# Patient Record
Sex: Male | Born: 1965
Health system: Southern US, Community
[De-identification: ages and names within clinical notes are randomized; demographics above are authoritative.]

## PROBLEM LIST (undated history)

## (undated) DIAGNOSIS — R51 Headache: Secondary | ICD-10-CM

## (undated) DIAGNOSIS — I428 Other cardiomyopathies: Secondary | ICD-10-CM

## (undated) DIAGNOSIS — I10 Essential (primary) hypertension: Secondary | ICD-10-CM

## (undated) DIAGNOSIS — K649 Unspecified hemorrhoids: Secondary | ICD-10-CM

## (undated) DIAGNOSIS — I509 Heart failure, unspecified: Secondary | ICD-10-CM

## (undated) DIAGNOSIS — E785 Hyperlipidemia, unspecified: Secondary | ICD-10-CM

## (undated) DIAGNOSIS — D649 Anemia, unspecified: Secondary | ICD-10-CM

## (undated) HISTORY — PX: HEMORRHOID SURGERY: SHX153

## (undated) HISTORY — DX: Headache: R51

## (undated) HISTORY — DX: Essential (primary) hypertension: I10

## (undated) HISTORY — DX: Hyperlipidemia, unspecified: E78.5

## (undated) HISTORY — DX: Anemia, unspecified: D64.9

---

## 1998-07-28 ENCOUNTER — Encounter: Admission: RE | Admit: 1998-07-28 | Discharge: 1998-07-28 | Payer: Self-pay | Admitting: *Deleted

## 2003-12-27 ENCOUNTER — Ambulatory Visit (HOSPITAL_BASED_OUTPATIENT_CLINIC_OR_DEPARTMENT_OTHER): Admission: RE | Admit: 2003-12-27 | Discharge: 2003-12-27 | Payer: Self-pay | Admitting: Surgery

## 2007-08-13 ENCOUNTER — Emergency Department (HOSPITAL_COMMUNITY): Admission: EM | Admit: 2007-08-13 | Discharge: 2007-08-13 | Payer: Self-pay | Admitting: Emergency Medicine

## 2009-12-05 ENCOUNTER — Ambulatory Visit: Payer: Self-pay | Admitting: Internal Medicine

## 2009-12-05 DIAGNOSIS — D649 Anemia, unspecified: Secondary | ICD-10-CM

## 2009-12-05 DIAGNOSIS — R519 Headache, unspecified: Secondary | ICD-10-CM | POA: Insufficient documentation

## 2009-12-05 DIAGNOSIS — R51 Headache: Secondary | ICD-10-CM

## 2009-12-05 DIAGNOSIS — I10 Essential (primary) hypertension: Secondary | ICD-10-CM | POA: Insufficient documentation

## 2009-12-05 HISTORY — DX: Headache: R51

## 2009-12-05 HISTORY — DX: Anemia, unspecified: D64.9

## 2009-12-05 HISTORY — DX: Essential (primary) hypertension: I10

## 2009-12-19 ENCOUNTER — Ambulatory Visit: Payer: Self-pay | Admitting: Internal Medicine

## 2010-01-09 ENCOUNTER — Ambulatory Visit: Payer: Self-pay | Admitting: Internal Medicine

## 2010-01-09 DIAGNOSIS — E785 Hyperlipidemia, unspecified: Secondary | ICD-10-CM

## 2010-01-09 HISTORY — DX: Hyperlipidemia, unspecified: E78.5

## 2010-01-09 LAB — CONVERTED CEMR LAB
BUN: 16 mg/dL (ref 6–23)
CO2: 28 meq/L (ref 19–32)
Chloride: 108 meq/L (ref 96–112)
Glucose, Bld: 102 mg/dL — ABNORMAL HIGH (ref 70–99)
Potassium: 4.4 meq/L (ref 3.5–5.1)
Sodium: 142 meq/L (ref 135–145)

## 2010-02-28 ENCOUNTER — Telehealth: Payer: Self-pay | Admitting: Internal Medicine

## 2010-04-17 ENCOUNTER — Ambulatory Visit: Payer: Self-pay | Admitting: Internal Medicine

## 2010-04-17 LAB — CONVERTED CEMR LAB
ALT: 28 units/L (ref 0–53)
Bilirubin, Direct: 0.1 mg/dL (ref 0.0–0.3)
Direct LDL: 151.6 mg/dL
HDL: 51.2 mg/dL (ref >39.00)
Total Bilirubin: 0.7 mg/dL (ref 0.3–1.2)
Triglycerides: 61 mg/dL (ref 0.0–149.0)
VLDL: 12.2 mg/dL (ref 0.0–40.0)

## 2010-04-21 ENCOUNTER — Encounter (INDEPENDENT_AMBULATORY_CARE_PROVIDER_SITE_OTHER): Payer: Self-pay | Admitting: *Deleted

## 2010-10-16 ENCOUNTER — Ambulatory Visit: Admit: 2010-10-16 | Payer: Self-pay | Admitting: Internal Medicine

## 2010-11-07 NOTE — Assessment & Plan Note (Signed)
Summary: 3 MTH FU  STC   Vital Signs:  Patient profile:   45 year old male Height:      67 inches (170.18 cm) Weight:      193.12 pounds (87.78 kg) O2 Sat:      95 % on Room air Temp:     97.8 degrees F (36.56 degrees C) oral Pulse rate:   84 / minute BP sitting:   136 / 102  (left arm) Cuff size:   large  Vitals Entered By: Orlan Leavens (April 17, 2010 8:57 AM)  O2 Flow:  Room air CC: 3 month follow-up Is Patient Diabetic? No Pain Assessment Patient in pain? no      Comments Pt states he is out of BP meds   Primary Care Provider:  Newt Lukes MD  CC:  3 month follow-up.  History of Present Illness:   Hypertension Follow-Up      This is a 45 year old man who presents for Hypertension follow-up.  last seen here 01/2010 for same - added lisinopril to amlodipine at that time.  The patient denies lightheadedness, headaches, edema, impotence, rash, and fatigue.  The patient denies the following associated symptoms: chest pain, chest pressure, exercise intolerance, dyspnea, and palpitations.  Compliance with medications (by patient report) has been near 100%.  The patient reports that dietary compliance has been fair.  The patient reports exercising occasionally.  Adjunctive measures currently used by the patient include salt restriction.    Dyslipidemia -  reports compliance with ongoing medical treatment and no changes in medication dose or frequency. denies adverse side effects related to current therapy. no muscle pain or GI upset - just begun on statin tx 01/09/2010  Clinical Review Panels:  Lipid Management   Cholesterol:  221 (01/09/2010)   HDL (good cholesterol):  61.30 (01/09/2010)  Complete Metabolic Panel   Glucose:  102 (01/09/2010)   Sodium:  142 (01/09/2010)   Potassium:  4.4 (01/09/2010)   Chloride:  108 (01/09/2010)   CO2:  28 (01/09/2010)   BUN:  16 (01/09/2010)   Creatinine:  1.1 (01/09/2010)   Calcium:  9.4 (01/09/2010)   Current Medications  (verified): 1)  Excedrin Migraine 250-250-65 Mg Tabs (Aspirin-Acetaminophen-Caffeine) .... As Needed 2)  Simvastatin 20 Mg Tabs (Simvastatin) .Marland Kitchen.. 1 By Mouth At Bedtime 3)  Amlodipine Besylate 5 Mg Tabs (Amlodipine Besylate) .Marland Kitchen.. 1 By Mouth Once Daily 4)  Lisinopril 20 Mg Tabs (Lisinopril) .Marland Kitchen.. 1 By Mouth Once Daily  Allergies (verified): 1)  ! Penicillin 2)  ! Codeine  Past History:  Past Medical History: Anemia-NOS Hypertension Dyslipidemia  Review of Systems  The patient denies chest pain, syncope, dyspnea on exertion, peripheral edema, headaches, and muscle weakness.    Physical Exam  General:  alert, well-developed, well-nourished, and cooperative to examination.   Lungs:  normal respiratory effort, no intercostal retractions or use of accessory muscles; normal breath sounds bilaterally - no crackles and no wheezes.    Heart:  normal rate, regular rhythm, no murmur, and no rub. BLE without edema. Psych:  Oriented X3, memory intact for recent and remote, normally interactive, good eye contact, not anxious appearing, not depressed appearing, and not agitated.      Impression & Recommendations:  Problem # 1:  DYSLIPIDEMIA (ICD-272.4)  started on med for same 01/2010 - reck FLP an dLFTs now - clinically, no adv SE His updated medication list for this problem includes:    Simvastatin 20 Mg Tabs (Simvastatin) .Marland Kitchen... 1 by mouth  at bedtime  Orders: TLB-Lipid Panel (80061-LIPID)    HDL:61.30 (01/09/2010)  Chol:221 (01/09/2010)  Trig:49.0 (01/09/2010)  Problem # 2:  HYPERTENSION (ICD-401.9) higher diastolic today but pt reports this not usually the case - defer med changes at now - recheck next OV His updated medication list for this problem includes:    Amlodipine Besylate 5 Mg Tabs (Amlodipine besylate) .Marland Kitchen... 1 by mouth once daily    Lisinopril 20 Mg Tabs (Lisinopril) .Marland Kitchen... 1 by mouth once daily  BP today: 136/102 Prior BP: 110/72 (01/09/2010)  Labs Reviewed: K+: 4.4  (01/09/2010) Creat: : 1.1 (01/09/2010)   Chol: 221 (01/09/2010)   HDL: 61.30 (01/09/2010)   TG: 49.0 (01/09/2010)  Complete Medication List: 1)  Excedrin Migraine 250-250-65 Mg Tabs (Aspirin-acetaminophen-caffeine) .... As needed 2)  Simvastatin 20 Mg Tabs (Simvastatin) .Marland Kitchen.. 1 by mouth at bedtime 3)  Amlodipine Besylate 5 Mg Tabs (Amlodipine besylate) .Marland Kitchen.. 1 by mouth once daily 4)  Lisinopril 20 Mg Tabs (Lisinopril) .Marland Kitchen.. 1 by mouth once daily  Other Orders: TLB-Hepatic/Liver Function Pnl (80076-HEPATIC)  Patient Instructions: 1)  it was good to see you today. 2)  test(s) ordered today - your results will be posted on the phone tree for review in 48-72 hours from the time of test completion; call [phone] and enter your 9 digit MRN (listed above on this page, just below your name); if any changes need to be made or there are abnormal results, you will be contacted directly. 3)  no medicine changes recommended today - refills done as requested - 4)  Please schedule a follow-up appointment in 6 months, sooner if problems.  will check cholesterol, liver and other labs at that time so come in fasting Prescriptions: LISINOPRIL 20 MG TABS (LISINOPRIL) 1 by mouth once daily  #30 x 5   Entered by:   Orlan Leavens   Authorized by:   Newt Lukes MD   Signed by:   Orlan Leavens on 04/17/2010   Method used:   Electronically to        [facility]  [phone]* (retail)       [address]       [place], [place]  [address]       Ph: [phone] or [phone]       Fax: [phone]   RxID:   [id number] AMLODIPINE BESYLATE 5 MG TABS (AMLODIPINE BESYLATE) 1 by mouth once daily  #30 x 5   Entered by:   Orlan Leavens   Authorized by:   Newt Lukes MD   Signed by:   Orlan Leavens on 04/17/2010   Method used:   Electronically to        [facility]  [phone]* (retail)       [address]       [place], [place]   [address]       Ph: [phone] or [phone]       Fax: [phone]   RxID:   [id number]

## 2010-11-07 NOTE — Assessment & Plan Note (Signed)
Summary: PER PT 3 WK FU  STC   Vital Signs:  Patient profile:   45 year old male Height:      67 inches (170.18 cm) Weight:      196.8 pounds (89.45 kg) O2 Sat:      96 % on Room air Temp:     97.9 degrees F (36.61 degrees C) oral Pulse rate:   91 / minute BP sitting:   110 / 72  (left arm) Cuff size:   large  Vitals Entered By: Orlan Leavens (January 09, 2010 9:10 AM)  O2 Flow:  Room air CC: 3 week follow-up Is Patient Diabetic? No Pain Assessment Patient in pain? no        Primary Care Provider:  Newt Lukes MD  CC:  3 week follow-up.  History of Present Illness:   Hypertension Follow-Up      This is a 45 year old man who presents for Hypertension follow-up.  last seen here 3 weeks ago for same - added lisinopril to amlodipine at that time.  The patient denies lightheadedness, headaches, edema, impotence, rash, and fatigue.  The patient denies the following associated symptoms: chest pain, chest pressure, exercise intolerance, dyspnea, and palpitations.  Compliance with medications (by patient report) has been near 100%.  The patient reports that dietary compliance has been good.  The patient reports exercising occasionally.  Adjunctive measures currently used by the patient include salt restriction.    Clinical Review Panels:  Immunizations   Last Tetanus Booster:  Tdap (12/05/2009)   Current Medications (verified): 1)  Amlodipine Besylate 5 Mg Tabs (Amlodipine Besylate) .Marland Kitchen.. 1 By Mouth Once Daily 2)  Excedrin Migraine 250-250-65 Mg Tabs (Aspirin-Acetaminophen-Caffeine) .... As Needed 3)  Lisinopril 20 Mg Tabs (Lisinopril) .Marland Kitchen.. 1 By Mouth Once Daily  Allergies (verified): 1)  ! Penicillin 2)  ! Codeine  Past History:  Past Medical History: Anemia-NOS Hypertension  Review of Systems  The patient denies fever, chest pain, and headaches.    Physical Exam  General:  alert, well-developed, well-nourished, and cooperative to examination.   Lungs:  normal  respiratory effort, no intercostal retractions or use of accessory muscles; normal breath sounds bilaterally - no crackles and no wheezes.    Heart:  normal rate, regular rhythm, no murmur, and no rub. BLE without edema. Neurologic:  alert & oriented X3 and cranial nerves II-XII symetrically intact.  strength normal in all extremities, sensation intact to light touch, and gait normal. speech fluent without dysarthria or aphasia; follows commands with good comprehension.    Impression & Recommendations:  Problem # 1:  HYPERTENSION (ICD-401.9) Assessment Improved  The following medications were removed from the medication list:    Lisinopril 20 Mg Tabs (Lisinopril) .Marland Kitchen... 1 by mouth once daily His updated medication list for this problem includes:    Amlodipine Besy-benazepril Hcl 5-20 Mg Caps (Amlodipine besy-benazepril hcl) .Marland Kitchen... 1 by mouth once daily  BP today: 110/72 Prior BP: 140/100 (12/19/2009)  Orders: TLB-Lipid Panel (80061-LIPID) TLB-BMP (Basic Metabolic Panel-BMET) (80048-METABOL)  Complete Medication List: 1)  Amlodipine Besy-benazepril Hcl 5-20 Mg Caps (Amlodipine besy-benazepril hcl) .Marland Kitchen.. 1 by mouth once daily 2)  Excedrin Migraine 250-250-65 Mg Tabs (Aspirin-acetaminophen-caffeine) .... As needed  Patient Instructions: 1)  it was good to see you today. 2)  will change your blood pressure pills to one combo pill as discussed - your prescription has been electronically submitted to your pharmacy. Please take as directed. Contact our office if you believe you're having  problems with the medication(s).  3)  test(s) ordered today - your results will be posted on the phone tree for review in 48-72 hours from the time of test completion; call [phone] and enter your 9 digit MRN (listed above on this page, just below your name); if any changes need to be made or there are abnormal results, you will be contacted directly. 4)  Please schedule a follow-up appointment in 3 months to  monitor blood pressure, sooner if problems.  Prescriptions: AMLODIPINE BESY-BENAZEPRIL HCL 5-20 MG CAPS (AMLODIPINE BESY-BENAZEPRIL HCL) 1 by mouth once daily  #30 x 5   Entered and Authorized by:   Newt Lukes MD   Signed by:   Newt Lukes MD on 01/09/2010   Method used:   Electronically to        [facility]  [phone]* (retail)       [address]       [place], [place]  [address]       Ph: [phone] or [phone]       Fax: [phone]   RxID:   [id number]

## 2010-11-07 NOTE — Letter (Signed)
Summary: Lipid Letter  [facility]  [address] [place], [place] [address]   Phone: [phone]  Fax: [phone]    04/21/2010  Nathaniel Carr [address] [place], [place]  [address]  Dear [name]: Jamal Maes to contact you several times concerning lab test that was done on 04/17/10. We have carefully reviewed your last lipid  and the results are noted below with a summary of recommendations for lipid management. Cholestrol not really any different than 3 month s ago when we previoulsy check. Need to make sure if you have been taking cholestrol med (simvastatin) consistantly, if so will need to increase dosage to 40mg  nightly. Please contact us at your earliest convience to let us know if you was taking med that way we can send new rx for the 40mg  to your pharmacy. Also lab show that the liver functions is fine.     Cholesterol:       217       HDL "good" Cholesterol:   96.29       LDL "bad" Cholesterol:   12.2       Triglycerides:       61.0      Attached a copy of lab report for your records!!      Current Medications: 1)    Excedrin Migraine 250-250-65 Mg Tabs (Aspirin-acetaminophen-caffeine) .... As needed 2)    Simvastatin 20 Mg Tabs (Simvastatin) .Marland Kitchen.. 1 by mouth at bedtime 3)    Amlodipine Besylate 5 Mg Tabs (Amlodipine besylate) .Marland Kitchen.. 1 by mouth once daily 4)    Lisinopril 20 Mg Tabs (Lisinopril) .Marland Kitchen.. 1 by mouth once daily  If you have any questions, please call. We appreciate being able to work with you.   Sincerely,    [facility] Samara Snide for Newt Lukes MD

## 2010-11-07 NOTE — Assessment & Plan Note (Signed)
Summary: NEW [employer] PT--PKG--#--STC   Vital Signs:  Patient profile:   45 year old male Height:      67 inches (170.18 cm) Weight:      194.12 pounds (88.24 kg) BMI:     30.51 O2 Sat:      97 % on Room air Temp:     98.2 degrees F (36.78 degrees C) oral Pulse rate:   90 / minute BP sitting:   148 / 112  (left arm) Cuff size:   large  Vitals Entered By: Orlan Leavens (December 05, 2009 8:37 AM)  O2 Flow:  Room air CC: New patient Is Patient Diabetic? No Pain Assessment Patient in pain? no        Primary Care Provider:  Newt Lukes MD  CC:  New patient.  History of Present Illness: new pt to me and our practice 0- here to est care -  newly discovered high blood pressure - told of same at physical screening 2 weeks ago- denies prior hx of HTN - no symptoms - specifically no change in HA, no CP, SOB, vision changes or edema denies weight changes (up or down) no daily NSAID use - has not yet tried DASH diet or Na restriction does not check BP at home  hx migraines - occurs 2-3x/mo - symptoms controlled with excerdrin migraine use   Preventive Screening-Counseling & Management  Alcohol-Tobacco     Alcohol drinks/day: 0     Alcohol Counseling: not indicated; patient does not drink     Smoking Status: never     Tobacco Counseling: not indicated; no tobacco use  Caffeine-Diet-Exercise     Does Patient Exercise: yes     Depression Counseling: not indicated; screening negative for depression  Safety-Violence-Falls     Seat Belt Use: yes     Helmet Use: yes     Firearms in the Home: no firearms in the home     Smoke Detectors: yes     Violence in the Home: no risk noted  Clinical Review Panels:  Immunizations   Last Tetanus Booster:  Tdap (12/05/2009)   Current Medications (verified): 1)  None  Allergies (verified): 1)  ! Penicillin 2)  ! Codeine  Past History:  Past Medical History: Anemia-NOS Hypertension  Past Surgical History: Denies  surgical history  Family History: mom - A&W dad - A&W, migraine hx pGF - HTN  no CVA or CAD or CKD  Social History: Never Smoked no alcohol married, lives with spouse works as Production designer, theatre/television/film at [employer] Smoking Status:  never Does Patient Exercise:  yes Risk analyst Use:  yes  Review of Systems       see HPI above. I have reviewed all other systems and they were negative.   Physical Exam  General:  alert, well-developed, well-nourished, and cooperative to examination.   mom at side Eyes:  vision grossly intact; pupils equal, round and reactive to light.  conjunctiva and lids normal.    Neck:  supple, full ROM, no masses, no thyromegaly; no thyroid nodules or tenderness. no JVD or carotid bruits.   Chest Wall:  No deformities, masses, tenderness or gynecomastia noted. Lungs:  normal respiratory effort, no intercostal retractions or use of accessory muscles; normal breath sounds bilaterally - no crackles and no wheezes.    Heart:  normal rate, regular rhythm, no murmur, and no rub. BLE without edema. normal DP pulses and normal cap refill in all 4 extremities    Abdomen:  soft, non-tender,  normal bowel sounds, no distention; no masses and no appreciable hepatomegaly or splenomegaly.   Msk:  No deformity or scoliosis noted of thoracic or lumbar spine.   Neurologic:  alert & oriented X3 and cranial nerves II-XII symetrically intact.  strength normal in all extremities, sensation intact to light touch, and gait normal. speech fluent without dysarthria or aphasia; follows commands with good comprehension.  Skin:  no rashes, vesicles, ulcers, or erythema. No nodules or irregularity to palpation.  Psych:  Oriented X3, memory intact for recent and remote, normally interactive, good eye contact, not anxious appearing, not depressed appearing, and not agitated.      Impression & Recommendations:  Problem # 1:  HYPERTENSION (ICD-401.9)  will start med at this time (verified high BP opn manual  recehck and hx high BP 2 weeks ago prompting this visit) advised on poss SE and risk v. benefit no symptoms evidenent at thsi time labs recently done per pt including chem and lipids - will send for copies - repeat next OV if unable to obatin records EKG today as not done at prior visit-- early LVH changes - no ischemic change noted - also advised weight control and DASH diet reck 2-4 weeks for med titration and review as needed   His updated medication list for this problem includes:    Amlodipine Besylate 5 Mg Tabs (Amlodipine besylate) .Marland Kitchen... 1 by mouth once daily  Orders: EKG w/ Interpretation (93000) Prescription Created Electronically [phone])  BP today: 148/112  Problem # 2:  HEADACHE (ICD-784.0)  to monitor NSAID use (reports <1/wk) no bneuro deficits -  feels satisifed with current symptoms control so no proplactic meds needed at this time  His updated medication list for this problem includes:    Excedrin Migraine 250-250-65 Mg Tabs (Aspirin-acetaminophen-caffeine) .Marland Kitchen... As needed  Complete Medication List: 1)  Amlodipine Besylate 5 Mg Tabs (Amlodipine besylate) .Marland Kitchen.. 1 by mouth once daily 2)  Excedrin Migraine 250-250-65 Mg Tabs (Aspirin-acetaminophen-caffeine) .... As needed  Other Orders: Tdap => 45yrs IM (32951) Admin 1st Vaccine (88416)  Patient Instructions: 1)  it was good to see you today. 2)  exam and EKG look good today -  3)  will start new medication for high blood pressure as discussed - amlodipine. your prescription has been electronically submitted to your pharmacy. Please take as directed. Contact our office if you believe you're having problems with the medication(s).  4)  will send for copies of labs recently done to review and incorporate into our records here - 5)  Please schedule a follow-up appointment in 2-4 weeks to recheck blood pressure and adjust medications as needed, sooner if problems.  Prescriptions: AMLODIPINE BESYLATE 5 MG TABS  (AMLODIPINE BESYLATE) 1 by mouth once daily  #30 x 1   Entered and Authorized by:   Newt Lukes MD   Signed by:   Newt Lukes MD on 12/05/2009   Method used:   Electronically to        [facility]  [phone]* (retail)       [address]       [place], [place]  [address]       Ph: [phone] or [phone]       Fax: [phone]   RxID:   [id number]    Immunizations Administered:  Tetanus Vaccine:    Vaccine Type: Tdap    Site: left deltoid    Mfr: GlaxoSmithKline    Dose:  0.5 ml    Route: IM    Given by: Orlan Leavens    Exp. Date: 12/03/2011    Lot #: [id number]    VIS given: 12/05/09

## 2010-11-07 NOTE — Assessment & Plan Note (Signed)
Summary: 2-4 WK FU---STC   Vital Signs:  Patient profile:   45 year old male Height:      67 inches (170.18 cm) Weight:      191.8 pounds (87.18 kg) O2 Sat:      97 % on Room air Temp:     97.3 degrees F (36.28 degrees C) oral Pulse rate:   85 / minute BP sitting:   140 / 100  (left arm) Cuff size:   large  Vitals Entered By: Orlan Leavens (December 19, 2009 10:49 AM)  O2 Flow:  Room air CC: 2 week follow-up Is Patient Diabetic? No Pain Assessment Patient in pain? no       8  Primary Care Provider:  Newt Lukes MD  CC:  2 week follow-up.  History of Present Illness:   Hypertension Follow-Up      This is a 45 year old man who presents for Hypertension follow-up.  last seen here 2 weeks ago for same - begun on amlodipine at that time.  The patient denies lightheadedness, headaches, edema, impotence, rash, and fatigue.  The patient denies the following associated symptoms: chest pain, chest pressure, exercise intolerance, dyspnea, and palpitations.  Compliance with medications (by patient report) has been near 100%.  The patient reports that dietary compliance has been good.  The patient reports exercising occasionally.  Adjunctive measures currently used by the patient include salt restriction.    Current Medications (verified): 1)  Amlodipine Besylate 5 Mg Tabs (Amlodipine Besylate) .Marland Kitchen.. 1 By Mouth Once Daily 2)  Excedrin Migraine 250-250-65 Mg Tabs (Aspirin-Acetaminophen-Caffeine) .... As Needed  Allergies (verified): 1)  ! Penicillin 2)  ! Codeine  Past History:  Past Medical History: Reviewed history from 12/05/2009 and no changes required. Anemia-NOS Hypertension  Review of Systems  The patient denies vision loss, chest pain, syncope, peripheral edema, and abdominal pain.    Physical Exam  General:  alert, well-developed, well-nourished, and cooperative to examination.   mom at side Lungs:  normal respiratory effort, no intercostal retractions or use of  accessory muscles; normal breath sounds bilaterally - no crackles and no wheezes.    Heart:  normal rate, regular rhythm, no murmur, and no rub. BLE without edema. Neurologic:  alert & oriented X3 and cranial nerves II-XII symetrically intact.  strength normal in all extremities, sensation intact to light touch, and gait normal. speech fluent without dysarthria or aphasia; follows commands with good comprehension.    Impression & Recommendations:  Problem # 1:  HYPERTENSION (ICD-401.9)  still sub op cntrol - add second agent -  close f/u - reiterated DSAH diet and need for labs if no info here by time of next OV His updated medication list for this problem includes:    Amlodipine Besylate 5 Mg Tabs (Amlodipine besylate) .Marland Kitchen... 1 by mouth once daily    Lisinopril 20 Mg Tabs (Lisinopril) .Marland Kitchen... 1 by mouth once daily  BP today: 140/100 Prior BP: 148/112 (12/05/2009)  Orders: Prescription Created Electronically [phone])  Complete Medication List: 1)  Amlodipine Besylate 5 Mg Tabs (Amlodipine besylate) .Marland Kitchen.. 1 by mouth once daily 2)  Excedrin Migraine 250-250-65 Mg Tabs (Aspirin-acetaminophen-caffeine) .... As needed 3)  Lisinopril 20 Mg Tabs (Lisinopril) .Marland Kitchen.. 1 by mouth once daily  Patient Instructions: 1)  it was good to see you today. 2)  since blood pressure still high, will add another medication - lisinopril - take this in addition to amlodipine as before - 3)  your prescriptions have been electronically  submitted to your pharmacy. Please take as directed. Contact our office if you believe you're having problems with the medication(s). 4)  Please schedule a follow-up appointment in 2-4 weeks, sooner if problems. We may do labs at next visit depending on if we have recieved information from your last labwork or not - Prescriptions: LISINOPRIL 20 MG TABS (LISINOPRIL) 1 by mouth once daily  #30 x 3   Entered and Authorized by:   Newt Lukes MD   Signed by:   Newt Lukes MD  on 12/19/2009   Method used:   Electronically to        [facility]  [phone]* (retail)       [address]       [place], [place]  [address]       Ph: [phone] or [phone]       Fax: [phone]   RxID:   [id number]

## 2010-11-07 NOTE — Progress Notes (Signed)
Summary: med change  Phone Note From Pharmacy   Caller: [facility]  [address]  [phone]* Call For: Dr. Felicity Coyer  Reason for Call: Patient requests substitution Summary of Call: Recieved fax from pharmacy stating have script for Amlodipine/Benazepril 5/20mg . Pt requesting to go back on plain amlodipine. He take it along with his Lisinopril. He states med is too expensive. Pls advise Initial call taken by: Orlan Leavens,  Feb 28, 2010 11:06 AM  Follow-up for Phone Call        will change to separate meds - see med list - may erx as needed or call in - thanks Follow-up by: Newt Lukes MD,  Feb 28, 2010 11:25 AM    New/Updated Medications: AMLODIPINE BESYLATE 5 MG TABS (AMLODIPINE BESYLATE) 1 by mouth once daily LISINOPRIL 20 MG TABS (LISINOPRIL) 1 by mouth once daily Prescriptions: LISINOPRIL 20 MG TABS (LISINOPRIL) 1 by mouth once daily  #30 x 2   Entered by:   Orlan Leavens   Authorized by:   Newt Lukes MD   Signed by:   Orlan Leavens on 02/28/2010   Method used:   Electronically to        [facility]  [phone]* (retail)       [address]       [place], [place]  [address]       Ph: [phone] or [phone]       Fax: [phone]   RxID:   [id number] AMLODIPINE BESYLATE 5 MG TABS (AMLODIPINE BESYLATE) 1 by mouth once daily  #30 x 2   Entered by:   Orlan Leavens   Authorized by:   Newt Lukes MD   Signed by:   Orlan Leavens on 02/28/2010   Method used:   Electronically to        [facility]  [phone]* (retail)       [address]       [place], [place]  [address]       Ph: [phone] or [phone]       Fax: [phone]   RxID:   [id number]

## 2011-02-23 NOTE — Op Note (Signed)
NAME:  Nathaniel Carr, Nathaniel Carr NO.:  [id number]   MEDICAL RECORD NO.:  [id number]                   PATIENT TYPE:  AMB   LOCATION:  DSC                                  FACILITY:  [facility]   PHYSICIAN:  Abigail Miyamoto, M.D.              DATE OF BIRTH:  [birth year]   DATE OF PROCEDURE:  12/27/2003  DATE OF DISCHARGE:                                 OPERATIVE REPORT   PREOPERATIVE DIAGNOSIS:  Left face mass.   POSTOPERATIVE DIAGNOSIS:  Left face sebaceous cyst.   OPERATION PERFORMED:  Excision of left face mass.   SURGEON:  Douglas A. Magnus Ivan, M.D.   ANESTHESIA:  1% lidocaine with epinephrine and monitored anesthesia care.   ESTIMATED BLOOD LOSS:  Minimal.   DESCRIPTION OF PROCEDURE:  The patient was brought to the operating room and  identified as Nathaniel Carr.  He was placed supine on the operating  table and anesthesia was induced.  His left face was then prepped and draped  in the usual sterile fashion.  The skin overlying the large palpable mass on  the left side of the face was then anesthetized with 1% lidocaine with  epinephrine.  The mass was found to be approximately 5 cm in size.  A  transverse incision was made across the mass and then was taken down to the  mass with the electrocautery.  The mass appeared to be consistent with a  sebaceous cyst and a large amount of sebaceous debris was excised out of the  cyst.  The cyst capsule was removed in its entirety. The wound was then  irrigated with saline.  The skin was then closed with interrupted 3-0 Vicryl  sutures and Steri-Strips.  Gauze was then applied.  The patient tolerated  the procedure well.  All sponge, needle and instrument counts were correct  at the end of the procedure.  The patient was then taken in stable condition  from the operating room to the recovery room.                                               Abigail Miyamoto, M.D.    DB/MEDQ  D:  12/27/2003  T:   12/28/2003  Job:  [id number]

## 2011-04-09 ENCOUNTER — Encounter: Payer: Self-pay | Admitting: Internal Medicine

## 2012-07-07 ENCOUNTER — Ambulatory Visit (INDEPENDENT_AMBULATORY_CARE_PROVIDER_SITE_OTHER): Payer: Self-pay | Admitting: General Surgery

## 2012-07-21 ENCOUNTER — Ambulatory Visit (INDEPENDENT_AMBULATORY_CARE_PROVIDER_SITE_OTHER): Payer: Self-pay | Admitting: General Surgery

## 2012-08-05 ENCOUNTER — Encounter (INDEPENDENT_AMBULATORY_CARE_PROVIDER_SITE_OTHER): Payer: Self-pay | Admitting: General Surgery

## 2015-05-14 ENCOUNTER — Encounter (HOSPITAL_COMMUNITY): Payer: Self-pay | Admitting: Emergency Medicine

## 2015-05-14 ENCOUNTER — Emergency Department (HOSPITAL_COMMUNITY)
Admission: EM | Admit: 2015-05-14 | Discharge: 2015-05-14 | Disposition: A | Discharge status: Home / Self Care | Payer: BLUE CROSS/BLUE SHIELD | Attending: Emergency Medicine | Admitting: Emergency Medicine

## 2015-05-14 DIAGNOSIS — Z880 Allergy status to penicillin: Secondary | ICD-10-CM | POA: Diagnosis not present

## 2015-05-14 DIAGNOSIS — J069 Acute upper respiratory infection, unspecified: Secondary | ICD-10-CM | POA: Diagnosis not present

## 2015-05-14 DIAGNOSIS — Z79899 Other long term (current) drug therapy: Secondary | ICD-10-CM | POA: Insufficient documentation

## 2015-05-14 DIAGNOSIS — Z862 Personal history of diseases of the blood and blood-forming organs and certain disorders involving the immune mechanism: Secondary | ICD-10-CM | POA: Insufficient documentation

## 2015-05-14 DIAGNOSIS — B9789 Other viral agents as the cause of diseases classified elsewhere: Secondary | ICD-10-CM

## 2015-05-14 DIAGNOSIS — J029 Acute pharyngitis, unspecified: Secondary | ICD-10-CM | POA: Diagnosis present

## 2015-05-14 DIAGNOSIS — I10 Essential (primary) hypertension: Secondary | ICD-10-CM | POA: Insufficient documentation

## 2015-05-14 DIAGNOSIS — E785 Hyperlipidemia, unspecified: Secondary | ICD-10-CM | POA: Insufficient documentation

## 2015-05-14 LAB — RAPID STREP SCREEN (MED CTR MEBANE ONLY): Streptococcus, Group A Screen (Direct): NEGATIVE

## 2015-05-14 MED ORDER — LIDOCAINE VISCOUS 2 % MT SOLN: 20.0000 mL | OROMUCOSAL | Status: DC | PRN

## 2015-05-14 NOTE — ED Notes (Signed)
Pt states he has had headache, cough, sore throat since Monday. Pt states now it is pain to swallow. Pt also has non-productive cough. Pt has been taking Dayquil and Motrin for symptoms, but it is not helping much. States symptoms worse at night. Pt has no acute distress. Skin warm, dry.

## 2015-05-14 NOTE — ED Provider Notes (Signed)
CSN: [id number]     Arrival date & time 05/14/15  1547 History   First MD Initiated Contact with Patient 05/14/15 1551     Chief Complaint  Patient presents with  . URI  . Sore Throat     (Consider location/radiation/quality/duration/timing/severity/associated sxs/prior Treatment) Patient is a 49 y.o. male presenting with URI and pharyngitis. The history is provided by the patient and medical records.  URI Presenting symptoms: congestion, cough and sore throat   Associated symptoms: headaches   Sore Throat Associated symptoms include congestion, coughing, headaches and a sore throat.    This is a 49 year old male with history of anemia, dyslipidemia, hypertension, presenting to the ED for URI type symptoms for the past 6 days.  Specifically patient has had dry cough, nasal congestion, sore throat, and generalized headache. He denies any fever, sweats, or chills. No chest pain or shortness of breath. No dizziness, numbness, or weakness.no abdominal pain, nausea, or vomiting.No known sick contacts. Patient states symptoms do seem worse at night. He has been taking DayQuil and over-the-counter Motrin without relief.  VSS.  Past Medical History  Diagnosis Date  . ANEMIA-NOS 12/05/2009  . DYSLIPIDEMIA 01/09/2010  . Headache(784.0) 12/05/2009  . HYPERTENSION 12/05/2009   History reviewed. No pertinent past surgical history. Family History  Problem Relation Age of Onset  . Hypertension Paternal Grandfather    History  Substance Use Topics  . Smoking status: Never Smoker   . Smokeless tobacco: Not on file     Comment: Married, lives with spouse. Works as Pension scheme manager  . Alcohol Use: No    Review of Systems  HENT: Positive for congestion and sore throat.   Respiratory: Positive for cough.   Neurological: Positive for headaches.  All other systems reviewed and are negative.     Allergies  Codeine and Penicillins  Home Medications   Prior to Admission medications    Medication Sig Start Date End Date Taking? Authorizing Provider  amLODipine (NORVASC) 5 MG tablet Take 5 mg by mouth daily.      Historical Provider, MD  aspirin-acetaminophen-caffeine (EXCEDRIN MIGRAINE) [phone] MG per tablet Take 1 tablet by mouth as needed.      Historical Provider, MD  lisinopril (PRINIVIL,ZESTRIL) 20 MG tablet Take 20 mg by mouth daily.      Historical Provider, MD  simvastatin (ZOCOR) 20 MG tablet Take 20 mg by mouth at bedtime.      Historical Provider, MD   BP 132/77 mmHg  Pulse 110  Temp(Src) 98.7 F (37.1 C) (Oral)  Resp 17  SpO2 98%   Physical Exam  Constitutional: He is oriented to person, place, and time. He appears well-developed and well-nourished. No distress.  HENT:  Head: Normocephalic and atraumatic.  Right Ear: Tympanic membrane and ear canal normal.  Left Ear: Tympanic membrane and ear canal normal.  Nose: Nose normal.  Mouth/Throat: Uvula is midline and mucous membranes are normal. Posterior oropharyngeal erythema (mild) present.  Eyes: Conjunctivae and EOM are normal. Pupils are equal, round, and reactive to light.  Neck: Normal range of motion. Neck supple.  Cardiovascular: Normal rate, regular rhythm and normal heart sounds.   Pulmonary/Chest: Effort normal and breath sounds normal. No respiratory distress. He has no wheezes.  Musculoskeletal: Normal range of motion.  Neurological: He is alert and oriented to person, place, and time.  Skin: Skin is warm and dry. He is not diaphoretic.  Psychiatric: He has a normal mood and affect.  Nursing note and vitals reviewed.  ED Course  Procedures (including critical care time) Labs Review Labs Reviewed  RAPID STREP SCREEN (NOT AT [facility])  CULTURE, GROUP A STREP    Imaging Review No results found.   EKG Interpretation None      MDM   Final diagnoses:  Viral URI with cough   49 y.o. M here with 6 days of URI symptoms.  No chest pain or SOB.  Patient afebrile, non-toxic in  appearance.  Exam is overall non-infectious, no cough noted.  Lungs are clear bilaterally.  Slight oropharyngeal edema without noted exudates.  Handling secretions well, airway clear.  Rapid strep sent, negative for acute findings.  Culture pending.  Suspect constellation of symptoms viral in nature.  Will d/c home with supportive care.  Follow-up with PCP.  Discussed plan with patient, he/she acknowledged understanding and agreed with plan of care.  Return precautions given for new or worsening symptoms.  Garlon Hatchet, PA-C 05/14/15 1703  Richardean Canal, MD 05/14/15 [phone]

## 2015-05-14 NOTE — Discharge Instructions (Signed)
Take the prescribed medication as directed. Follow-up with your primary care physician. Return to the ED for new or worsening symptoms.   Emergency Department Resource Guide 1) Find a Doctor and Pay Out of Pocket Although you won't have to find out who is covered by your insurance plan, it is a good idea to ask around and get recommendations. You will then need to call the office and see if the doctor you have chosen will accept you as a new patient and what types of options they offer for patients who are self-pay. Some doctors offer discounts or will set up payment plans for their patients who do not have insurance, but you will need to ask so you aren't surprised when you get to your appointment.  2) Contact Your Local Health Department Not all health departments have doctors that can see patients for sick visits, but many do, so it is worth a call to see if yours does. If you don't know where your local health department is, you can check in your phone book. The CDC also has a tool to help you locate your state's health department, and many state websites also have listings of all of their local health departments.  3) Find a Walk-in Clinic If your illness is not likely to be very severe or complicated, you may want to try a walk in clinic. These are popping up all over the country in pharmacies, drugstores, and shopping centers. They're usually staffed by nurse practitioners or physician assistants that have been trained to treat common illnesses and complaints. They're usually fairly quick and inexpensive. However, if you have serious medical issues or chronic medical problems, these are probably not your best option.  No Primary Care Doctor: - Call Health Connect at  [phone] - they can help you locate a primary care doctor that  accepts your insurance, provides certain services, etc. - Physician Referral Service- [phone]  Chronic Pain Problems: Organization         Address  Phone    Notes  [facility]  [phone] Patients need to be referred by their primary care doctor.   Medication Assistance: Organization         Address  Phone   Notes  [facility] Medication [facility] [address] [place] [place], [place] [address] [phone] --Must be a resident of [facility] -- Must have NO insurance coverage whatsoever (no Medicaid/ Medicare, etc.) -- The pt. MUST have a primary care doctor that directs their care regularly and follows them in the community   MedAssist  [phone]   [employer]  [phone]    Agencies that provide inexpensive medical care: Organization         Address  Phone   Notes  [facility]  [phone]   [facility]    [phone]   [facility] [address] [place], [place] [address] [phone]   [facility] [address] [place] [place] [phone]   Planned Parenthood    [phone]   [facility]    [phone]   [facility]  [address], [place] Phone:  [phone], Fax:  [phone] Hours of Operation:  9 am - 6 pm, M-F.  Also accepts Medicaid/Medicare and self-pay.  [facility]  [address], [place] Phone: [phone], Fax: [phone]. Hours of Operation:  8:30 am - 5:30 pm, M-F.  Also accepts Medicaid and self-pay.  [facility] [place] [address] [place] Phone: [phone]   [place], [place], [place] [phone], Ext. [phone] Mondays & Thursdays: 7-9 AM.  First 15 patients are seen on a first come, first serve basis.    [place] Providers:  Organization         Address  Phone   Notes  [facility] [address], [place] [phone] Also accepts self-pay patients.  [facility]  [address], [place]  [phone]   [place], [address], [place] [phone]   [facility] [address] [place] [phone]   Lucianne Lei [address], [place]   [phone] Only accepts [place] Access [place] patients after they have their name applied to their card.   Self-Pay (no insurance) in [facility]:  Organization         Address  Phone   Notes  Sickle Cell Patients, [facility] [place] [phone]   [facility]gent Care [place] [phone]   [facility] [place]  [place], [place], [place] [phone]   [facility]/Dr. Osei-Bonsu  [address], [place] or [address], [place] [phone] Phone number for both [place] and [place] locations is the same.  Urgent Medical and [facility] [address] [place] [phone]   [facility] [address] [place] or [address] [phone]   [facility] [address] [place] [phone], phone; [phone], fax Sees patients 1st and 3rd Saturday of every month.  Must not qualify for public or private insurance (i.e. Medicaid, Medicare, [place] Health Choice, Veterans' Benefits)  Household income should be no more than 200% of the poverty level The clinic cannot treat you if you are pregnant or think you are pregnant  Sexually transmitted diseases are not treated at the clinic.    Dental Care: Organization         Address  Phone  Notes  [facility] [place] [phone] Accepts children up to age 31 who are enrolled in [place] or [place]; pregnant women with a Medicaid card; and children who have  applied for Medicaid or [place] Health Choice, but were declined, whose parents can pay a reduced fee at time of service.  [facility]  [address], [place] [phone] Accepts children up to age 41 who are enrolled in [place] or [place]; pregnant women with a Medicaid card; and children who have applied for Medicaid or [place] Health Choice, but were declined, whose parents can pay a reduced fee at time of service.  [facility]  [place] [phone] Patients are seen by appointment only. Walk-ins are not accepted. [place] will see patients 30 years of age and older. Monday - Tuesday (8am-5pm) Most Wednesdays (8:30-5pm) $30 per visit, cash only  [facility] [employer] [facility]  [address], [employer] [phone])  [phone] Patients are seen by appointment only. Walk-ins are not accepted. [place] will see patients 2 years of age and older. One Wednesday Evening (Monthly: Volunteer Based).  $30 per visit, cash only  [place]  [phone] for adults; Children under age 37, call Graduate Pediatric Dentistry at [phone]. Children aged 25-14, please call [phone] to request a pediatric application.  Dental services are provided in all areas of dental care including fillings, crowns and bridges, complete and partial dentures, implants, gum treatment, root canals, and extractions. Preventive care is also provided. Treatment is provided to both adults and children. Patients are selected via a lottery and there is often a waiting list.   [facility] [address], [place]  [phone] [email or web address]   [facility] [address] [place], [place] [phone], Ext. [phone] Second and Fourth Thursday of each month, opens at 6:30 AM; Clinic ends at 9 AM.  Patients are seen on a first-come first-served basis, and a  limited number are seen during each clinic.   [facility]  [address] Hillard Danker [place], [place] [phone]   Eligibility Requirements You must have lived in [place], [place], or [place] counties for at least the last three months.   You cannot be eligible for state or federal sponsored [employer], including [employer], [place], or [employer].   You generally cannot be eligible for healthcare insurance through your employer.    How to apply: Eligibility screenings are held every Tuesday and Wednesday afternoon from 1:00 pm until 4:00 pm. You do not need an appointment for the interview!  [facility] [address] [place], [place]   [place]  [place] Department  [place]  [phone]    Behavioral Health Resources in the Community: Intensive Outpatient Programs Organization         Address  Phone  Notes  [place]. [address], [place], [place] [phone]   [facility] [address], [place], [place]   [facility]: [facility] [address], [place], [place]   [place] [address]  [place], [place] or [phone]   Substance Abuse Resources Organization         Address  Phone  Notes  Alcohol and Drug Services  4056175404   [place]  [phone]   [facility] [place]   Chinita Pester  [phone]   Residential & Outpatient Substance Abuse Program  [phone]   Psychological Services Organization         Address  Phone  Notes  [facility]  [place]  [phone]   [place] [address] [place] or [phone]    Mobile Crisis Teams Organization          Address  Phone  Notes  [facility], Mobile Crisis Care Unit  [phone]   [facility]  [address] [place], [place]   [place] [address] [place] [phone]    Self-Help/Support Groups Organization         Address  Phone             Notes  [place]. of [place] - variety of support groups  [phone]  [phone] Call for more information  Narcotics Anonymous (NA), [facility] [address], [employer] [place]  2 meetings at this location   Residential Facilities manager         Address  Phone  Notes  [facility] [place],    [place]  [phone]   [facility]  [address] [place] [address], [place], [place]   [place], [place] [phone] Admissions: 8am-3pm M-F  Incentives Substance [place] [address]    [place], [place] [phone]   [facility] [address] [place], [place], [place]   [facility] [address]  [place], [place]   [facility] - Intensive Outpatient [address], [place], [place]   [facility] ([place] [address].,  [place], [place] [phone] or [phone]   [facility] Services (RTS) [address] [place], [place] Accepts Medicaid  [facility] [place] [address]  [place] 1-5144220242 Substance Abuse/Addiction Treatment   [facility] Organization         Address  Phone  Notes  [facility]  [phone]   Domenic Schwab, PhD [address] [place]lis [place], [place]   [phone] or [phone]   [place], [place] [phone]   [facility] [address] [place], [place] [phone] Insurance/Medicaid/sponsorship  through [facility] and Families [address] [place]                                    [place], [place] [phone] [place] [address], [place] [phone]    Dr. Adele Schilder  [phone]   [facility] Dept. 1) [address] [place] 2) [place] 3)  [place] [address], [place] [phone]  [phone]   [place] [phone] or [phone] (After Hours)

## 2015-05-16 LAB — CULTURE, GROUP A STREP: STREP A CULTURE: NEGATIVE

## 2015-11-16 ENCOUNTER — Encounter (HOSPITAL_COMMUNITY): Payer: Self-pay

## 2015-11-16 ENCOUNTER — Emergency Department (HOSPITAL_COMMUNITY)
Admission: EM | Admit: 2015-11-16 | Discharge: 2015-11-16 | Disposition: A | Discharge status: Home / Self Care | Payer: BLUE CROSS/BLUE SHIELD | Attending: Emergency Medicine | Admitting: Emergency Medicine

## 2015-11-16 DIAGNOSIS — Z880 Allergy status to penicillin: Secondary | ICD-10-CM | POA: Insufficient documentation

## 2015-11-16 DIAGNOSIS — H6123 Impacted cerumen, bilateral: Secondary | ICD-10-CM

## 2015-11-16 DIAGNOSIS — R51 Headache: Secondary | ICD-10-CM | POA: Diagnosis not present

## 2015-11-16 DIAGNOSIS — I10 Essential (primary) hypertension: Secondary | ICD-10-CM | POA: Insufficient documentation

## 2015-11-16 DIAGNOSIS — Z79899 Other long term (current) drug therapy: Secondary | ICD-10-CM | POA: Diagnosis not present

## 2015-11-16 DIAGNOSIS — Z8639 Personal history of other endocrine, nutritional and metabolic disease: Secondary | ICD-10-CM | POA: Insufficient documentation

## 2015-11-16 HISTORY — DX: Unspecified hemorrhoids: K64.9

## 2015-11-16 NOTE — Discharge Instructions (Signed)
Cerumen Impaction The structures of the external ear canal secrete a waxy substance known as cerumen. Excess cerumen can build up in the ear canal, causing a condition known as cerumen impaction. Cerumen impaction can cause ear pain and disrupt the function of the ear. The rate of cerumen production differs for each individual. In certain individuals, the configuration of the ear canal may decrease his or her ability to naturally remove cerumen. CAUSES Cerumen impaction is caused by excessive cerumen production or buildup. RISK FACTORS  Frequent use of swabs to clean ears.  Having narrow ear canals.  Having eczema.  Being dehydrated. SIGNS AND SYMPTOMS  Diminished hearing.  Ear drainage.  Ear pain.  Ear itch. TREATMENT Treatment may involve:  Over-the-counter or prescription ear drops to soften the cerumen.  Removal of cerumen by a health care provider. This may be done with:  Irrigation with warm water. This is the most common method of removal.  Ear curettes and other instruments.  Surgery. This may be done in severe cases. HOME CARE INSTRUCTIONS  Take medicines only as directed by your health care provider.  Do not insert objects into the ear with the intent of cleaning the ear. PREVENTION  Do not insert objects into the ear, even with the intent of cleaning the ear. Removing cerumen as a part of normal hygiene is not necessary, and the use of swabs in the ear canal is not recommended.  Drink enough water to keep your urine clear or pale yellow.  Control your eczema if you have it. SEEK MEDICAL CARE IF:  You develop ear pain.  You develop bleeding from the ear.  The cerumen does not clear after you use ear drops as directed.   This information is not intended to replace advice given to you by your health care provider. Make sure you discuss any questions you have with your health care provider.   Follow up with PCP as needed. Avoid use of q-tips. Return to  the ED if you have worsening of your symptoms, ear pain, pain in the bone behind your ear, severe headaches.

## 2015-11-16 NOTE — ED Provider Notes (Signed)
CSN: [id number]     Arrival date & time 11/16/15  1649 History  By signing my name below, I, Gonzella Lex, attest that this documentation has been prepared under the direction and in the presence of [employer], PA-C. Electronically Signed: Gonzella Lex, Scribe. 11/16/2015. 6:20 PM.   Chief Complaint  Patient presents with  . Cerumen Impaction   The history is provided by the patient. No language interpreter was used.   HPI Comments: Nathaniel Carr. is a 50 y.o. male with a hx of frequent cerumen impaction, who presents to the Emergency Department complaining of gradually worsening, bilateral cerumen impaction in his ears, worse in the right ear which began two days ago. Pt also reports associated, intermittent, mild ear pain, muffled hearing, and a HA which has since resolved. Pt also notes use of q-tips to clean his ears. He requests that his ears be drained to resolve the build up of ear wax which he believes has caused his right ear to be occluded. Pt denies sore throat, neck pain, and current HA.   Past Medical History  Diagnosis Date  . ANEMIA-NOS 12/05/2009  . DYSLIPIDEMIA 01/09/2010  . Headache(784.0) 12/05/2009  . HYPERTENSION 12/05/2009  . Hemorrhoids    Past Surgical History  Procedure Laterality Date  . Hemorrhoid surgery     Family History  Problem Relation Age of Onset  . Hypertension Paternal Grandfather    Social History  Substance Use Topics  . Smoking status: Never Smoker   . Smokeless tobacco: None     Comment: Married, lives with spouse. Works as Pension scheme manager  . Alcohol Use: No    Review of Systems  All other systems reviewed and are negative.   Allergies  Codeine and Penicillins  Home Medications   Prior to Admission medications   Medication Sig Start Date End Date Taking? Authorizing Provider  amLODipine (NORVASC) 10 MG tablet Take 10 mg by mouth daily.   Yes Historical Provider, MD  Ascorbic Acid (VITAMIN C PO)  Take 1 tablet by mouth daily.   Yes Historical Provider, MD  aspirin-acetaminophen-caffeine (EXCEDRIN MIGRAINE) [phone] MG per tablet Take 2 tablets by mouth every 6 (six) hours as needed for headache.    Yes Historical Provider, MD  B Complex Vitamins (VITAMIN B-COMPLEX PO) Take 1 tablet by mouth daily.   Yes Historical Provider, MD  Cholecalciferol (VITAMIN D PO) Take 1 tablet by mouth daily.   Yes Historical Provider, MD  lidocaine (XYLOCAINE) 2 % solution Use as directed 20 mLs in the mouth or throat as needed for mouth pain. Patient not taking: Reported on 11/16/2015 05/14/15   Garlon Hatchet, PA-C   BP 143/107 mmHg  Pulse 100  Temp(Src) 97.8 F (36.6 C)  Resp 17  SpO2 99% Physical Exam  Constitutional: He is oriented to person, place, and time. He appears well-developed and well-nourished. No distress.  HENT:  Head: Normocephalic and atraumatic.  Right Ear: External ear normal.  Left Ear: External ear normal.  Nose: Nose normal.  Bilateral cerumen impactions  Eyes: Conjunctivae are normal. Right eye exhibits no discharge. Left eye exhibits no discharge. No scleral icterus.  Neck: Normal range of motion. Neck supple.  Cardiovascular: Normal rate.   Pulmonary/Chest: Effort normal.  Neurological: He is alert and oriented to person, place, and time. Coordination normal.  Skin: Skin is warm and dry. No rash noted. He is not diaphoretic. No erythema. No pallor.  Psychiatric: He has a normal mood  and affect. His behavior is normal.  Nursing note and vitals reviewed.   ED Course  Procedures  DIAGNOSTIC STUDIES:    Oxygen Saturation is 99% on RA, normal by my interpretation.   COORDINATION OF CARE:  6:02 PM Will treat pt in the ER to clear ear of cerumen impaction. Discussed treatment plan with pt at bedside and pt agreed to plan.   MDM   Final diagnoses:  Cerumen impaction, bilateral   Ceruminosis is noted.  Wax is removed by syringing and manual debridement. Instructions  for home care to prevent wax buildup are given.  I personally performed the services described in this documentation, which was scribed in my presence. The recorded information has been reviewed and is accurate.     Lester Kinsman Pitts, PA-C 11/17/15 1137  Linwood Dibbles, MD 11/17/15 [phone]

## 2015-11-16 NOTE — ED Notes (Signed)
Patient was alert, oriented and stable upon discharge. RN went over AVS and patient had no further questions.  

## 2015-11-16 NOTE — ED Notes (Signed)
Pt c/o muffled hearing and cerumen impaction in R ear x 2 days.  Pain score 5/10.

## 2019-02-21 ENCOUNTER — Inpatient Hospital Stay (HOSPITAL_COMMUNITY): Payer: Self-pay

## 2019-02-21 ENCOUNTER — Emergency Department (HOSPITAL_BASED_OUTPATIENT_CLINIC_OR_DEPARTMENT_OTHER): Payer: Self-pay

## 2019-02-21 ENCOUNTER — Other Ambulatory Visit: Payer: Self-pay

## 2019-02-21 ENCOUNTER — Encounter (HOSPITAL_BASED_OUTPATIENT_CLINIC_OR_DEPARTMENT_OTHER): Payer: Self-pay | Admitting: Emergency Medicine

## 2019-02-21 ENCOUNTER — Inpatient Hospital Stay (HOSPITAL_BASED_OUTPATIENT_CLINIC_OR_DEPARTMENT_OTHER)
Admission: EM | Admit: 2019-02-21 | Discharge: 2019-02-25 | DRG: 286 | Disposition: A | Discharge status: Home / Self Care | Payer: Self-pay | Attending: Family Medicine | Admitting: Family Medicine

## 2019-02-21 ENCOUNTER — Other Ambulatory Visit (HOSPITAL_COMMUNITY): Payer: BLUE CROSS/BLUE SHIELD

## 2019-02-21 DIAGNOSIS — I428 Other cardiomyopathies: Secondary | ICD-10-CM | POA: Diagnosis present

## 2019-02-21 DIAGNOSIS — Z79899 Other long term (current) drug therapy: Secondary | ICD-10-CM

## 2019-02-21 DIAGNOSIS — N183 Chronic kidney disease, stage 3 (moderate): Secondary | ICD-10-CM | POA: Diagnosis present

## 2019-02-21 DIAGNOSIS — R778 Other specified abnormalities of plasma proteins: Secondary | ICD-10-CM | POA: Diagnosis present

## 2019-02-21 DIAGNOSIS — Z20828 Contact with and (suspected) exposure to other viral communicable diseases: Secondary | ICD-10-CM | POA: Diagnosis present

## 2019-02-21 DIAGNOSIS — I509 Heart failure, unspecified: Secondary | ICD-10-CM

## 2019-02-21 DIAGNOSIS — N179 Acute kidney failure, unspecified: Secondary | ICD-10-CM | POA: Diagnosis present

## 2019-02-21 DIAGNOSIS — R0600 Dyspnea, unspecified: Secondary | ICD-10-CM

## 2019-02-21 DIAGNOSIS — E785 Hyperlipidemia, unspecified: Secondary | ICD-10-CM | POA: Diagnosis present

## 2019-02-21 DIAGNOSIS — I130 Hypertensive heart and chronic kidney disease with heart failure and stage 1 through stage 4 chronic kidney disease, or unspecified chronic kidney disease: Principal | ICD-10-CM | POA: Diagnosis present

## 2019-02-21 DIAGNOSIS — I248 Other forms of acute ischemic heart disease: Secondary | ICD-10-CM | POA: Diagnosis present

## 2019-02-21 DIAGNOSIS — Z885 Allergy status to narcotic agent status: Secondary | ICD-10-CM

## 2019-02-21 DIAGNOSIS — I5021 Acute systolic (congestive) heart failure: Secondary | ICD-10-CM

## 2019-02-21 DIAGNOSIS — Z880 Allergy status to penicillin: Secondary | ICD-10-CM

## 2019-02-21 DIAGNOSIS — I10 Essential (primary) hypertension: Secondary | ICD-10-CM | POA: Diagnosis present

## 2019-02-21 DIAGNOSIS — R7989 Other specified abnormal findings of blood chemistry: Secondary | ICD-10-CM | POA: Diagnosis present

## 2019-02-21 DIAGNOSIS — D649 Anemia, unspecified: Secondary | ICD-10-CM | POA: Diagnosis present

## 2019-02-21 DIAGNOSIS — R609 Edema, unspecified: Secondary | ICD-10-CM

## 2019-02-21 DIAGNOSIS — Z8249 Family history of ischemic heart disease and other diseases of the circulatory system: Secondary | ICD-10-CM

## 2019-02-21 LAB — COMPREHENSIVE METABOLIC PANEL
ALT: 117 U/L — ABNORMAL HIGH (ref 0–44)
AST: 60 U/L — ABNORMAL HIGH (ref 15–41)
Albumin: 3.6 g/dL (ref 3.5–5.0)
Alkaline Phosphatase: 59 U/L (ref 38–126)
Anion gap: 7 (ref 5–15)
BUN: 22 mg/dL — ABNORMAL HIGH (ref 6–20)
CO2: 22 mmol/L (ref 22–32)
Calcium: 8.6 mg/dL — ABNORMAL LOW (ref 8.9–10.3)
Chloride: 112 mmol/L — ABNORMAL HIGH (ref 98–111)
Creatinine, Ser: 1.47 mg/dL — ABNORMAL HIGH (ref 0.61–1.24)
GFR calc Af Amer: >60 mL/min (ref >60)
GFR calc non Af Amer: 55 mL/min — ABNORMAL LOW (ref >60)
Glucose, Bld: 122 mg/dL — ABNORMAL HIGH (ref 70–99)
Potassium: 4.2 mmol/L (ref 3.5–5.1)
Sodium: 141 mmol/L (ref 135–145)
Total Bilirubin: 2.4 mg/dL — ABNORMAL HIGH (ref 0.3–1.2)
Total Protein: 6.1 g/dL — ABNORMAL LOW (ref 6.5–8.1)

## 2019-02-21 LAB — CBC WITH DIFFERENTIAL/PLATELET
Abs Immature Granulocytes: 0.03 10*3/uL (ref 0.00–0.07)
Basophils Absolute: 0.1 10*3/uL (ref 0.0–0.1)
Basophils Relative: 1 %
Eosinophils Absolute: 0 10*3/uL (ref 0.0–0.5)
Eosinophils Relative: 1 %
HCT: 44.4 % (ref 39.0–52.0)
Hemoglobin: 13.9 g/dL (ref 13.0–17.0)
Immature Granulocytes: 1 %
Lymphocytes Relative: 11 %
Lymphs Abs: 0.7 10*3/uL (ref 0.7–4.0)
MCH: 32.3 pg (ref 26.0–34.0)
MCHC: 31.3 g/dL (ref 30.0–36.0)
MCV: 103 fL — ABNORMAL HIGH (ref 80.0–100.0)
Monocytes Absolute: 0.5 10*3/uL (ref 0.1–1.0)
Monocytes Relative: 8 %
Neutro Abs: 5.2 10*3/uL (ref 1.7–7.7)
Neutrophils Relative %: 78 %
Platelets: 223 10*3/uL (ref 150–400)
RBC: 4.31 MIL/uL (ref 4.22–5.81)
RDW: 14.9 % (ref 11.5–15.5)
WBC: 6.6 10*3/uL (ref 4.0–10.5)
nRBC: 0 % (ref 0.0–0.2)

## 2019-02-21 LAB — SARS CORONAVIRUS 2 AG (30 MIN TAT): SARS Coronavirus 2 Ag: NEGATIVE

## 2019-02-21 LAB — D-DIMER, QUANTITATIVE: D-Dimer, Quant: 1.07 ug/mL-FEU — ABNORMAL HIGH (ref 0.00–0.50)

## 2019-02-21 LAB — TROPONIN I
Troponin I: 0.03 ng/mL (ref <0.03)
Troponin I: <0.03 ng/mL (ref <0.03)
Troponin I: 0.03 ng/mL (ref <0.03)

## 2019-02-21 LAB — BRAIN NATRIURETIC PEPTIDE: B Natriuretic Peptide: 956.1 pg/mL — ABNORMAL HIGH (ref 0.0–100.0)

## 2019-02-21 LAB — TSH: TSH: 1.213 u[IU]/mL (ref 0.350–4.500)

## 2019-02-21 MED ORDER — ACETAMINOPHEN 325 MG PO TABS
650.0000 mg | ORAL_TABLET | ORAL | Status: DC | PRN
  Administered 2019-02-21 – 2019-02-23 (×4): 650 mg via ORAL
  Filled 2019-02-21 (×4): qty 2

## 2019-02-21 MED ORDER — ENOXAPARIN SODIUM 40 MG/0.4ML ~~LOC~~ SOLN
40.0000 mg | SUBCUTANEOUS | Status: DC
  Administered 2019-02-21 – 2019-02-23 (×3): 40 mg via SUBCUTANEOUS
  Filled 2019-02-21 (×3): qty 0.4

## 2019-02-21 MED ORDER — LORATADINE 10 MG PO TABS
10.0000 mg | ORAL_TABLET | Freq: Every day | ORAL | Status: DC
  Administered 2019-02-21 – 2019-02-25 (×5): 10 mg via ORAL
  Filled 2019-02-21 (×5): qty 1

## 2019-02-21 MED ORDER — FUROSEMIDE 10 MG/ML IJ SOLN
40.0000 mg | Freq: Once | INTRAMUSCULAR | Status: AC
  Administered 2019-02-21: 40 mg via INTRAVENOUS
  Filled 2019-02-21: qty 4

## 2019-02-21 MED ORDER — IOHEXOL 350 MG/ML SOLN
100.0000 mL | Freq: Once | INTRAVENOUS | Status: AC | PRN
  Administered 2019-02-21: 100 mL via INTRAVENOUS

## 2019-02-21 MED ORDER — ONDANSETRON HCL 4 MG/2ML IJ SOLN: 4.0000 mg | Freq: Four times a day (QID) | INTRAMUSCULAR | Status: DC | PRN

## 2019-02-21 MED ORDER — FUROSEMIDE 10 MG/ML IJ SOLN
40.0000 mg | Freq: Two times a day (BID) | INTRAMUSCULAR | Status: DC
  Administered 2019-02-22 – 2019-02-23 (×4): 40 mg via INTRAVENOUS
  Filled 2019-02-21 (×4): qty 4

## 2019-02-21 MED ORDER — SODIUM CHLORIDE 0.9 % IV SOLN: 250.0000 mL | INTRAVENOUS | Status: DC | PRN

## 2019-02-21 MED ORDER — SODIUM CHLORIDE 0.9% FLUSH
3.0000 mL | INTRAVENOUS | Status: DC | PRN
  Administered 2019-02-24: 3 mL via INTRAVENOUS
  Filled 2019-02-21: qty 3

## 2019-02-21 MED ORDER — SODIUM CHLORIDE 0.9% FLUSH
3.0000 mL | Freq: Two times a day (BID) | INTRAVENOUS | Status: DC
  Administered 2019-02-21 – 2019-02-25 (×7): 3 mL via INTRAVENOUS

## 2019-02-21 MED ORDER — HYDRALAZINE HCL 20 MG/ML IJ SOLN: 10.0000 mg | INTRAMUSCULAR | Status: DC | PRN

## 2019-02-21 MED ORDER — FUROSEMIDE 10 MG/ML IJ SOLN
40.0000 mg | Freq: Once | INTRAMUSCULAR | Status: AC
Start: 2019-02-21 — End: 2019-02-21
  Administered 2019-02-21: 40 mg via INTRAVENOUS
  Filled 2019-02-21: qty 4

## 2019-02-21 NOTE — Plan of Care (Signed)
Transfer from [facility] discussed with Dr. Lockie Mola. Mr. Bournes is a 53 year old male with pmh of HTN and HLD; who presented with lower extremity swelling and shortness of breath from urgent care.  Vital signs noted heart rate 106-1 15, respirations 17-25, blood pressures 130/105.145 at 118, and O2 saturation maintained on room air.  Labs revealed BUN 22, creatinine 1.47, BNP 956.1, troponin 0.03, and d-dimer 1.07.  Due to elevated d-dimer CT angiogram of the chest was obtained and showed no signs of PE, bilateral pleural effusions, and mild interstitial edema.  Coronavirus test is reportedly negative, but reported that sensitivities have not been great.  Patient was given 40 mg of Lasix IV.  TRH called to admit for acute congestive heart failure exacerbation.  Accepted as inpatient to a telemetry bed.

## 2019-02-21 NOTE — ED Notes (Signed)
Date and time results received: 02/21/19 1033  Test: trp Critical Value: 0.03 Name of Provider Notified: [place] Orders Received? Or Actions Taken?:no orders given

## 2019-02-21 NOTE — ED Provider Notes (Signed)
[facility] EMERGENCY DEPARTMENT Provider Note   CSN: [id number] Arrival date & time: 02/21/19  8889    History   Chief Complaint Chief Complaint  Patient presents with   Leg Swelling   Shortness of Breath    HPI Nathaniel Cocchi. is a 53 y.o. male.     The history is provided by the patient.  Shortness of Breath  Severity:  Mild Onset quality:  Gradual Duration:  2 weeks Timing:  Intermittent Progression:  Waxing and waning Chronicity:  New Context: activity (with leg swelling, sent from UC for concern for DVT)   Relieved by:  Nothing Worsened by:  Nothing Associated symptoms: no abdominal pain, no chest pain, no cough, no diaphoresis, no ear pain, no fever, no rash, no sore throat, no sputum production and no vomiting   Risk factors comment:  HTN   Past Medical History:  Diagnosis Date   ANEMIA-NOS 12/05/2009   DYSLIPIDEMIA 01/09/2010   Headache(784.0) 12/05/2009   Hemorrhoids    HYPERTENSION 12/05/2009    Patient Active Problem List   Diagnosis Date Noted   DYSLIPIDEMIA 01/09/2010   ANEMIA-NOS 12/05/2009   HYPERTENSION 12/05/2009   HEADACHE 12/05/2009    Past Surgical History:  Procedure Laterality Date   HEMORRHOID SURGERY          Home Medications    Prior to Admission medications   Medication Sig Start Date End Date Taking? Authorizing Provider  amLODipine (NORVASC) 10 MG tablet Take 10 mg by mouth daily.    [provider]  Ascorbic Acid (VITAMIN C PO) Take 1 tablet by mouth daily.    [provider]  aspirin-acetaminophen-caffeine (EXCEDRIN MIGRAINE) [phone] MG per tablet Take 2 tablets by mouth every 6 (six) hours as needed for headache.     [provider]  B Complex Vitamins (VITAMIN B-COMPLEX PO) Take 1 tablet by mouth daily.    [provider]  Cholecalciferol (VITAMIN D PO) Take 1 tablet by mouth daily.    [provider]  lidocaine (XYLOCAINE) 2 % solution Use as  directed 20 mLs in the mouth or throat as needed for mouth pain. 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  HENT: Negative for ear pain and sore throat.   Eyes: Negative for pain and visual disturbance.  Respiratory: Positive for shortness of breath. Negative for cough and sputum production.   Cardiovascular: Positive for leg swelling. Negative for chest pain and palpitations.  Gastrointestinal: Negative for abdominal pain, nausea and vomiting.  Genitourinary: Negative for dysuria and hematuria.  Musculoskeletal: Negative for arthralgias and back pain.  Skin: Negative for color change and rash.  Neurological: Negative for seizures and syncope.  All other systems reviewed and are negative.    Physical Exam Updated Vital Signs  ED Triage Vitals [02/21/19 0922]  Enc Vitals Group     BP (!) 145/118     Pulse Rate Marland Kitchen)  115     Resp 20     Temp 98.1 F (36.7 C)     Temp Source Oral     SpO2 99 %     Weight 190 lb (86.2 kg)     Height  (1.753 m)     Head Circumference      Peak Flow      Pain Score 0     Pain Loc      Pain Edu?      Excl. in GC?     Physical Exam Vitals signs and nursing note reviewed.  Constitutional:      General: He is not in acute distress.    Appearance: He is well-developed. He is not ill-appearing.  HENT:     Head: Normocephalic and atraumatic.  Eyes:     Extraocular Movements: Extraocular movements intact.      Conjunctiva/sclera: Conjunctivae normal.     Pupils: Pupils are equal, round, and reactive to light.  Neck:     Musculoskeletal: Normal range of motion and neck supple.  Cardiovascular:     Rate and Rhythm: Regular rhythm. Tachycardia present.     Pulses: Normal pulses.     Heart sounds: Normal heart sounds. No murmur.  Pulmonary:     Effort: Pulmonary effort is normal. Tachypnea present. No respiratory distress.     Breath sounds: Rales present. No decreased breath sounds, wheezing or rhonchi.  Abdominal:     Palpations: Abdomen is soft.     Tenderness: There is no abdominal tenderness.  Musculoskeletal:     Right lower leg: Edema (3+ pitting) present.     Left lower leg: Edema (3+ pitting) present.  Skin:    General: Skin is warm and dry.     Capillary Refill: Capillary refill takes less than 2 seconds.  Neurological:     General: No focal deficit present.     Mental Status: He is alert.      ED Treatments / Results  Labs (all labs ordered are listed, but only abnormal results are displayed) Labs Reviewed  CBC WITH DIFFERENTIAL/PLATELET - Abnormal; Notable for the following components:      Result Value   MCV 103.0 (*)    All other components within normal limits  COMPREHENSIVE METABOLIC PANEL - Abnormal; Notable for the following components:   Chloride 112 (*)    Glucose, Bld 122 (*)    BUN 22 (*)    Creatinine, Ser 1.47 (*)    Calcium 8.6 (*)    Total Protein 6.1 (*)    AST 60 (*)    ALT 117 (*)    Total Bilirubin 2.4 (*)    GFR calc non Af Amer 55 (*)    All other components within normal limits  BRAIN NATRIURETIC PEPTIDE - Abnormal; Notable for the following components:   B Natriuretic Peptide 956.1 (*)    All other components within normal limits  TROPONIN I - Abnormal; Notable for the following components:   Troponin I 0.03 (*)    All other components within normal limits  D-DIMER, QUANTITATIVE (NOT AT [facility]) - Abnormal; Notable for the following  components:   D-Dimer, Quant 1.07 (*)    All other components within normal limits  SARS CORONAVIRUS 2 (HOSP ORDER, PERFORMED IN [facility] VIA ABBOTT ID)    EKG EKG Interpretation  Date/Time:  Saturday Feb 21 2019 09:34:03 EDT Ventricular Rate:  115 PR Interval:    QRS Duration: 110 QT Interval:  351 QTC Calculation: 486 R Axis:   8 Text Interpretation:  Sinus tachycardia Nonspecific repol abnormality, lateral leads Confirmed by Virgina Norfolk [phone]) on 02/21/2019 9:40:13 AM   Radiology Ct Angio Chest Pe W And/or Wo Contrast  Result Date: 02/21/2019 CLINICAL DATA:  Cough, chest pain, shortness of breath, elevated D-dimer. EXAM: CT ANGIOGRAPHY CHEST WITH CONTRAST TECHNIQUE: Multidetector CT imaging of the chest was performed using the standard protocol during bolus administration of intravenous contrast. Multiplanar CT image reconstructions and MIPs were obtained to evaluate the vascular anatomy. CONTRAST:  OMNIPAQUE IOHEXOL 350 MG/ML SOLN COMPARISON:  None. FINDINGS: Cardiovascular: Satisfactory opacification of the bilateral pulmonary arteries to the segmental level. No evidence of pulmonary embolism. No evidence of thoracic aortic aneurysm. The heart is normal in size.  No pericardial effusion. Mediastinum/Nodes: No suspicious mediastinal lymphadenopathy. Visualized thyroid is unremarkable. Lungs/Pleura: Mild paraseptal emphysematous changes at the lung apices. Mild mosaic attenuation/ground-glass opacities in the lungs bilaterally, possibly reflecting mild interstitial edema, less likely air trapping. Small bilateral pleural effusions. No suspicious pulmonary nodules. No pneumothorax. Upper Abdomen: Visualized upper abdomen is grossly unremarkable. Musculoskeletal: Visualized osseous structures are within normal limits. Review of the MIP images confirms the above findings. IMPRESSION: No evidence of pulmonary embolism. Small bilateral pleural effusions. Possible mild interstitial  edema, less likely air trapping related to small airways disease. Electronically Signed   By: Charline Bills M.D.   On: 02/21/2019 11:32   US Venous Img Lower Bilateral  Result Date: 02/21/2019 CLINICAL DATA:  Shortness of breath x2 weeks, bilateral lower leg swelling x6 days EXAM: BILATERAL LOWER EXTREMITY VENOUS DOPPLER ULTRASOUND TECHNIQUE: Gray-scale sonography with graded compression, as well as color Doppler and duplex ultrasound were performed to evaluate the lower extremity deep venous systems from the level of the common femoral vein and including the common femoral, femoral, profunda femoral, popliteal and calf veins including the posterior tibial, peroneal and gastrocnemius veins when visible. The superficial great saphenous vein was also interrogated. Spectral Doppler was utilized to evaluate flow at rest and with distal augmentation maneuvers in the common femoral, femoral and popliteal veins. COMPARISON:  None. FINDINGS: RIGHT LOWER EXTREMITY Common Femoral Vein: No evidence of thrombus. Normal compressibility, respiratory phasicity and response to augmentation. Saphenofemoral Junction: No evidence of thrombus. Normal compressibility and flow on color Doppler imaging. Profunda Femoral Vein: No evidence of thrombus. Normal compressibility and flow on color Doppler imaging. Femoral Vein: No evidence of thrombus. Normal compressibility, respiratory phasicity and response to augmentation. Popliteal Vein: No evidence of thrombus. Normal compressibility, respiratory phasicity and response to augmentation. Calf Veins: No evidence of thrombus. Normal compressibility and flow on color Doppler imaging. Superficial Great Saphenous Vein: No evidence of thrombus. Normal compressibility. Venous Reflux:  None. Other Findings:  Subcutaneous edema in the calf. LEFT LOWER EXTREMITY Common Femoral Vein: No evidence of thrombus. Normal compressibility, respiratory phasicity and response to augmentation.  Saphenofemoral Junction: No evidence of thrombus. Normal compressibility and flow on color Doppler imaging. Profunda Femoral Vein: No evidence of thrombus. Normal compressibility and flow on color Doppler imaging. Femoral Vein: No evidence of thrombus. Normal compressibility, respiratory phasicity and response to augmentation. Popliteal Vein: No evidence of thrombus. Normal compressibility, respiratory phasicity and response to augmentation. Calf Veins: No evidence of thrombus. Normal compressibility and flow on color Doppler imaging. Superficial Great Saphenous Vein: No evidence of thrombus. Normal compressibility. Venous Reflux:  None. Other Findings:  Subcutaneous edema in the calf. IMPRESSION: No evidence of deep venous thrombosis in the visualized bilateral lower  extremities. Subcutaneous edema in the calves bilaterally. Electronically Signed   By: Charline Bills M.D.   On: 02/21/2019 11:24    Procedures Procedures (including critical care time)  Medications Ordered in ED Medications  furosemide (LASIX) injection 40 mg (has no administration in time range)  iohexol (OMNIPAQUE) 350 MG/ML injection 100 mL (100 mLs Intravenous Contrast Given 02/21/19 1052)     Initial Impression / Assessment and Plan / ED Course  I have reviewed the triage vital signs and the nursing notes.  Pertinent labs & imaging results that were available during my care of the patient were reviewed by me and considered in my medical decision making (see chart for details).     Jackey Housey. is a 53 year old male with history of hypertension, high cholesterol presents the ED with shortness of breath, leg swelling.  Patient mildly tachycardic upon arrival but otherwise normal vitals.  Symptoms have been ongoing for the past 2 weeks.  Has noticed increased leg swelling bilaterally.  No leg pain, calf pain.  Patient sent from urgent care due to concern for DVT.  Patient does endorse that he has noted some shortness  of breath with exertion and when lying flat. Some rales on exam, some tachypnea. Both legs appear swollen with 3+ pitting edema bilaterally.  Possibly the right leg is slightly larger than the left.  No history of PE or DVT.  Concern for possible heart failure versus liver failure versus kidney disease given leg swelling.  Possibly venous stasis process.  Possibly DVT.  Will evaluate with lab work including d-dimer, troponin, BNP.  EKG shows sinus tachycardia at 115.  Has some ST changes laterally in V4 through V6.  Patient denies any chest pain, abdominal pain.  Will reevaluate after lab work and imaging.  Patient found to have elevated troponin and d-dimer.  CT scan did not show a PE but did show bilateral pleural effusions.  BNP is elevated.  Overall suspect that symptoms are likely secondary to new onset heart failure.  DVT study also negative.  Patient with elevation of creatinine as well.  Patient was given IV Lasix.  Will admit the patient for further care given new heart failure. Covid test was poor sample but given CT chest findings and overall poor quality of test, will just get covid clearance upon admission.   This chart was dictated using voice recognition software.  Despite best efforts to proofread,  errors can occur which can change the documentation meaning.    Final Clinical Impressions(s) / ED Diagnoses   Final diagnoses:  Peripheral edema  New onset of congestive heart failure (HCC)  Dyspnea, unspecified type    ED Discharge Orders    None       Virgina Norfolk, DO 02/21/19 1212

## 2019-02-21 NOTE — ED Triage Notes (Addendum)
Bilateral leg swelling x 1 week. Sent from UC. Reports and increase in SOB with exertion. Also reports he has been out of BP meds for 1 week.

## 2019-02-21 NOTE — Progress Notes (Signed)
Patient educated on the need for I&O measurements. Patient agrees to urinate in the urinal and alert staff when it needs emptying. Patient is agreeable to treatment plan and all questions have been answered thus far.

## 2019-02-21 NOTE — H&P (Signed)
History and Physical    Nathaniel Carr. MVH:[id number] DOB: 1966/08/25 DOA: 02/21/2019  Referring MD/NP/PA:Adam Seth Bake, MD PCP: Patient, [name]  Patient coming from: Transfer from [facility]  Chief Complaint: leg swelling and shortness of breath  I have personally briefly reviewed patient's old medical records in [facility]   HPI: Nathaniel Carr. is a 53 y.o. male with medical history significant of HTN, HLD, and anemia; who presents with complaints of leg swelling and shortness of breath.  Reports for the last 2 weeks he is been short of breath with exertion, and over the last week he started developing lower extremity swelling.  Associated symptoms include a dry hacking cough, loss of hearing out of right ear, orthopnea, nausea, and 2 episodes of vomiting.  Denies having any fevers, chills, chest pain, palpitations, recent illnesses, or calf pain.  He went to a urgent care initially for symptoms and was sent to [facility] for further evaluation.  ED Course: Upon admission patient was noted to be afebrile, heart rate 106-115, respirations 17-25, blood pressures 130/105.145 at 118, and O2 saturation maintained on room air.  Labs revealed BUN 22, creatinine 1.47, BNP 956.1, troponin 0.03, and d-dimer 1.07.  Vascular Doppler ultrasound of the lower extremities negative for any signs of a blood clot.  CT angiogram of the chest was obtained showing no signs of pulmonary embolus, bilateral pleural effusions, and mild interstitial edema.  Coronavirus test is reportedly negative, but reported that sensitivities have not been great.  Patient was given 40 mg of Lasix IV.  TRH accepted to a telemetry bed.  Review of Systems  Constitutional: Negative for chills and fever.  HENT: Positive for hearing loss. Negative for nosebleeds.   Eyes: Negative for photophobia and pain.  Respiratory: Positive for cough and shortness of breath. Negative for sputum production.    Cardiovascular: Positive for leg swelling. Negative for chest pain and palpitations.  Gastrointestinal: Positive for nausea and vomiting. Negative for abdominal pain and diarrhea.  Genitourinary: Negative for dysuria and hematuria.  Musculoskeletal: Negative for joint pain and myalgias.  Neurological: Negative for focal weakness and loss of consciousness.  Psychiatric/Behavioral: Negative for hallucinations and substance abuse.    Past Medical History:  Diagnosis Date  . ANEMIA-NOS 12/05/2009  . DYSLIPIDEMIA 01/09/2010  . Headache(784.0) 12/05/2009  . Hemorrhoids   . HYPERTENSION 12/05/2009    Past Surgical History:  Procedure Laterality Date  . HEMORRHOID SURGERY       reports that he has never smoked. He has never used smokeless tobacco. He reports that he does not drink alcohol or use drugs.  Allergies  Allergen Reactions  . Codeine     Unknown-childhood allergy.   . Penicillins     Unknown-childhood allergies.      Family History  Problem Relation Age of Onset  . Hypertension Paternal Grandfather     Prior to Admission medications   Medication Sig Start Date End Date Taking? Authorizing Provider  amLODipine (NORVASC) 10 MG tablet Take 10 mg by mouth daily.    [provider]  Ascorbic Acid (VITAMIN C PO) Take 1 tablet by mouth daily.    [provider]  aspirin-acetaminophen-caffeine (EXCEDRIN MIGRAINE) [phone] MG per tablet Take 2 tablets by mouth every 6 (six) hours as needed for headache.     [provider]  B Complex Vitamins (VITAMIN B-COMPLEX PO) Take 1 tablet by mouth daily.    [provider]  Cholecalciferol (VITAMIN D PO)  Take 1 tablet by mouth daily.    [provider]  lidocaine (XYLOCAINE) 2 % solution Use as directed 20 mLs in the mouth or throat as needed for mouth pain. Patient not taking: Reported on 11/16/2015 05/14/15   Garlon Hatchet, PA-C    Physical Exam:  Constitutional: NAD, calm, comfortable  Vitals:   02/21/19 1200 02/21/19 1230 02/21/19 1330 02/21/19 1532  BP: (!) 134/116 (!) 130/105 (!) 128/103 (!) 138/99  Pulse: (!) 106  (!) 103 (!) 104  Resp: 18 (!) 23 (!) 23 18  Temp:    97.9 F (36.6 C)  TempSrc:    Oral  SpO2: 99%  100% 99%  Weight:      Height:       Eyes: PERRL, lids and conjunctivae normal ENMT: Mucous membranes are moist.  Fluid present behind right tympanic membrane. Neck: normal, supple, no masses, no thyromegaly Respiratory: Positive crackles noted bilateral mid to lower lung fields.  No wheezing.  Patient able to maintain O2 saturation on room air and talking in complete sentences at this time. Cardiovascular: Tachycardic, no murmurs / rubs / gallops.  2+ lower extremity edema. 2+ pedal pulses. No carotid bruits.  Abdomen: no tenderness, no masses palpated. No hepatosplenomegaly. Bowel sounds positive.  Musculoskeletal: no clubbing / cyanosis. No joint deformity upper and lower extremities. Good ROM, no contractures. Normal muscle tone.  Skin: no rashes, lesions, ulcers. No induration Neurologic: CN 2-12 grossly intact. Sensation intact, DTR normal. Strength 5/5 in all 4.  Psychiatric: Normal judgment and insight. Alert and oriented x 3. Normal mood.     Labs on Admission: I have personally reviewed following labs and imaging studies  CBC: Recent Labs  Lab 02/21/19 0938  WBC 6.6  NEUTROABS 5.2  HGB 13.9  HCT 44.4  MCV 103.0*  PLT 223   Basic Metabolic Panel: Recent Labs  Lab 02/21/19 0938  NA 141  K 4.2  CL 112*  CO2 22  GLUCOSE 122*  BUN 22*  CREATININE 1.47*  CALCIUM 8.6*   GFR: Estimated Creatinine Clearance: 65.4 mL/min (A) (by C-G formula based on SCr of 1.47 mg/dL (H)). Liver Function Tests: Recent Labs  Lab 02/21/19 0938  AST 60*  ALT 117*  ALKPHOS 59  BILITOT 2.4*  PROT 6.1*  ALBUMIN 3.6   No results for input(s): LIPASE, AMYLASE in the last 168 hours. No results for input(s): AMMONIA in the last 168 hours.  Coagulation Profile: No results for input(s): INR, PROTIME in the last 168 hours. Cardiac Enzymes: Recent Labs  Lab 02/21/19 0938  TROPONINI 0.03*   BNP (last 3 results) No results for input(s): PROBNP in the last 8760 hours. HbA1C: No results for input(s): HGBA1C in the last 72 hours. CBG: No results for input(s): GLUCAP in the last 168 hours. Lipid Profile: No results for input(s): CHOL, HDL, LDLCALC, TRIG, CHOLHDL, LDLDIRECT in the last 72 hours. Thyroid Function Tests: No results for input(s): TSH, T4TOTAL, FREET4, T3FREE, THYROIDAB in the last 72 hours. Anemia Panel: No results for input(s): VITAMINB12, FOLATE, FERRITIN, TIBC, IRON, RETICCTPCT in the last 72 hours. Urine analysis: No results found for: COLORURINE, APPEARANCEUR, LABSPEC, PHURINE, GLUCOSEU, HGBUR, BILIRUBINUR, KETONESUR, PROTEINUR, UROBILINOGEN, NITRITE, LEUKOCYTESUR Sepsis Labs: Recent Results (from the past 240 hour(s))  SARS Coronavirus 2 (Hosp order,Performed in [facility] lab via [employer] ID)     Status: Abnormal   Collection Time: 02/21/19 11:15 AM  Result Value Ref Range Status   SARS Coronavirus 2 ([employer] ID Now) (A)  NEGATIVE Final    INVALID, UNABLE TO DETERMINE THE PRESENCE OF TARGET DNA DUE TO SPECIMEN INTEGRITY. RECOLLECTION REQUESTED.    Comment: (NOTE) Interpretive Result Comment(s): COVID 19 Positive SARS CoV 2 target nucleic acids are DETECTED. The SARS CoV 2 RNA is generally detectable in upper and lower respiratory specimens during the acute phase of infection.  Positive results are indicative of active infection with SARS CoV 2.  Clinical correlation with patient history and other diagnostic information is necessary to determine patient infection status.  Positive results do not rule out bacterial infection or coinfection with other viruses. The expected result is Negative. COVID 19 Negative SARS CoV 2 target nucleic acids are NOT DETECTED. The SARS CoV 2 RNA is generally detectable in  upper and lower respiratory specimens during the acute phase of infection.  Negative results do not preclude SARS CoV 2 infection, do not rule out coinfections with other pathogens, and should not be used as the sole basis for treatment or other patient management decisions.  Negative results must be combined with clinical  observations, patient history, and epidemiological information. The expected result is Negative. Invalid Presence or absence of SARS CoV 2 nucleic acids cannot be determined. Repeat testing was performed on the submitted specimen and repeated Invalid results were obtained.  If clinically indicated, additional testing on a new specimen with an alternate test methodology [id number]) is advised.  The SARS CoV 2 RNA is generally detectable in upper and lower respiratory specimens during the acute phase of infection. The expected result is Negative. Fact Sheet for Patients:  http://www.graves-ford.org/ Fact Sheet for Healthcare Providers: EnviroConcern.si This test is not yet approved or cleared by the [place] FDA and has been authorized for detection and/or diagnosis of SARS CoV 2 by FDA under an Emergency Use Authorization (EUA).  This EUA will remain in effect (meaning this test can be used) for the duration of the COVID19 d eclaration under Section 564(b)(1) of the Act, 21 U.S.C. section [phone] 3(b)(1), unless the authorization is terminated or revoked sooner. Performed at [facility], [address] [place], [place] [address]   SARS Coronavirus 2 (Hosp order,Performed in [facility] lab via [employer] ID)     Status: None   Collection Time: 02/21/19 12:17 PM  Result Value Ref Range Status   SARS Coronavirus 2 (Abbott ID Now) NEGATIVE NEGATIVE Final    Comment: (NOTE) Interpretive Result Comment(s): COVID 19 Positive SARS CoV 2 target nucleic acids are DETECTED. The SARS CoV 2 RNA is generally detectable  in upper and lower respiratory specimens during the acute phase of infection.  Positive results are indicative of active infection with SARS CoV 2.  Clinical correlation with patient history and other diagnostic information is necessary to determine patient infection status.  Positive results do not rule out bacterial infection or coinfection with other viruses. The expected result is Negative. COVID 19 Negative SARS CoV 2 target nucleic acids are NOT DETECTED. The SARS CoV 2 RNA is generally detectable in upper and lower respiratory specimens during the acute phase of infection.  Negative results do not preclude SARS CoV 2 infection, do not rule out coinfections with other pathogens, and should not be used as the sole basis for treatment or other patient management decisions.  Negative results must be combined with clinical  observations, patient history, and epidemiological information. The expected result is Negative. Invalid Presence or absence of SARS CoV 2 nucleic acids cannot be determined. Repeat testing was performed on  the submitted specimen and repeated Invalid results were obtained.  If clinically indicated, additional testing on a new specimen with an alternate test methodology [phone]) is advised.  The SARS CoV 2 RNA is generally detectable in upper and lower respiratory specimens during the acute phase of infection. The expected result is Negative. Fact Sheet for Patients:  http://www.graves-ford.org/ Fact Sheet for Healthcare Providers: EnviroConcern.si This test is not yet approved or cleared by the [place] FDA and has been authorized for detection and/or diagnosis of SARS CoV 2 by FDA under an Emergency Use Authorization (EUA).  This EUA will remain in effect (meaning this test can be used) for the duration of the COVID19 d eclaration under Section 564(b)(1) of the Act, 21 U.S.C. section [phone] 3(b)(1), unless the  authorization is terminated or revoked sooner. Performed at [facility], [address] [place], [place] [address]      Radiological Exams on Admission: Ct Angio Chest Pe W And/or Wo Contrast  Result Date: 02/21/2019 CLINICAL DATA:  Cough, chest pain, shortness of breath, elevated D-dimer. EXAM: CT ANGIOGRAPHY CHEST WITH CONTRAST TECHNIQUE: Multidetector CT imaging of the chest was performed using the standard protocol during bolus administration of intravenous contrast. Multiplanar CT image reconstructions and MIPs were obtained to evaluate the vascular anatomy. CONTRAST:  OMNIPAQUE IOHEXOL 350 MG/ML SOLN COMPARISON:  None. FINDINGS: Cardiovascular: Satisfactory opacification of the bilateral pulmonary arteries to the segmental level. No evidence of pulmonary embolism. No evidence of thoracic aortic aneurysm. The heart is normal in size.  No pericardial effusion. Mediastinum/Nodes: No suspicious mediastinal lymphadenopathy. Visualized thyroid is unremarkable. Lungs/Pleura: Mild paraseptal emphysematous changes at the lung apices. Mild mosaic attenuation/ground-glass opacities in the lungs bilaterally, possibly reflecting mild interstitial edema, less likely air trapping. Small bilateral pleural effusions. No suspicious pulmonary nodules. No pneumothorax. Upper Abdomen: Visualized upper abdomen is grossly unremarkable. Musculoskeletal: Visualized osseous structures are within normal limits. Review of the MIP images confirms the above findings. IMPRESSION: No evidence of pulmonary embolism. Small bilateral pleural effusions. Possible mild interstitial edema, less likely air trapping related to small airways disease. Electronically Signed   By: Charline Bills M.D.   On: 02/21/2019 11:32   US Venous Img Lower Bilateral  Result Date: 02/21/2019 CLINICAL DATA:  Shortness of breath x2 weeks, bilateral lower leg swelling x6 days EXAM: BILATERAL LOWER EXTREMITY VENOUS DOPPLER ULTRASOUND  TECHNIQUE: Gray-scale sonography with graded compression, as well as color Doppler and duplex ultrasound were performed to evaluate the lower extremity deep venous systems from the level of the common femoral vein and including the common femoral, femoral, profunda femoral, popliteal and calf veins including the posterior tibial, peroneal and gastrocnemius veins when visible. The superficial great saphenous vein was also interrogated. Spectral Doppler was utilized to evaluate flow at rest and with distal augmentation maneuvers in the common femoral, femoral and popliteal veins. COMPARISON:  None. FINDINGS: RIGHT LOWER EXTREMITY Common Femoral Vein: No evidence of thrombus. Normal compressibility, respiratory phasicity and response to augmentation. Saphenofemoral Junction: No evidence of thrombus. Normal compressibility and flow on color Doppler imaging. Profunda Femoral Vein: No evidence of thrombus. Normal compressibility and flow on color Doppler imaging. Femoral Vein: No evidence of thrombus. Normal compressibility, respiratory phasicity and response to augmentation. Popliteal Vein: No evidence of thrombus. Normal compressibility, respiratory phasicity and response to augmentation. Calf Veins: No evidence of thrombus. Normal compressibility and flow on color Doppler imaging. Superficial Great Saphenous Vein: No evidence of thrombus. Normal compressibility. Venous Reflux:  None. Other  Findings:  Subcutaneous edema in the calf. LEFT LOWER EXTREMITY Common Femoral Vein: No evidence of thrombus. Normal compressibility, respiratory phasicity and response to augmentation. Saphenofemoral Junction: No evidence of thrombus. Normal compressibility and flow on color Doppler imaging. Profunda Femoral Vein: No evidence of thrombus. Normal compressibility and flow on color Doppler imaging. Femoral Vein: No evidence of thrombus. Normal compressibility, respiratory phasicity and response to augmentation. Popliteal Vein: No  evidence of thrombus. Normal compressibility, respiratory phasicity and response to augmentation. Calf Veins: No evidence of thrombus. Normal compressibility and flow on color Doppler imaging. Superficial Great Saphenous Vein: No evidence of thrombus. Normal compressibility. Venous Reflux:  None. Other Findings:  Subcutaneous edema in the calf. IMPRESSION: No evidence of deep venous thrombosis in the visualized bilateral lower extremities. Subcutaneous edema in the calves bilaterally. Electronically Signed   By: Charline BillsSriyesh  Krishnan M.D.   On: 02/21/2019 11:24    EKG: Independently reviewed.  Sinus tachycardia at 115 bpm.  Assessment/Plan Acute exacerbation of CHF: Patient presents with lower extremity swelling and complaints of shortness of breath.  Patient with 2+ pitting edema on physical exam.  BNP elevated at 956.1.  CT angiogram of the chest revealed pulmonary edema.  Suspect acute congestive heart failure. -Admit to a telemetry bed -Heart failure orders set  initiated  -Continuous pulse oximetry with nasal cannula oxygen as needed to keep O2 saturations >92% -Strict I&Os and daily weights -Check TSH   -Elevate lower extremities -Lasix 40 mg IV Bid -Reassess in a.m. and adjust diuresis as needed. -Check echocardiogram -No ACE-I or beta-blocker due to renal insufficiency and acute decompensation  -May warrant consultation to cardiology in a.m.   Elevated troponin: Acute.  Troponin mildly elevated 0.03.  T wave abnormalities noted, but patient denying any complaints of chest pain at this time.  Suspect secondary to demand in setting of kidney injury. -Trend cardiac enzymes.  Suspect acute kidney injury: Patient's creatinine elevated at 1.47 with BUN 22.  Baseline creatinine noted to be around 1.2 in 2015.  Previously noted to be within normal limits given heart failure suspect hypoperfusion as a cause to elevated creatinine. -Continue to monitor BMP with diuresis  Decreased hearing out of  right ear: Patient noted to have some fluid behind the right tympanic membrane. -Trial of Claritin  Essential hypertension: On admission diastolic blood pressures elevated up to 118. -Held amlodipine -Hydralazine IV as needed elevated blood pressure -Consider starting beta-blocker at some point during hospitalization  Dyslipidemia: Patient with a history of elevated cholesterol levels. -Check lipid panel in a.m.  Elevated d-dimer: Acute.  D-dimer elevated at 1.07.  Venous Doppler ultrasound of lower extremities negative for DVT.  CT angiogram of the chest obtained did not note any signs of the PE.  DVT prophylaxis: Lovenox Code Status: Full Family Communication: No family present at bedside Disposition Plan: To be determined Consults called: None Admission status: Inpatient   Clydie Braunondell A Sherrol Vicars MD [facility] Pager [phone]   If 7PM-7AM, please contact night-coverage [email or web address] Password [email or web address]  02/21/2019, 4:06 PM

## 2019-02-21 NOTE — ED Notes (Signed)
Report to Inetta Fermo, Charity fundraiser at [facility]

## 2019-02-21 NOTE — ED Notes (Signed)
Patient transported to X-ray 

## 2019-02-22 ENCOUNTER — Inpatient Hospital Stay (HOSPITAL_COMMUNITY): Payer: Self-pay

## 2019-02-22 DIAGNOSIS — R0609 Other forms of dyspnea: Secondary | ICD-10-CM

## 2019-02-22 DIAGNOSIS — I340 Nonrheumatic mitral (valve) insufficiency: Secondary | ICD-10-CM

## 2019-02-22 DIAGNOSIS — R600 Localized edema: Secondary | ICD-10-CM

## 2019-02-22 DIAGNOSIS — I361 Nonrheumatic tricuspid (valve) insufficiency: Secondary | ICD-10-CM

## 2019-02-22 LAB — BASIC METABOLIC PANEL
Anion gap: 15 (ref 5–15)
BUN: 19 mg/dL (ref 6–20)
CO2: 22 mmol/L (ref 22–32)
Calcium: 9.3 mg/dL (ref 8.9–10.3)
Chloride: 106 mmol/L (ref 98–111)
Creatinine, Ser: 1.54 mg/dL — ABNORMAL HIGH (ref 0.61–1.24)
GFR calc Af Amer: >60 mL/min (ref >60)
GFR calc non Af Amer: 52 mL/min — ABNORMAL LOW (ref >60)
Glucose, Bld: 104 mg/dL — ABNORMAL HIGH (ref 70–99)
Potassium: 3.6 mmol/L (ref 3.5–5.1)
Sodium: 143 mmol/L (ref 135–145)

## 2019-02-22 LAB — LIPID PANEL
Cholesterol: 245 mg/dL — ABNORMAL HIGH (ref 0–200)
HDL: 55 mg/dL (ref >40)
LDL Cholesterol: 171 mg/dL — ABNORMAL HIGH (ref 0–99)
Total CHOL/HDL Ratio: 4.5 RATIO
Triglycerides: 94 mg/dL (ref <150)
VLDL: 19 mg/dL (ref 0–40)

## 2019-02-22 LAB — MAGNESIUM: Magnesium: 2.2 mg/dL (ref 1.7–2.4)

## 2019-02-22 LAB — ECHOCARDIOGRAM COMPLETE
Height: 69 in
Weight: 3116.8 oz

## 2019-02-22 LAB — HIV ANTIBODY (ROUTINE TESTING W REFLEX): HIV Screen 4th Generation wRfx: NONREACTIVE

## 2019-02-22 LAB — TROPONIN I: Troponin I: 0.03 ng/mL (ref <0.03)

## 2019-02-22 MED ORDER — POTASSIUM CHLORIDE CRYS ER 20 MEQ PO TBCR
40.0000 meq | EXTENDED_RELEASE_TABLET | Freq: Once | ORAL | Status: AC
  Administered 2019-02-22: 40 meq via ORAL
  Filled 2019-02-22: qty 2

## 2019-02-22 NOTE — Progress Notes (Signed)
  Echocardiogram 2D Echocardiogram has been performed.  Nathaniel Carr 02/22/2019, 1:13 PM

## 2019-02-22 NOTE — Progress Notes (Signed)
Chaplain responded to spiritual care consult.  Patient requests prayer.  Chaplain found patient sitting in chair, eating when she arrived. He was smiling.  "I am good" he said, "I was afraid to miss Sunday Service but I saw it on TV this morning." Chaplain offered a blessing and said she would be available. Rev. Lynnell Chad Pager [phone]

## 2019-02-22 NOTE — Progress Notes (Signed)
PROGRESS NOTE  Nathaniel Carr. WTU:[id number] DOB: 14-Oct-1965 DOA: 02/21/2019 PCP: Patient, [name]   LOS: 1 day   Patient is from: Home  Brief Narrative / Interim history: 53 year old male with history of hypertension, hyperlipidemia and anemia presenting with leg swelling and shortness of breath for 1 to 2 weeks and admitted for possible acute CHF.  He has no prior diagnosis of CHF. Denies NSAID use, alcohol or dietary indiscretion.  Recently ran out of his amlodipine.  He does not have PCP.  Sleeps with 2 pillow which is unchanged from baseline.  No chest pain.  Not a smoker or drinker.  Denies recreational drug use.  In ED, slightly tachycardic and hypertensive.  Good saturation on room air.  Creatinine 1.47 (unknown baseline but 1.2 in 2015).  956 (unknown baseline).  Troponin 0 0.033.  D-dimer elevated to 1.07.  Lower extremity venous Doppler negative for DVT.  CTA chest negative for PE but a small pleural effusion and mild interstitial edema. COVID-19 test negative.  Started on IV Lasix 40 mg and hospitalist service was consulted for admission.  Patient was admitted for acute CHF and started on IV Lasix 40 mg twice daily.  Subjective: Reports improvement in his breathing and swelling but not quite back to baseline.  Denies chest pain, palpitation, dizziness.  Cough resolved.  No GI or GU symptoms.  Assessment & Plan: New acute CHF: Unknown type.  As dyspnea and edema.  Exam with JVD, 2+ edema and bibasilar crackles. Started on IV Lasix 40 mg twice daily with excellent urine output.  Renal function stable.  TSH within normal range. -Continue IV Lasix 40 mg twice daily -Follow echocardiogram -Daily weight, intake output and renal function -Replenish electrolytes aggressively -Check A1c and lipid panel -Consult case management for PCP  Essential hypertension: DBP elevated to 104 this morning. -Continue Lasix as above for now -We will initiate antihypertensive medication  based on echo finding and renal function. -Amlodipine might not be a good choice given his edema.  Elevated d-dimer: DVT and PE excluded by venous Doppler and CTA chest.  Elevated creatinine: 1.47 on admission (unknown baseline but 1.2 in 2015) -Continue monitoring  Scheduled Meds:  enoxaparin (LOVENOX) injection  40 mg Subcutaneous Q24H   furosemide  40 mg Intravenous BID   loratadine  10 mg Oral Daily   potassium chloride  40 mEq Oral Once   sodium chloride flush  3 mL Intravenous Q12H   Continuous Infusions:  sodium chloride     PRN Meds:.sodium chloride, acetaminophen, hydrALAZINE, ondansetron (ZOFRAN) IV, sodium chloride flush   DVT prophylaxis: Subcu Lovenox Code Status: Full code Family Communication: Updated patient's wife over the phone Disposition Plan: Remains inpatient for adequate diuresis  Consultants:   None  Procedures:   None  Microbiology:  COVID-19 screen negative  Antimicrobials: Anti-infectives (From admission, onward)   None       Objective: Vitals:   02/21/19 1933 02/21/19 2344 02/22/19 0501 02/22/19 0533  BP: (!) 118/93 (!) 120/94 (!) 144/118 (!) 123/103  Pulse: (!) 103 98 (!) 105 97  Resp: (!) 24 18 16    Temp: (!) 97.5 F (36.4 C) (!) 97.5 F (36.4 C) 97.6 F (36.4 C)   TempSrc: Oral Oral Oral   SpO2: 100% 100% 100%   Weight:    88.4 kg  Height:        Intake/Output Summary (Last 24 hours) at 02/22/2019 0745 Last data filed at 02/22/2019 0555 Gross per 24 hour  Intake 1360 ml  Output 3895 ml  Net -2535 ml   Filed Weights   02/21/19 0922 02/21/19 1608 02/22/19 0533  Weight: 86.2 kg 90.7 kg 88.4 kg    Examination:  GENERAL: No acute distress.  Appears well.  HEENT: MMM.  Vision and hearing grossly intact.  NECK: Supple.  Positive JVD LUNGS:  No IWOB. Good air movement bilaterally.  Bibasilar crackles HEART:  RRR. Heart sounds normal.  Positive for JVD and 2+ pitting edema bilaterally ABD: Bowel sounds present.  Soft. Non tender.  MSK/EXT:  Moves all extremities. No apparent deformity.  2+ pitting edema bilaterally SKIN: no apparent skin lesion or wound NEURO: Awake, alert and oriented appropriately.  No gross deficit.  PSYCH: Calm. Normal affect.    Data Reviewed: I have independently reviewed following labs and imaging studies  CBC: Recent Labs  Lab 02/21/19 0938  WBC 6.6  NEUTROABS 5.2  HGB 13.9  HCT 44.4  MCV 103.0*  PLT 223   Basic Metabolic Panel: Recent Labs  Lab 02/21/19 0938 02/22/19 0456  NA 141 143  K 4.2 3.6  CL 112* 106  CO2 22 22  GLUCOSE 122* 104*  BUN 22* 19  CREATININE 1.47* 1.54*  CALCIUM 8.6* 9.3  MG  --  2.2   GFR: Estimated Creatinine Clearance: 63.1 mL/min (A) (by C-G formula based on SCr of 1.54 mg/dL (H)). Liver Function Tests: Recent Labs  Lab 02/21/19 0938  AST 60*  ALT 117*  ALKPHOS 59  BILITOT 2.4*  PROT 6.1*  ALBUMIN 3.6   No results for input(s): LIPASE, AMYLASE in the last 168 hours. No results for input(s): AMMONIA in the last 168 hours. Coagulation Profile: No results for input(s): INR, PROTIME in the last 168 hours. Cardiac Enzymes: Recent Labs  Lab 02/21/19 0938 02/21/19 1617 02/21/19 2226 02/22/19 0456  TROPONINI 0.03* <0.03 0.03* 0.03*   BNP (last 3 results) No results for input(s): PROBNP in the last 8760 hours. HbA1C: No results for input(s): HGBA1C in the last 72 hours. CBG: No results for input(s): GLUCAP in the last 168 hours. Lipid Profile: Recent Labs    02/22/19 0456  CHOL 245*  HDL 55  LDLCALC 171*  TRIG 94  CHOLHDL 4.5   Thyroid Function Tests: Recent Labs    02/21/19 1617  TSH 1.213   Anemia Panel: No results for input(s): VITAMINB12, FOLATE, FERRITIN, TIBC, IRON, RETICCTPCT in the last 72 hours. Urine analysis: No results found for: COLORURINE, APPEARANCEUR, LABSPEC, PHURINE, GLUCOSEU, HGBUR, BILIRUBINUR, KETONESUR, PROTEINUR, UROBILINOGEN, NITRITE, LEUKOCYTESUR Sepsis Labs: Invalid  input(s): PROCALCITONIN, LACTICIDVEN  Recent Results (from the past 240 hour(s))  SARS Coronavirus 2 (Hosp order,Performed in [facility] lab via Abbott ID)     Status: Abnormal   Collection Time: 02/21/19 11:15 AM  Result Value Ref Range Status   SARS Coronavirus 2 (Abbott ID Now) (A) NEGATIVE Final    INVALID, UNABLE TO DETERMINE THE PRESENCE OF TARGET DNA DUE TO SPECIMEN INTEGRITY. RECOLLECTION REQUESTED.    Comment: (NOTE) Interpretive Result Comment(s): COVID 19 Positive SARS CoV 2 target nucleic acids are DETECTED. The SARS CoV 2 RNA is generally detectable in upper and lower respiratory specimens during the acute phase of infection.  Positive results are indicative of active infection with SARS CoV 2.  Clinical correlation with patient history and other diagnostic information is necessary to determine patient infection status.  Positive results do not rule out bacterial infection or coinfection with other viruses. The expected result is Negative. COVID 19  Negative SARS CoV 2 target nucleic acids are NOT DETECTED. The SARS CoV 2 RNA is generally detectable in upper and lower respiratory specimens during the acute phase of infection.  Negative results do not preclude SARS CoV 2 infection, do not rule out coinfections with other pathogens, and should not be used as the sole basis for treatment or other patient management decisions.  Negative results must be combined with clinical  observations, patient history, and epidemiological information. The expected result is Negative. Invalid Presence or absence of SARS CoV 2 nucleic acids cannot be determined. Repeat testing was performed on the submitted specimen and repeated Invalid results were obtained.  If clinically indicated, additional testing on a new specimen with an alternate test methodology [phone]) is advised.  The SARS CoV 2 RNA is generally detectable in upper and lower respiratory specimens during the acute phase  of infection. The expected result is Negative. Fact Sheet for Patients:  http://www.graves-ford.org/ Fact Sheet for Healthcare Providers: EnviroConcern.si This test is not yet approved or cleared by the [place] FDA and has been authorized for detection and/or diagnosis of SARS CoV 2 by FDA under an Emergency Use Authorization (EUA).  This EUA will remain in effect (meaning this test can be used) for the duration of the COVID19 d eclaration under Section 564(b)(1) of the Act, 21 U.S.C. section [phone] 3(b)(1), unless the authorization is terminated or revoked sooner. Performed at [facility], [address] [place], [place] [address]   SARS Coronavirus 2 (Hosp order,Performed in [facility] lab via Abbott ID)     Status: None   Collection Time: 02/21/19 12:17 PM  Result Value Ref Range Status   SARS Coronavirus 2 (Abbott ID Now) NEGATIVE NEGATIVE Final    Comment: (NOTE) Interpretive Result Comment(s): COVID 19 Positive SARS CoV 2 target nucleic acids are DETECTED. The SARS CoV 2 RNA is generally detectable in upper and lower respiratory specimens during the acute phase of infection.  Positive results are indicative of active infection with SARS CoV 2.  Clinical correlation with patient history and other diagnostic information is necessary to determine patient infection status.  Positive results do not rule out bacterial infection or coinfection with other viruses. The expected result is Negative. COVID 19 Negative SARS CoV 2 target nucleic acids are NOT DETECTED. The SARS CoV 2 RNA is generally detectable in upper and lower respiratory specimens during the acute phase of infection.  Negative results do not preclude SARS CoV 2 infection, do not rule out coinfections with other pathogens, and should not be used as the sole basis for treatment or other patient management decisions.  Negative results must be combined with  clinical  observations, patient history, and epidemiological information. The expected result is Negative. Invalid Presence or absence of SARS CoV 2 nucleic acids cannot be determined. Repeat testing was performed on the submitted specimen and repeated Invalid results were obtained.  If clinically indicated, additional testing on a new specimen with an alternate test methodology [phone]) is advised.  The SARS CoV 2 RNA is generally detectable in upper and lower respiratory specimens during the acute phase of infection. The expected result is Negative. Fact Sheet for Patients:  http://www.graves-ford.org/ Fact Sheet for Healthcare Providers: EnviroConcern.si This test is not yet approved or cleared by the [place] FDA and has been authorized for detection and/or diagnosis of SARS CoV 2 by FDA under an Emergency Use Authorization (EUA).  This EUA will remain in effect (meaning this test can  be used) for the duration of the COVID19 d eclaration under Section 564(b)(1) of the Act, 21 U.S.C. section 360bbb 3(b)(1), unless the authorization is terminated or revoked sooner. Performed at [facility] [place], [address] [place], [place] [address]       Radiology Studies: Ct Angio Chest Pe W And/or Wo Contrast  Result Date: 02/21/2019 CLINICAL DATA:  Cough, chest pain, shortness of breath, elevated D-dimer. EXAM: CT ANGIOGRAPHY CHEST WITH CONTRAST TECHNIQUE: Multidetector CT imaging of the chest was performed using the standard protocol during bolus administration of intravenous contrast. Multiplanar CT image reconstructions and MIPs were obtained to evaluate the vascular anatomy. CONTRAST:  <MEASUREaton EstatKoreaesEMMWestside Outpatient CKoreaenContinuecare Hospital At H<MEASUREMEBethann BerkshLaverle PaSwazil29Mary Lanning MParkcDoris Miller Department Of Veterans Affairs Medical CenUlyses SoSte6.96(EDespina [phone]-<MEASUREMBethKoreaelENMHenry Ford Macomb Hospital-Mt ClemeKoreansChristus St Vincent R<MEASUREMEBethann BerkshLaverle PaSwazil29Midwest Eye SuRegional Health SSundance Hospital DalUlyses SoSte6.96(EDespina [phone]-<MEASUREMENTLandKoreais>oMNacogdoches MedicKoreaalBakersfield Specialis<MEASUREMEBethann BerkshLaverle PaSwazil29[place] HAmbulatory Surgical [facility] D[facility] AmBoulder Community HospiUlyses SoSte6.96(EDespina [phone]-<MEASUREdKoreanaEMMDivine SaviorKorea HAlbany Medical Center -<MEASUREMEBethann BerkshLaverle PaSwazil29Daniels MGSurgcenter GilbUlyses SoSte6.96(EDespina [phone]-<MEASURECentervilKorealeMEMPecos County MemorialKorea HW<MEASUREMEBethann BerkshLaverle PaSwazil29LaredRegional Phoebe Worth Medical CenUlyses SoSte6.96(EDespina [phone] RivervaKorealeoPMSaint Josephs Hospital OKoreaf Adc<MEASUREMEBethann BerkshLaverle PaSwazil29PresQuincy Medical CenUlyses SoSte6.96(EDespina [phone]-<MEASUREMEVinceKoreantNTMRegional Health Rapid CityKorea HLewi<MEASUREMEBethann BerkshLaverle PaSwazil29Monroe CouPend Oreille Surgery Center Ulyses SoSte6.96(EDespina [phone]<MEASURRoxtKoreaonEMMWar MemorialKorea HLifecare H<MEASUREMEBethann BerkshLaverle PaSwazil29[facility] HoResuValley View Medical CenUlyses SoSte6.96(EDespina [phone] oCTappKoreaanryMEast Cooper MedicKoreaalPatrick B Harri<MEASUREMEBethann BerkshLaverle PaSwazil29Round RocMid MisWillough At Naples HospiUlyses SoSte6.96(EDespina Ar480 105 <MEASUREMMidvaKorealeENMArise Austin MedicKoreaalShannon West T<MEASUREMEBethann BerkshLaverle PaSwazil29Auestetic Plastic Surgery Center LP Dba Museum District AmbulatorHot SprinNaples Eye Surgery CenUlyses SoSte6.96(EDespina Ar276-054-<MEASURMechanicsbKoreargEMMAssociated Eye Surgical CKoreaenPacific Gastroenter<MEASUREMEBethann BerkshLaverle PaSwazil29[facility] MPAdvent Health CarrollwUlyses SoSte6.96(EDespina Ar618 333 <MEASUREMENTWest BranKoreach>oMSidney HealKoreat<MEASUREMEBethann BerkshLaverle PaSwazil29Sheridan CoBloomingtHshs Good Shepard Hospital Ulyses SoSte6.96(EDespina Ar518138<MEASUREMENTWanaminKoreago>oMClarksburg Va MedicKoreaalCornerstone Ho<MEASUREMEBethann BerkshLaverle PaSwazil29[place] MSharkey-IssaOrlando Surgicare Ulyses SoSte6.96(EDespina Ar754-415-<MEASUREMENTPalm CoaKoreat>oMSoutheast Alaska SurgeKorearyVa Medical Cen<MEASUREMEBethann BerkshLaverle PaSwaVa New JSurgery Affiliates Ulyses SoSte6.96(EDespina [phone]-<MEASUREMEDeer PaKorearkNTMStonegate Surgery Kore<MEASUREMEBethann BerkshLaverle PaSwazil29[place] GePremier Specialty Surgical Center Ulyses SoSte6.96(EDespina Ar980-657-<MEASUREMHarmoKoreanyENMSentara Norfolk GeneralKor<MEASUREMEBethann BerkshLaverle PaSwazil29Troy RegionaCentura Health-AAnchorage Endoscopy Center Ulyses SoSte6.96(EDespina [phone]<MEASUREMViKoreanaENMEndoscopy Center Of KnoKoreaxvRegional Gene<MEASUREMEBethann BerkshLaverle PaSwazil29Musc Health LancasteTexas Gastro Care Ulyses SoSte6.96(EDespina Ar281 557 <MEASUREMENWilmingtKoreaonT>MFirst Coast Orthopedic CKoreaenMckenzie-Wil<MEASUREMEBethann BerkshLaverle PaSwazil29Molokai PheLPWellspan Good Samaritan Hospital, Ulyses SoSte6.96(EDespina Ar618-069-<MEASUREMERoyaltKoreaonNTMValdosta Endoscopy CKoreaenNor<MEASUREMEBethann BerkshLaverle PaSwazil29SierrDini-Townsend Hospital At [facility] Nevada AdulBethesda Hospital WUlyses SoSte6.96(EDespina [phone] Millardvicesc MG/ML SOLN COMPARISON:  None. FINDINGS: Cardiovascular: Satisfactory opacification of the bilateral pulmonary arteries to the segmental level. No evidence of pulmonary embolism. No evidence of thoracic aortic aneurysm. The  heart is normal in size.  No pericardial effusion. Mediastinum/Nodes: No suspicious mediastinal lymphadenopathy. Visualized thyroid is unremarkable. Lungs/Pleura: Mild paraseptal emphysematous changes at the lung apices. Mild mosaic attenuation/ground-glass opacities in the lungs bilaterally, possibly reflecting mild interstitial edema, less likely air trapping. Small bilateral pleural effusions. No suspicious pulmonary nodules. No pneumothorax. Upper Abdomen: Visualized upper abdomen is grossly unremarkable. Musculoskeletal: Visualized osseous structures are within normal limits. Review of the MIP images confirms the above findings. IMPRESSION: No evidence of pulmonary embolism. Small bilateral pleural effusions. Possible mild interstitial edema, less likely air trapping related to small airways disease. Electronically Signed   By: Sriyesh  Krishnan M.D.   On: 02/21/2019 11:32   Us Venous Img Lower Bilateral  Result Date: 02/21/2019 CLINICAL DATA:  Shortness of breath x2 weeks, bilateral lower leg swelling x6 days EXAM: BILATERAL LOWER EXTREMITY VENOUS DOPPLER ULTRASOUND TECHNIQUE: Gray-scale sonography with graded compression, as well as color Doppler and duplex ultrasound were performed to evaluate the lower extremity deep venous systems from the level of the common femoral vein and including the common femoral, femoral, profunda femoral, popliteal and calf veins including the posterior tibial, peroneal and gastrocnemius veins when visible. The superficial great saphenous vein was also interrogated. Spectral Doppler was utilized to evaluate flow at rest and with distal augmentation maneuvers in the common femoral, femoral and popliteal veins. COMPARISON:  None. FINDINGS: RIGHT LOWER EXTREMITY Common Femoral Vein: No evidence of thrombus. Normal compressibility, respiratory phasicity and response to augmentation. Saphenofemoral Junction: No evidence of thrombus. Normal compressibility and flow on color Doppler  imaging. Profunda Femoral Vein: No evidence of thrombus. Normal compressibility and flow on color Doppler imaging. Femoral Vein: No evidence of thrombus. Normal compressibility, respiratory phasicity and response to augmentation. Popliteal Vein: No evidence of thrombus. Normal compressibility, respiratory phasicity and response to augmentation. Calf Veins: No evidence of thrombus. Normal compressibility and flow on color Doppler imaging. Superficial Great Saphenous Vein: No evidence of thrombus. Normal compressibility. Venous Reflux:  None. Other Findings:  Subcutaneous edema in the calf. LEFT LOWER EXTREMITY Common Femoral Vein: No evidence of thrombus. Normal compressibility, respiratory phasicity and response to augmentation. Saphenofemoral Junction: No evidence of thrombus. Normal compressibility and flow on color Doppler imaging. Profunda Femoral Vein: No evidence of thrombus. Normal compressibility and flow on color Doppler imaging. Femoral Vein: No evidence of thrombus. Normal compressibility, respiratory phasicity and response to augmentation. Popliteal Vein: No evidence of thrombus. Normal compressibility, respiratory phasicity and response to augmentation. Calf Veins: No evidence of thrombus. Normal compressibility and flow on color Doppler imaging. Superficial Great Saphenous Vein: No evidence of thrombus. Normal compressibility. Venous Reflux:  None.  Other Findings:  Subcutaneous edema in the calf. IMPRESSION: No evidence of deep venous thrombosis in the visualized bilateral lower extremities. Subcutaneous edema in the calves bilaterally. Electronically Signed   By: Charline Bills M.D.   On: 02/21/2019 11:24    35 minutes with more than 50% spent in reviewing records, counseling patient and coordinating care.  Kameshia Madruga [facility] Pager [phone]  If 7PM-7AM, please contact night-coverage [email or web address] Password [facility] 02/22/2019, 7:45 AM

## 2019-02-23 DIAGNOSIS — I5021 Acute systolic (congestive) heart failure: Secondary | ICD-10-CM

## 2019-02-23 DIAGNOSIS — I10 Essential (primary) hypertension: Secondary | ICD-10-CM

## 2019-02-23 DIAGNOSIS — I5023 Acute on chronic systolic (congestive) heart failure: Secondary | ICD-10-CM

## 2019-02-23 LAB — BASIC METABOLIC PANEL
Anion gap: 13 (ref 5–15)
Anion gap: 11 (ref 5–15)
BUN: 20 mg/dL (ref 6–20)
BUN: 20 mg/dL (ref 6–20)
CO2: 26 mmol/L (ref 22–32)
CO2: 28 mmol/L (ref 22–32)
Calcium: 9.1 mg/dL (ref 8.9–10.3)
Calcium: 9.6 mg/dL (ref 8.9–10.3)
Chloride: 102 mmol/L (ref 98–111)
Chloride: 103 mmol/L (ref 98–111)
Creatinine, Ser: 1.48 mg/dL — ABNORMAL HIGH (ref 0.61–1.24)
Creatinine, Ser: 1.54 mg/dL — ABNORMAL HIGH (ref 0.61–1.24)
GFR calc Af Amer: >60 mL/min (ref >60)
GFR calc Af Amer: >60 mL/min (ref >60)
GFR calc non Af Amer: 54 mL/min — ABNORMAL LOW (ref >60)
GFR calc non Af Amer: 52 mL/min — ABNORMAL LOW (ref >60)
Glucose, Bld: 89 mg/dL (ref 70–99)
Glucose, Bld: 141 mg/dL — ABNORMAL HIGH (ref 70–99)
Potassium: 3.8 mmol/L (ref 3.5–5.1)
Potassium: 3.5 mmol/L (ref 3.5–5.1)
Sodium: 141 mmol/L (ref 135–145)
Sodium: 142 mmol/L (ref 135–145)

## 2019-02-23 LAB — LIPID PANEL
Cholesterol: 258 mg/dL — ABNORMAL HIGH (ref 0–200)
HDL: 58 mg/dL (ref >40)
LDL Cholesterol: 181 mg/dL — ABNORMAL HIGH (ref 0–99)
Total CHOL/HDL Ratio: 4.4 RATIO
Triglycerides: 97 mg/dL (ref <150)
VLDL: 19 mg/dL (ref 0–40)

## 2019-02-23 LAB — HEMOGLOBIN A1C
Hgb A1c MFr Bld: 5.4 % (ref 4.8–5.6)
Mean Plasma Glucose: 108.28 mg/dL

## 2019-02-23 LAB — MAGNESIUM: Magnesium: 2.4 mg/dL (ref 1.7–2.4)

## 2019-02-23 MED ORDER — POTASSIUM CHLORIDE CRYS ER 20 MEQ PO TBCR
40.0000 meq | EXTENDED_RELEASE_TABLET | Freq: Once | ORAL | Status: AC
  Administered 2019-02-23: 40 meq via ORAL
  Filled 2019-02-23: qty 2

## 2019-02-23 MED ORDER — SODIUM CHLORIDE 0.9% FLUSH
3.0000 mL | Freq: Two times a day (BID) | INTRAVENOUS | Status: DC
  Administered 2019-02-25: 3 mL via INTRAVENOUS

## 2019-02-23 MED ORDER — SACUBITRIL-VALSARTAN 24-26 MG PO TABS
1.0000 | ORAL_TABLET | Freq: Two times a day (BID) | ORAL | Status: DC
  Administered 2019-02-23 – 2019-02-25 (×4): 1 via ORAL
  Filled 2019-02-23 (×5): qty 1

## 2019-02-23 MED ORDER — DIGOXIN 125 MCG PO TABS
0.1250 mg | ORAL_TABLET | Freq: Every day | ORAL | Status: DC
  Administered 2019-02-23: 0.125 mg via ORAL
  Filled 2019-02-23 (×2): qty 1

## 2019-02-23 MED ORDER — ASPIRIN EC 81 MG PO TBEC
81.0000 mg | DELAYED_RELEASE_TABLET | Freq: Every day | ORAL | Status: DC
  Administered 2019-02-23 – 2019-02-25 (×2): 81 mg via ORAL
  Filled 2019-02-23 (×2): qty 1

## 2019-02-23 MED ORDER — ATORVASTATIN CALCIUM 40 MG PO TABS
40.0000 mg | ORAL_TABLET | Freq: Every day | ORAL | Status: DC
  Administered 2019-02-23 – 2019-02-24 (×2): 40 mg via ORAL
  Filled 2019-02-23 (×2): qty 1

## 2019-02-23 NOTE — TOC Initial Note (Addendum)
Transition of Care (TOC) - Initial/Assessment Note    Patient Details  Name: Nathaniel Carr. MRN: [id number] Date of Birth: 1965/10/12  Transition of Care [facility]) CM/SW Contact:    Reola Mosher Phone Number: [phone] 02/23/2019, 10:40 AM  Clinical Narrative:                 Patient lives at home with spouse; not working due to the Pandemic; no PCP, no medical insurance; pt is agreeable to go to the [facility] for [facility]; Financial counselor to see patient to determine what he might qualify for; Patient is independent of all of his ADL's, no DME at this time; Attending MD at discharge, please send scripts to [facility] pharmacy.   Expected Discharge Plan: Home/Self Care Barriers to Discharge: No Barriers Identified   Patient Goals and CMS Choice     Choice offered to / list presented to : NA  Expected Discharge Plan and Services Expected Discharge Plan: Home/Self Care In-house Referral: Financial Counselor Discharge Planning Services: NA Post Acute Care Choice: NA Living arrangements for the past 2 months: Single Family Home Expected Discharge Date: 02/23/19               DME Arranged: N/A DME Agency: NA       HH Arranged: NA HH Agency: NA        Prior Living Arrangements/Services Living arrangements for the past 2 months: Single Family Home Lives with:: Spouse Patient language and need for interpreter reviewed:: Yes        Need for Family Participation in Patient Care: No (Comment) Care giver support system in place?: No (comment)   Criminal Activity/Legal Involvement Pertinent to Current Situation/Hospitalization: No - Comment as needed  Activities of Daily Living Home Assistive Devices/Equipment: None ADL Screening (condition at time of admission) Patient's cognitive ability adequate to safely complete daily activities?: Yes Is the patient deaf or have difficulty hearing?: No Does the patient have difficulty  seeing, even when wearing glasses/contacts?: No Does the patient have difficulty concentrating, remembering, or making decisions?: No Patient able to express need for assistance with ADLs?: Yes Does the patient have difficulty dressing or bathing?: No Independently performs ADLs?: Yes (appropriate for developmental age) Does the patient have difficulty walking or climbing stairs?: No Weakness of Legs: None Weakness of Arms/Hands: None  Permission Sought/Granted Permission sought to share information with : Case Manager Permission granted to share information with : Yes, Verbal Permission Granted              Emotional Assessment Appearance:: Developmentally appropriate Attitude/Demeanor/Rapport: Gracious, Engaged Affect (typically observed): Accepting Orientation: : Oriented to Self, Oriented to  Time, Oriented to Place, Oriented to Situation Alcohol / Substance Use: Not Applicable Psych Involvement: No (comment)  Admission diagnosis:  Peripheral edema [R60.9] Dyspnea, unspecified type [R06.00] New onset of congestive heart failure (HCC) [I50.9] Patient Active Problem List   Diagnosis Date Noted  . Acute exacerbation of CHF (congestive heart failure) (HCC) 02/21/2019  . AKI (acute kidney injury) (HCC) 02/21/2019  . Elevated troponin 02/21/2019  . Dyslipidemia 02/21/2019  . DYSLIPIDEMIA 01/09/2010  . ANEMIA-NOS 12/05/2009  . Essential hypertension 12/05/2009  . HEADACHE 12/05/2009   PCP:  Patient, [name] Pharmacy:   [facility] [address] [place] - [address] [place] [address] Phone: [phone] Fax: [phone]     Social Determinants of Health (SDOH) Interventions    Readmission Risk Interventions No flowsheet  data found.

## 2019-02-23 NOTE — Progress Notes (Signed)
Received consult for Sherryll Burger co pay cost - Patient does not have medical insurance; he will follow up at the [facility], and received meds through [facility] pharmacy at discharge; coupon card will be given for [facility]. Abelino Derrick [facility] [phone]

## 2019-02-23 NOTE — TOC Benefit Eligibility Note (Signed)
Transition of Care [facility]) Benefit Eligibility Note    Patient Details  Name: Nathaniel Carr. MRN: [id number] Date of Birth: 1966-08-12   Medication/Dose: Sherryll Burger   24-26 MG BID           Spoke with Person/Company/Phone Number:: [employer]YLE([employer]  RX #  [phone])  Co-Pay: PATIENT HAS A DISCOUNT CARD ONLY        Additional Notes: NO PHARMACY BENEFIT'S ON FILE AND NO COVERAGE SELECTED FOR  THIS HOSPITAL ACCOUNT    Mardene Sayer Phone Number: 02/23/2019, 1:41 PM

## 2019-02-23 NOTE — Progress Notes (Signed)
PROGRESS NOTE  Nathaniel Carr. BLT:[id number] DOB: [birth year] DOA: 02/21/2019 PCP: Patient, [name]   LOS: 2 days   Patient is from: Home  Brief Narrative / Interim history: 53 year old male with history of hypertension, hyperlipidemia and anemia presenting with leg swelling and shortness of breath for 1 to 2 weeks and admitted for possible acute CHF.  He has no prior diagnosis of CHF. Denies NSAID use, alcohol or dietary indiscretion.  Recently ran out of his amlodipine.  He does not have PCP.  Sleeps with 2 pillow which is unchanged from baseline.  No chest pain.  Not a smoker or drinker.  Denies recreational drug use.  In ED, slightly tachycardic and hypertensive.  Good saturation on room air.  Creatinine 1.47 (unknown baseline but 1.2 in 2015).  956 (unknown baseline).  Troponin 0 0.033.  D-dimer elevated to 1.07.  Lower extremity venous Doppler negative for DVT.  CTA chest negative for PE but a small pleural effusion and mild interstitial edema. COVID-19 test negative.  Started on IV Lasix 40 mg and hospitalist service was consulted for admission.  Patient was admitted for acute CHF and started on IV Lasix 40 mg twice daily.  Subjective: Reports improvement in his breathing and swelling but not quite back to baseline.  Denies chest pain, palpitation, dizziness.  Cough resolved.  No GI or GU symptoms.   Assessment & Plan: New acute systolic CHF: has had cardinal symptoms and exam finding for CHF exacerbation.  BNP elevated to 1000 and CXR and CTA chest suggestive for CHF.  Echocardiogram on 02/22/2019 with EF of 20 to 25% and diffuse hypokinesis, severe LAE but no other significant structural or functional abnormalities.  TSH within normal range.  No recent illness. Started on IV Lasix 40 mg twice daily with excellent urine output.  Renal function stable. -Continue IV Lasix 40 mg twice daily -Appreciate cardiology input-plan for catheterization 5/19, and started digoxin,  Entresto, ASA and statin. -Daily weight, intake output and renal function -Replenish electrolytes aggressively -Consult case management for PCP  Essential hypertension: DBP elevated to 90s. -Cardiac meds as above. -Appreciate cardiology input.  Elevated d-dimer: DVT and PE excluded by venous Doppler and CTA chest.  Elevated creatinine likely CKD 3: 1.47 on admission (unknown baseline but 1.2 in 2015).  Slight uptrend with diuretics but stable. -Continue monitoring  Scheduled Meds:  aspirin EC  81 mg Oral Daily   atorvastatin  40 mg Oral q1800   digoxin  0.125 mg Oral Daily   enoxaparin (LOVENOX) injection  40 mg Subcutaneous Q24H   furosemide  40 mg Intravenous BID   loratadine  10 mg Oral Daily   sacubitril-valsartan  1 tablet Oral BID   sodium chloride flush  3 mL Intravenous Q12H   sodium chloride flush  3 mL Intravenous Q12H   Continuous Infusions:  sodium chloride     PRN Meds:.sodium chloride, acetaminophen, hydrALAZINE, ondansetron (ZOFRAN) IV, sodium chloride flush   DVT prophylaxis: Subcu Lovenox Code Status: Full code Family Communication: Attempted to call patient's wife and mother but no answer.  Did not leave voicemail. Disposition Plan: Remains inpatient for adequate diuresis and further evaluation for systolic CHF  Consultants:   None  Procedures:   None  Microbiology:  COVID-19 screen negative  Antimicrobials: Anti-infectives (From admission, onward)   None      Objective: Vitals:   02/23/19 0823 02/23/19 0936 02/23/19 1259 02/23/19 1333  BP: (!) 128/99 (!) 129/92  (!) 121/91  Pulse: Marland Kitchen)  108 (!) 102 (!) 106 98  Resp: 17   16  Temp: 98.2 F (36.8 C)   97.6 F (36.4 C)  TempSrc: Oral   Axillary  SpO2: 90%   100%  Weight:      Height:        Intake/Output Summary (Last 24 hours) at 02/23/2019 1417 Last data filed at 02/23/2019 1313 Gross per 24 hour  Intake 1577 ml  Output 3200 ml  Net -1623 ml   Filed Weights    02/21/19 1608 02/22/19 0533 02/23/19 0531  Weight: 90.7 kg 88.4 kg 86.2 kg    Examination: GENERAL: No acute distress.  Appears well.  HEENT: MMM.  Vision and hearing grossly intact.  NECK: Supple.  Positive JVD LUNGS:  No IWOB.  Good air movement bilaterally.  Minimal crackles. HEART:  RRR. Heart sounds normal.  Positive for JVD and 1+ pitting edema. ABD: Bowel sounds present. Soft. Non tender.  MSK/EXT:  Moves all extremities. No apparent deformity.  1+ pitting edema. SKIN: no apparent skin lesion or wound NEURO: Awake, alert and oriented appropriately.  No gross deficit.  PSYCH: Calm. Normal affect. Data Reviewed: I have independently reviewed following labs and imaging studies  CBC: Recent Labs  Lab 02/21/19 0938  WBC 6.6  NEUTROABS 5.2  HGB 13.9  HCT 44.4  MCV 103.0*  PLT 223   Basic Metabolic Panel: Recent Labs  Lab 02/21/19 0938 02/22/19 0456 02/23/19 0740 02/23/19 0956  NA 141 143 141 142  K 4.2 3.6 3.8 3.5  CL 112* 106 102 103  CO2 22 22 26 28   GLUCOSE 122* 104* 89 141*  BUN 22* 19 20 20   CREATININE 1.47* 1.54* 1.48* 1.54*  CALCIUM 8.6* 9.3 9.1 9.6  MG  --  2.2  --  2.4   GFR: Estimated Creatinine Clearance: 62.4 mL/min (A) (by C-G formula based on SCr of 1.54 mg/dL (H)). Liver Function Tests: Recent Labs  Lab 02/21/19 0938  AST 60*  ALT 117*  ALKPHOS 59  BILITOT 2.4*  PROT 6.1*  ALBUMIN 3.6   No results for input(s): LIPASE, AMYLASE in the last 168 hours. No results for input(s): AMMONIA in the last 168 hours. Coagulation Profile: No results for input(s): INR, PROTIME in the last 168 hours. Cardiac Enzymes: Recent Labs  Lab 02/21/19 0938 02/21/19 1617 02/21/19 2226 02/22/19 0456  TROPONINI 0.03* <0.03 0.03* 0.03*   BNP (last 3 results) No results for input(s): PROBNP in the last 8760 hours. HbA1C: Recent Labs    02/23/19 0740  HGBA1C 5.4   CBG: No results for input(s): GLUCAP in the last 168 hours. Lipid Profile: Recent Labs      02/22/19 0456 02/23/19 0740  CHOL 245* 258*  HDL 55 58  LDLCALC 171* 181*  TRIG 94 97  CHOLHDL 4.5 4.4   Thyroid Function Tests: Recent Labs    02/21/19 1617  TSH 1.213   Anemia Panel: No results for input(s): VITAMINB12, FOLATE, FERRITIN, TIBC, IRON, RETICCTPCT in the last 72 hours. Urine analysis: No results found for: COLORURINE, APPEARANCEUR, LABSPEC, PHURINE, GLUCOSEU, HGBUR, BILIRUBINUR, KETONESUR, PROTEINUR, UROBILINOGEN, NITRITE, LEUKOCYTESUR Sepsis Labs: Invalid input(s): PROCALCITONIN, LACTICIDVEN  Recent Results (from the past 240 hour(s))  SARS Coronavirus 2 (Hosp order,Performed in [facility] lab via Abbott ID)     Status: Abnormal   Collection Time: 02/21/19 11:15 AM  Result Value Ref Range Status   SARS Coronavirus 2 (Abbott ID Now) (A) NEGATIVE Final    INVALID, UNABLE TO DETERMINE THE  PRESENCE OF TARGET DNA DUE TO SPECIMEN INTEGRITY. RECOLLECTION REQUESTED.    Comment: (NOTE) Interpretive Result Comment(s): COVID 19 Positive SARS CoV 2 target nucleic acids are DETECTED. The SARS CoV 2 RNA is generally detectable in upper and lower respiratory specimens during the acute phase of infection.  Positive results are indicative of active infection with SARS CoV 2.  Clinical correlation with patient history and other diagnostic information is necessary to determine patient infection status.  Positive results do not rule out bacterial infection or coinfection with other viruses. The expected result is Negative. COVID 19 Negative SARS CoV 2 target nucleic acids are NOT DETECTED. The SARS CoV 2 RNA is generally detectable in upper and lower respiratory specimens during the acute phase of infection.  Negative results do not preclude SARS CoV 2 infection, do not rule out coinfections with other pathogens, and should not be used as the sole basis for treatment or other patient management decisions.  Negative results must be combined with clinical  observations,  patient history, and epidemiological information. The expected result is Negative. Invalid Presence or absence of SARS CoV 2 nucleic acids cannot be determined. Repeat testing was performed on the submitted specimen and repeated Invalid results were obtained.  If clinically indicated, additional testing on a new specimen with an alternate test methodology [phone]([id number]) is advised.  The SARS CoV 2 RNA is generally detectable in upper and lower respiratory specimens during the acute phase of infection. The expected result is Negative. Fact Sheet for Patients:  http://www.graves-[place].org/https://www.fda.gov/media/136524/download Fact Sheet for Healthcare Providers: EnviroConcern.sihttps://www.fda.gov/media/136523/download This test is not yet approved or cleared by the [place] FDA and has been authorized for detection and/or diagnosis of SARS CoV 2 by FDA under an Emergency Use Authorization (EUA).  This EUA will remain in effect (meaning this test can be used) for the duration of the COVID19 d eclaration under Section 564(b)(1) of the Act, 21 U.S.C. section [phone] 3(b)(1), unless the authorization is terminated or revoked sooner. Performed at [facility] [place], [address] [place], [place] [address]   SARS Coronavirus 2 (Hosp order,Performed in [facility] lab via Abbott ID)     Status: None   Collection Time: 02/21/19 12:17 PM  Result Value Ref Range Status   SARS Coronavirus 2 (Abbott ID Now) NEGATIVE NEGATIVE Final    Comment: (NOTE) Interpretive Result Comment(s): COVID 19 Positive SARS CoV 2 target nucleic acids are DETECTED. The SARS CoV 2 RNA is generally detectable in upper and lower respiratory specimens during the acute phase of infection.  Positive results are indicative of active infection with SARS CoV 2.  Clinical correlation with patient history and other diagnostic information is necessary to determine patient infection status.  Positive results do not rule out bacterial infection or  coinfection with other viruses. The expected result is Negative. COVID 19 Negative SARS CoV 2 target nucleic acids are NOT DETECTED. The SARS CoV 2 RNA is generally detectable in upper and lower respiratory specimens during the acute phase of infection.  Negative results do not preclude SARS CoV 2 infection, do not rule out coinfections with other pathogens, and should not be used as the sole basis for treatment or other patient management decisions.  Negative results must be combined with clinical  observations, patient history, and epidemiological information. The expected result is Negative. Invalid Presence or absence of SARS CoV 2 nucleic acids cannot be determined. Repeat testing was performed on the submitted specimen and repeated Invalid results were obtained.  If clinically indicated, additional testing on a new specimen with an alternate test methodology [phone]) is advised.  The SARS CoV 2 RNA is generally detectable in upper and lower respiratory specimens during the acute phase of infection. The expected result is Negative. Fact Sheet for Patients:  http://www.graves-ford.org/ Fact Sheet for Healthcare Providers: EnviroConcern.si This test is not yet approved or cleared by the [place] FDA and has been authorized for detection and/or diagnosis of SARS CoV 2 by FDA under an Emergency Use Authorization (EUA).  This EUA will remain in effect (meaning this test can be used) for the duration of the COVID19 d eclaration under Section 564(b)(1) of the Act, 21 U.S.C. section [phone] 3(b)(1), unless the authorization is terminated or revoked sooner. Performed at [facility], [address], [place], [place] [address]       Radiology Studies: No results found.  Kissy Cielo T. [facility] Pager [phone]  If 7PM-7AM, please contact night-coverage [email or web address] Password [email or web address] 02/23/2019, 2:17 PM

## 2019-02-23 NOTE — H&P (View-Only) (Signed)
Advanced Heart Failure Team Consult Note   Primary Physician: Patient, [name] PCP-Cardiologist:  No primary care provider on file.  Reason for Consultation: Acute systolic HF  HPI:    Nathaniel Gaede. is seen today for evaluation of Acute systolic HF at the request of Dr Alanda Slim.  Nathaniel Riddle. is a 53 y.o. male with a history of HTN, HLD, and anemia. No known cardiac history.  He presented to ED with 2 weeks of SOB and BLE edema. He also had dry cough, orthopnea, nausea, and vomiting. No fever, chills, or CP. He presented to Urgent Care and was sent to [facility] for further evaluation.   Pertinent admission labs include: creatinine 1.47 (unclear baseline), BNP 956, troponin 0.03 > 0.03 > 0.03, TSH normal, LDL 171, d-dimer 1.07, CTA negative for PE, COVID-19 negative.  Transferred to [facility]. He was given IV lasix and echo was ordered. He has had brisk diuresis and is down 10 lbs. Echo completed today and showed newly reduced EF 20-25%, so advanced HF team was consulted.   Echo 02/22/19: EF 20-25%, diffuse HK, RV normal, LA severely dilated, moderate MR, RA mildly dilated, moderate TR.   FH: paternal grandfather with HTN, uncle with CHF of uncertain etiology.   SH: no tobacco, ETOH, or drug use.   Review of systems complete and found to be negative unless listed in HPI.   Home Medications Prior to Admission medications   Medication Sig Start Date End Date Taking? Authorizing Provider  amLODipine (NORVASC) 10 MG tablet Take 10 mg by mouth daily.   Yes [provider]  Ascorbic Acid (VITAMIN C PO) Take 1 tablet by mouth daily.   Yes [provider]  aspirin-acetaminophen-caffeine (EXCEDRIN MIGRAINE) [phone] MG per tablet Take 2 tablets by mouth every 6 (six) hours as needed for headache or migraine.    Yes [provider]  B Complex Vitamins (VITAMIN B-COMPLEX PO) Take 1 tablet by mouth daily.   Yes [provider]   Cholecalciferol (VITAMIN D-3 PO) Take 1 capsule by mouth 2 (two) times daily after a meal.   Yes [provider]    Past Medical History: Past Medical History:  Diagnosis Date  . ANEMIA-NOS 12/05/2009  . DYSLIPIDEMIA 01/09/2010  . Headache(784.0) 12/05/2009  . Hemorrhoids   . HYPERTENSION 12/05/2009    Past Surgical History: Past Surgical History:  Procedure Laterality Date  . HEMORRHOID SURGERY      Family History: Family History  Problem Relation Age of Onset  . Hypertension Paternal Grandfather     Social History: Social History   Socioeconomic History  . Marital status: Married    Spouse name: Not on file  . Number of children: Not on file  . Years of education: Not on file  . Highest education level: Not on file  Occupational History  . Not on file  Social Needs  . Financial resource strain: Not on file  . Food insecurity:    Worry: Not on file    Inability: Not on file  . Transportation needs:    Medical: Not on file    Non-medical: Not on file  Tobacco Use  . Smoking status: Never Smoker  . Smokeless tobacco: Never Used  . 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 Alcohol use: No  . Drug use: No  . Sexual activity: Not on file  Lifestyle  . Physical  activity:    Days per week: Not on file    Minutes per session: Not on file  . Stress: Not on file  Relationships  . Social connections:    Talks on phone: Not on file    Gets together: Not on file    Attends religious service: Not on file    Active member of club or organization: Not on file    Attends meetings of clubs or organizations: Not on file    Relationship status: Not on file  Other Topics Concern  . Not on file  Social History Narrative  . Not on file    Allergies:  Allergies  Allergen Reactions  . Shellfish-Derived Products Shortness Of Breath and Nausea And Vomiting    Can tolerate grilled shrimp  .  Codeine Nausea Only  . Grass Extracts [Gramineae Pollens] Nausea Only  . Penicillins Nausea Only    Has patient had a PCN reaction causing immediate rash, facial/tongue/throat swelling, SOB or lightheadedness with hypotension: No Has patient had a PCN reaction causing severe rash involving mucus membranes or skin necrosis: No Has patient had a PCN reaction that required hospitalization: Unk Has patient had a PCN reaction occurring within the last 10 years: From childhood If all of the above answers are "NO", then may proc    Objective:    Vital Signs:   Temp:  [97.5 F (36.4 C)-98.2 F (36.8 C)] 98.2 F (36.8 C) (05/18 0823) Pulse Rate:  [96-108] 108 (05/18 0823) Resp:  [17-20] 17 (05/18 0823) BP: (106-128)/(77-107) 128/99 (05/18 0823) SpO2:  [90 %-100 %] 90 % (05/18 0823) Weight:  [86.2 kg] 86.2 kg (05/18 0531) Last BM Date: 02/21/19  Weight change: Filed Weights   02/21/19 1608 02/22/19 0533 02/23/19 0531  Weight: 90.7 kg 88.4 kg 86.2 kg    Intake/Output:   Intake/Output Summary (Last 24 hours) at 02/23/2019 0854 Last data filed at 02/23/2019 0837 Gross per 24 hour  Intake 1237 ml  Output 3275 ml  Net -2038 ml      Physical Exam    Per MD assessment.   Telemetry   NSR/sinus tach 90-100s.   EKG    Sinus tach 115 bpm with TWI in V4-V6. Personally reviewed.   Labs   Basic Metabolic Panel: Recent Labs  Lab 02/21/19 0938 02/22/19 0456  NA 141 143  K 4.2 3.6  CL 112* 106  CO2 22 22  GLUCOSE 122* 104*  BUN 22* 19  CREATININE 1.47* 1.54*  CALCIUM 8.6* 9.3  MG  --  2.2    Liver Function Tests: Recent Labs  Lab 02/21/19 0938  AST 60*  ALT 117*  ALKPHOS 59  BILITOT 2.4*  PROT 6.1*  ALBUMIN 3.6   No results for input(s): LIPASE, AMYLASE in the last 168 hours. No results for input(s): AMMONIA in the last 168 hours.  CBC: Recent Labs  Lab 02/21/19 0938  WBC 6.6  NEUTROABS 5.2  HGB 13.9  HCT 44.4  MCV 103.0*  PLT 223    Cardiac  Enzymes: Recent Labs  Lab 02/21/19 0938 02/21/19 1617 02/21/19 2226 02/22/19 0456  TROPONINI 0.03* <0.03 0.03* 0.03*    BNP: BNP (last 3 results) Recent Labs    02/21/19 0938  BNP 956.1*    ProBNP (last 3 results) No results for input(s): PROBNP in the last 8760 hours.   CBG: No results for input(s): GLUCAP in the last 168 hours.  Coagulation Studies: No results for input(s): LABPROT, INR in the last  72 hours.   Imaging    No results found.   Medications:     Current Medications: . enoxaparin (LOVENOX) injection  40 mg Subcutaneous Q24H  . furosemide  40 mg Intravenous BID  . loratadine  10 mg Oral Daily  . sodium chloride flush  3 mL Intravenous Q12H     Infusions: . sodium chloride         Patient Profile   Nathaniel Dinsmoor. is a 53 y.o. male with a history of HTN, HLD, and anemia. No known cardiac history.  Admitted with SOB and BLE edema. Echo showed newly reduced EF 20-25%.  Assessment/Plan   1. Acute systolic HF. Unclear etiology. TSH normal. He has HTN, but seems to be well controlled. HIV negative. He has several risk factors for ischemia. A1C 5.4. - Echo 02/22/19: EF 20-25%, diffuse HK, RV normal, LA severely dilated, moderate MR, RA mildly dilated, moderate TR.  - Volume per MD assessment.  - Diuresing well with 40 mg IV lasix BID - Would like to add Entresto and spiro, but will hold off with borderline creatinine and need for LHC. - Consider coreg, but with tachycardia and ?AKI, may be low output. - Add digoxin 0.125 mg daily.  - He will need R/LHC. Possibly cardiac MRI.   2. Hyperlipidemia - LDL elevated 181.  - Start atorvastatin 40 mg daily.   3. ?CKD3 vs AKI - Unclear baseline. Last creatinine (care everywhere) 1.2 in 2015 - Creatinine 1.54 today.   4. HTN - On amlodipine at home. Will manage with HF meds instead.    Medication concerns reviewed with patient and pharmacy team. Barriers identified: Needs a PCP.   Length of Stay: 2  Nathaniel Highland, NP  02/23/2019, 8:54 AM  Advanced Heart Failure Team Pager [phone] (M-F; 7a - 4p)  Please contact [facility] for night-coverage after hours (4p -7a ) and weekends on [email or web address]  Patient seen with NP, agree with the above note.  He was admitted with 3-4 weeks of dyspnea and LE edema.  No definite trigger.  No chest pain.  Has uncle with CHF of uncertain etiology.  No smoking or drinking/drugs.    General: NAD Neck: JVP 12 cm, no thyromegaly or thyroid nodule.  Lungs: Clear to auscultation bilaterally with normal respiratory effort. CV: Lateral PMI.  Heart regular S1/S2, no S3/S4, 2/6 HSM apex.  1+ ankle edema.   Abdomen: Soft, nontender, no hepatosplenomegaly, no distention.  Skin: Intact without lesions or rashes.  Neurologic: Alert and oriented x 3.  Psych: Normal affect. Extremities: No clubbing or cyanosis.  HEENT: Normal.   Assessment/Plan: 1. Acute systolic CHF: Echo with EF 20-25%, diffuse hypokinesis, normal RV, moderate MR.  Cause uncertain.  No chest pain, but cannot rule out CAD/ischemic cardiomyopathy.  Uncle has CHF, no other family members.  No ETOH/drugs.  No definite pre-existing URI/viral symptoms.  He has diuresed well so far with IV Lasix and remains volume overloaded on exam.  - Continue Lasix 40 mg IV bid, may be ready to convert over to po tomorrow.  - Add Entresto 24/26 bid.  - Digoxin added.  - Eventually would like him on Coreg and spironolactone.  - Needs right/left heart cath to assess filling pressures/cardiac output as well as look for CAD as cause of cardiomyopathy.  Will plan tomorrow.  Discussed risks/benefits of procedure with patient and he agrees.   - If no CAD on cath, will plan cardiac MRI to look for myocarditis/infiltrative disease.  2. ?CKD stage 3: Unsure of baseline, but creatinine has been 1.4-1.5 here.  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  3. HTN: Diastolic pressure elevated.  Will add Entresto as above.  4.  Hyperlipidemia: Very high LDL, agree with adding atorvastatin as above.   Nathaniel Carr 02/23/2019 12:04 PM  

## 2019-02-23 NOTE — Consult Note (Addendum)
Advanced Heart Failure Team Consult Note   Primary Physician: Patient, [name] PCP-Cardiologist:  No primary care provider on file.  Reason for Consultation: Acute systolic HF  HPI:    Nathaniel Gaede. is seen today for evaluation of Acute systolic HF at the request of Dr Alanda Slim.  Nathaniel Riddle. is a 53 y.o. male with a history of HTN, HLD, and anemia. No known cardiac history.  He presented to ED with 2 weeks of SOB and BLE edema. He also had dry cough, orthopnea, nausea, and vomiting. No fever, chills, or CP. He presented to Urgent Care and was sent to [facility] for further evaluation.   Pertinent admission labs include: creatinine 1.47 (unclear baseline), BNP 956, troponin 0.03 > 0.03 > 0.03, TSH normal, LDL 171, d-dimer 1.07, CTA negative for PE, COVID-19 negative.  Transferred to [facility]. He was given IV lasix and echo was ordered. He has had brisk diuresis and is down 10 lbs. Echo completed today and showed newly reduced EF 20-25%, so advanced HF team was consulted.   Echo 02/22/19: EF 20-25%, diffuse HK, RV normal, LA severely dilated, moderate MR, RA mildly dilated, moderate TR.   FH: paternal grandfather with HTN, uncle with CHF of uncertain etiology.   SH: no tobacco, ETOH, or drug use.   Review of systems complete and found to be negative unless listed in HPI.   Home Medications Prior to Admission medications   Medication Sig Start Date End Date Taking? Authorizing Provider  amLODipine (NORVASC) 10 MG tablet Take 10 mg by mouth daily.   Yes [provider]  Ascorbic Acid (VITAMIN C PO) Take 1 tablet by mouth daily.   Yes [provider]  aspirin-acetaminophen-caffeine (EXCEDRIN MIGRAINE) [phone] MG per tablet Take 2 tablets by mouth every 6 (six) hours as needed for headache or migraine.    Yes [provider]  B Complex Vitamins (VITAMIN B-COMPLEX PO) Take 1 tablet by mouth daily.   Yes [provider]   Cholecalciferol (VITAMIN D-3 PO) Take 1 capsule by mouth 2 (two) times daily after a meal.   Yes [provider]    Past Medical History: Past Medical History:  Diagnosis Date  . ANEMIA-NOS 12/05/2009  . DYSLIPIDEMIA 01/09/2010  . Headache(784.0) 12/05/2009  . Hemorrhoids   . HYPERTENSION 12/05/2009    Past Surgical History: Past Surgical History:  Procedure Laterality Date  . HEMORRHOID SURGERY      Family History: Family History  Problem Relation Age of Onset  . Hypertension Paternal Grandfather     Social History: Social History   Socioeconomic History  . Marital status: Married    Spouse name: Not on file  . Number of children: Not on file  . Years of education: Not on file  . Highest education level: Not on file  Occupational History  . Not on file  Social Needs  . Financial resource strain: Not on file  . Food insecurity:    Worry: Not on file    Inability: Not on file  . Transportation needs:    Medical: Not on file    Non-medical: Not on file  Tobacco Use  . Smoking status: Never Smoker  . Smokeless tobacco: Never Used  . 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 Alcohol use: No  . Drug use: No  . Sexual activity: Not on file  Lifestyle  . Physical  activity:    Days per week: Not on file    Minutes per session: Not on file  . Stress: Not on file  Relationships  . Social connections:    Talks on phone: Not on file    Gets together: Not on file    Attends religious service: Not on file    Active member of club or organization: Not on file    Attends meetings of clubs or organizations: Not on file    Relationship status: Not on file  Other Topics Concern  . Not on file  Social History Narrative  . Not on file    Allergies:  Allergies  Allergen Reactions  . Shellfish-Derived Products Shortness Of Breath and Nausea And Vomiting    Can tolerate grilled shrimp  .  Codeine Nausea Only  . Grass Extracts [Gramineae Pollens] Nausea Only  . Penicillins Nausea Only    Has patient had a PCN reaction causing immediate rash, facial/tongue/throat swelling, SOB or lightheadedness with hypotension: No Has patient had a PCN reaction causing severe rash involving mucus membranes or skin necrosis: No Has patient had a PCN reaction that required hospitalization: Unk Has patient had a PCN reaction occurring within the last 10 years: From childhood If all of the above answers are "NO", then may proc    Objective:    Vital Signs:   Temp:  [97.5 F (36.4 C)-98.2 F (36.8 C)] 98.2 F (36.8 C) (05/18 0823) Pulse Rate:  [96-108] 108 (05/18 0823) Resp:  [17-20] 17 (05/18 0823) BP: (106-128)/(77-107) 128/99 (05/18 0823) SpO2:  [90 %-100 %] 90 % (05/18 0823) Weight:  [86.2 kg] 86.2 kg (05/18 0531) Last BM Date: 02/21/19  Weight change: Filed Weights   02/21/19 1608 02/22/19 0533 02/23/19 0531  Weight: 90.7 kg 88.4 kg 86.2 kg    Intake/Output:   Intake/Output Summary (Last 24 hours) at 02/23/2019 0854 Last data filed at 02/23/2019 0837 Gross per 24 hour  Intake 1237 ml  Output 3275 ml  Net -2038 ml      Physical Exam    Per MD assessment.   Telemetry   NSR/sinus tach 90-100s.   EKG    Sinus tach 115 bpm with TWI in V4-V6. Personally reviewed.   Labs   Basic Metabolic Panel: Recent Labs  Lab 02/21/19 0938 02/22/19 0456  NA 141 143  K 4.2 3.6  CL 112* 106  CO2 22 22  GLUCOSE 122* 104*  BUN 22* 19  CREATININE 1.47* 1.54*  CALCIUM 8.6* 9.3  MG  --  2.2    Liver Function Tests: Recent Labs  Lab 02/21/19 0938  AST 60*  ALT 117*  ALKPHOS 59  BILITOT 2.4*  PROT 6.1*  ALBUMIN 3.6   No results for input(s): LIPASE, AMYLASE in the last 168 hours. No results for input(s): AMMONIA in the last 168 hours.  CBC: Recent Labs  Lab 02/21/19 0938  WBC 6.6  NEUTROABS 5.2  HGB 13.9  HCT 44.4  MCV 103.0*  PLT 223    Cardiac  Enzymes: Recent Labs  Lab 02/21/19 0938 02/21/19 1617 02/21/19 2226 02/22/19 0456  TROPONINI 0.03* <0.03 0.03* 0.03*    BNP: BNP (last 3 results) Recent Labs    02/21/19 0938  BNP 956.1*    ProBNP (last 3 results) No results for input(s): PROBNP in the last 8760 hours.   CBG: No results for input(s): GLUCAP in the last 168 hours.  Coagulation Studies: No results for input(s): LABPROT, INR in the last  72 hours.   Imaging    No results found.   Medications:     Current Medications: . enoxaparin (LOVENOX) injection  40 mg Subcutaneous Q24H  . furosemide  40 mg Intravenous BID  . loratadine  10 mg Oral Daily  . sodium chloride flush  3 mL Intravenous Q12H     Infusions: . sodium chloride         Patient Profile   Nathaniel Dinsmoor. is a 53 y.o. male with a history of HTN, HLD, and anemia. No known cardiac history.  Admitted with SOB and BLE edema. Echo showed newly reduced EF 20-25%.  Assessment/Plan   1. Acute systolic HF. Unclear etiology. TSH normal. He has HTN, but seems to be well controlled. HIV negative. He has several risk factors for ischemia. A1C 5.4. - Echo 02/22/19: EF 20-25%, diffuse HK, RV normal, LA severely dilated, moderate MR, RA mildly dilated, moderate TR.  - Volume per MD assessment.  - Diuresing well with 40 mg IV lasix BID - Would like to add Entresto and spiro, but will hold off with borderline creatinine and need for LHC. - Consider coreg, but with tachycardia and ?AKI, may be low output. - Add digoxin 0.125 mg daily.  - He will need R/LHC. Possibly cardiac MRI.   2. Hyperlipidemia - LDL elevated 181.  - Start atorvastatin 40 mg daily.   3. ?CKD3 vs AKI - Unclear baseline. Last creatinine (care everywhere) 1.2 in 2015 - Creatinine 1.54 today.   4. HTN - On amlodipine at home. Will manage with HF meds instead.    Medication concerns reviewed with patient and pharmacy team. Barriers identified: Needs a PCP.   Length of Stay: 2  Alford Highland, NP  02/23/2019, 8:54 AM  Advanced Heart Failure Team Pager [phone] (M-F; 7a - 4p)  Please contact [facility] for night-coverage after hours (4p -7a ) and weekends on [email or web address]  Patient seen with NP, agree with the above note.  He was admitted with 3-4 weeks of dyspnea and LE edema.  No definite trigger.  No chest pain.  Has uncle with CHF of uncertain etiology.  No smoking or drinking/drugs.    General: NAD Neck: JVP 12 cm, no thyromegaly or thyroid nodule.  Lungs: Clear to auscultation bilaterally with normal respiratory effort. CV: Lateral PMI.  Heart regular S1/S2, no S3/S4, 2/6 HSM apex.  1+ ankle edema.   Abdomen: Soft, nontender, no hepatosplenomegaly, no distention.  Skin: Intact without lesions or rashes.  Neurologic: Alert and oriented x 3.  Psych: Normal affect. Extremities: No clubbing or cyanosis.  HEENT: Normal.   Assessment/Plan: 1. Acute systolic CHF: Echo with EF 20-25%, diffuse hypokinesis, normal RV, moderate MR.  Cause uncertain.  No chest pain, but cannot rule out CAD/ischemic cardiomyopathy.  Uncle has CHF, no other family members.  No ETOH/drugs.  No definite pre-existing URI/viral symptoms.  He has diuresed well so far with IV Lasix and remains volume overloaded on exam.  - Continue Lasix 40 mg IV bid, may be ready to convert over to po tomorrow.  - Add Entresto 24/26 bid.  - Digoxin added.  - Eventually would like him on Coreg and spironolactone.  - Needs right/left heart cath to assess filling pressures/cardiac output as well as look for CAD as cause of cardiomyopathy.  Will plan tomorrow.  Discussed risks/benefits of procedure with patient and he agrees.   - If no CAD on cath, will plan cardiac MRI to look for myocarditis/infiltrative disease.  2. ?CKD stage 3: Unsure of baseline, but creatinine has been 1.4-1.5 here.  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  3. HTN: Diastolic pressure elevated.  Will add Entresto as above.  4.  Hyperlipidemia: Very high LDL, agree with adding atorvastatin as above.   Marca Ancona 02/23/2019 12:04 PM

## 2019-02-24 ENCOUNTER — Encounter (HOSPITAL_COMMUNITY)
Admission: EM | Disposition: A | Discharge status: Home / Self Care | Payer: Self-pay | Source: Home / Self Care | Attending: Student

## 2019-02-24 ENCOUNTER — Encounter (HOSPITAL_COMMUNITY): Payer: Self-pay | Admitting: Cardiology

## 2019-02-24 DIAGNOSIS — I509 Heart failure, unspecified: Secondary | ICD-10-CM

## 2019-02-24 DIAGNOSIS — I429 Cardiomyopathy, unspecified: Secondary | ICD-10-CM

## 2019-02-24 HISTORY — PX: RIGHT/LEFT HEART CATH AND CORONARY ANGIOGRAPHY: CATH118266

## 2019-02-24 LAB — CBC
HCT: 50.4 % (ref 39.0–52.0)
Hemoglobin: 15.9 g/dL (ref 13.0–17.0)
MCH: 32.4 pg (ref 26.0–34.0)
MCHC: 31.5 g/dL (ref 30.0–36.0)
MCV: 102.6 fL — ABNORMAL HIGH (ref 80.0–100.0)
Platelets: 246 10*3/uL (ref 150–400)
RBC: 4.91 MIL/uL (ref 4.22–5.81)
RDW: 15.2 % (ref 11.5–15.5)
WBC: 6.9 10*3/uL (ref 4.0–10.5)
nRBC: 0 % (ref 0.0–0.2)

## 2019-02-24 LAB — POCT I-STAT EG7
Acid-Base Excess: 1 mmol/L (ref 0.0–2.0)
Acid-Base Excess: 2 mmol/L (ref 0.0–2.0)
Bicarbonate: 26.1 mmol/L (ref 20.0–28.0)
Bicarbonate: 27 mmol/L (ref 20.0–28.0)
Calcium, Ion: 1.18 mmol/L (ref 1.15–1.40)
Calcium, Ion: 1.2 mmol/L (ref 1.15–1.40)
HCT: 50 % (ref 39.0–52.0)
HCT: 50 % (ref 39.0–52.0)
Hemoglobin: 17 g/dL (ref 13.0–17.0)
Hemoglobin: 17 g/dL (ref 13.0–17.0)
O2 Saturation: 72 %
O2 Saturation: 73 %
Potassium: 3.8 mmol/L (ref 3.5–5.1)
Potassium: 3.9 mmol/L (ref 3.5–5.1)
Sodium: 138 mmol/L (ref 135–145)
Sodium: 138 mmol/L (ref 135–145)
TCO2: 27 mmol/L (ref 22–32)
TCO2: 28 mmol/L (ref 22–32)
pCO2, Ven: 41.1 mmHg — ABNORMAL LOW (ref 44.0–60.0)
pCO2, Ven: 42.5 mmHg — ABNORMAL LOW (ref 44.0–60.0)
pH, Ven: 7.41 (ref 7.250–7.430)
pH, Ven: 7.411 (ref 7.250–7.430)
pO2, Ven: 38 mmHg (ref 32.0–45.0)
pO2, Ven: 38 mmHg (ref 32.0–45.0)

## 2019-02-24 LAB — HEPATIC FUNCTION PANEL
ALT: 74 U/L — ABNORMAL HIGH (ref 0–44)
AST: 32 U/L (ref 15–41)
Albumin: 3.1 g/dL — ABNORMAL LOW (ref 3.5–5.0)
Alkaline Phosphatase: 54 U/L (ref 38–126)
Bilirubin, Direct: 0.4 mg/dL — ABNORMAL HIGH (ref 0.0–0.2)
Indirect Bilirubin: 1.6 mg/dL — ABNORMAL HIGH (ref 0.3–0.9)
Total Bilirubin: 2 mg/dL — ABNORMAL HIGH (ref 0.3–1.2)
Total Protein: 5.5 g/dL — ABNORMAL LOW (ref 6.5–8.1)

## 2019-02-24 LAB — MAGNESIUM: Magnesium: 2.3 mg/dL (ref 1.7–2.4)

## 2019-02-24 LAB — BASIC METABOLIC PANEL
Anion gap: 11 (ref 5–15)
BUN: 17 mg/dL (ref 6–20)
CO2: 25 mmol/L (ref 22–32)
Calcium: 8.7 mg/dL — ABNORMAL LOW (ref 8.9–10.3)
Chloride: 102 mmol/L (ref 98–111)
Creatinine, Ser: 1.26 mg/dL — ABNORMAL HIGH (ref 0.61–1.24)
GFR calc Af Amer: >60 mL/min (ref >60)
GFR calc non Af Amer: >60 mL/min (ref >60)
Glucose, Bld: 107 mg/dL — ABNORMAL HIGH (ref 70–99)
Potassium: 3.9 mmol/L (ref 3.5–5.1)
Sodium: 138 mmol/L (ref 135–145)

## 2019-02-24 SURGERY — RIGHT/LEFT HEART CATH AND CORONARY ANGIOGRAPHY
Anesthesia: LOCAL

## 2019-02-24 MED ORDER — HEPARIN SODIUM (PORCINE) 1000 UNIT/ML IJ SOLN
INTRAMUSCULAR | Status: AC
  Filled 2019-02-24: qty 1

## 2019-02-24 MED ORDER — SODIUM CHLORIDE 0.9 % IV SOLN: INTRAVENOUS | Status: DC

## 2019-02-24 MED ORDER — SODIUM CHLORIDE 0.9 % IV SOLN
Freq: Once | INTRAVENOUS | Status: AC
  Administered 2019-02-24: 14:00:00 via INTRAVENOUS

## 2019-02-24 MED ORDER — FENTANYL CITRATE (PF) 100 MCG/2ML IJ SOLN
INTRAMUSCULAR | Status: DC | PRN
  Administered 2019-02-24: 50 ug via INTRAVENOUS

## 2019-02-24 MED ORDER — HEPARIN (PORCINE) IN NACL 1000-0.9 UT/500ML-% IV SOLN
INTRAVENOUS | Status: AC
  Filled 2019-02-24: qty 1000

## 2019-02-24 MED ORDER — MIDAZOLAM HCL 2 MG/2ML IJ SOLN
INTRAMUSCULAR | Status: AC
  Filled 2019-02-24: qty 2

## 2019-02-24 MED ORDER — SODIUM CHLORIDE 0.9% FLUSH: 3.0000 mL | INTRAVENOUS | Status: DC | PRN

## 2019-02-24 MED ORDER — HEPARIN SODIUM (PORCINE) 5000 UNIT/ML IJ SOLN
5000.0000 [IU] | Freq: Three times a day (TID) | INTRAMUSCULAR | Status: DC
  Administered 2019-02-24 – 2019-02-25 (×2): 5000 [IU] via SUBCUTANEOUS
  Filled 2019-02-24 (×2): qty 1

## 2019-02-24 MED ORDER — CARVEDILOL 3.125 MG PO TABS
3.1250 mg | ORAL_TABLET | Freq: Two times a day (BID) | ORAL | Status: DC
  Administered 2019-02-25: 3.125 mg via ORAL
  Filled 2019-02-24: qty 1

## 2019-02-24 MED ORDER — LIDOCAINE HCL (PF) 1 % IJ SOLN
INTRAMUSCULAR | Status: AC
  Filled 2019-02-24: qty 30

## 2019-02-24 MED ORDER — SODIUM CHLORIDE 0.9 % IV SOLN: 250.0000 mL | INTRAVENOUS | Status: DC | PRN

## 2019-02-24 MED ORDER — SODIUM CHLORIDE 0.9 % IV SOLN
250.0000 mL | INTRAVENOUS | Status: DC | PRN
  Administered 2019-02-24: 250 mL via INTRAVENOUS

## 2019-02-24 MED ORDER — LIDOCAINE HCL (PF) 1 % IJ SOLN
INTRAMUSCULAR | Status: DC | PRN
  Administered 2019-02-24 (×2): 2 mL

## 2019-02-24 MED ORDER — ACETAMINOPHEN 325 MG PO TABS: 650.0000 mg | ORAL_TABLET | ORAL | Status: DC | PRN

## 2019-02-24 MED ORDER — HYDRALAZINE HCL 20 MG/ML IJ SOLN: 10.0000 mg | INTRAMUSCULAR | Status: AC | PRN

## 2019-02-24 MED ORDER — FENTANYL CITRATE (PF) 100 MCG/2ML IJ SOLN
INTRAMUSCULAR | Status: AC
  Filled 2019-02-24: qty 2

## 2019-02-24 MED ORDER — ONDANSETRON HCL 4 MG/2ML IJ SOLN: 4.0000 mg | Freq: Four times a day (QID) | INTRAMUSCULAR | Status: DC | PRN

## 2019-02-24 MED ORDER — IOHEXOL 350 MG/ML SOLN
INTRAVENOUS | Status: DC | PRN
  Administered 2019-02-24: 09:00:00 30 mL via INTRAVENOUS

## 2019-02-24 MED ORDER — HEPARIN SODIUM (PORCINE) 1000 UNIT/ML IJ SOLN
INTRAMUSCULAR | Status: DC | PRN
  Administered 2019-02-24: 4000 [IU] via INTRAVENOUS

## 2019-02-24 MED ORDER — MIDAZOLAM HCL 2 MG/2ML IJ SOLN
INTRAMUSCULAR | Status: DC | PRN
  Administered 2019-02-24: 1 mg via INTRAVENOUS

## 2019-02-24 MED ORDER — SPIRONOLACTONE 12.5 MG HALF TABLET: 12.5000 mg | ORAL_TABLET | Freq: Every day | ORAL | Status: DC

## 2019-02-24 MED ORDER — LABETALOL HCL 5 MG/ML IV SOLN: 10.0000 mg | INTRAVENOUS | Status: AC | PRN

## 2019-02-24 MED ORDER — ASPIRIN 81 MG PO CHEW
81.0000 mg | CHEWABLE_TABLET | ORAL | Status: AC
  Administered 2019-02-24: 81 mg via ORAL
  Filled 2019-02-24: qty 1

## 2019-02-24 MED ORDER — SODIUM CHLORIDE 0.9% FLUSH
3.0000 mL | Freq: Two times a day (BID) | INTRAVENOUS | Status: DC
  Administered 2019-02-24 – 2019-02-25 (×2): 3 mL via INTRAVENOUS

## 2019-02-24 MED ORDER — VERAPAMIL HCL 2.5 MG/ML IV SOLN
INTRAVENOUS | Status: DC | PRN
  Administered 2019-02-24: 09:00:00 via INTRA_ARTERIAL

## 2019-02-24 MED ORDER — HEPARIN (PORCINE) IN NACL 1000-0.9 UT/500ML-% IV SOLN
INTRAVENOUS | Status: DC | PRN
  Administered 2019-02-24 (×2): 500 mL

## 2019-02-24 MED ORDER — VERAPAMIL HCL 2.5 MG/ML IV SOLN
INTRAVENOUS | Status: AC
  Filled 2019-02-24: qty 2

## 2019-02-24 SURGICAL SUPPLY — 14 items
CATH BALLN WEDGE 5F 110CM (CATHETERS) ×1 IMPLANT
CATH INFINITI 5 FR JL3.5 (CATHETERS) ×1 IMPLANT
CATH INFINITI 5FR ANG PIGTAIL (CATHETERS) ×1 IMPLANT
CATH INFINITI JR4 5F (CATHETERS) ×1 IMPLANT
COVER DOME SNAP 22 D (MISCELLANEOUS) ×1 IMPLANT
DEVICE RAD COMP TR BAND LRG (VASCULAR PRODUCTS) ×1 IMPLANT
GLIDESHEATH SLEND SS 6F .021 (SHEATH) ×1 IMPLANT
GUIDEWIRE INQWIRE 1.5J.035X260 (WIRE) IMPLANT
INQWIRE 1.5J .035X260CM (WIRE) ×2
KIT HEART LEFT (KITS) ×2 IMPLANT
PACK CARDIAC CATHETERIZATION (CUSTOM PROCEDURE TRAY) ×2 IMPLANT
SHEATH GLIDE SLENDER 4/5FR (SHEATH) ×1 IMPLANT
TRANSDUCER W/STOPCOCK (MISCELLANEOUS) ×2 IMPLANT
TUBING CIL FLEX 10 FLL-RA (TUBING) ×2 IMPLANT

## 2019-02-24 NOTE — Progress Notes (Signed)
Patient ID: Nathaniel Farrer., male   DOB: 1966/03/27, 53 y.o.   MRN: [id number]     Advanced Heart Failure Rounding Note  PCP-Cardiologist: No primary care provider on file.   Subjective:    Breathing better today, diuresed well and weight down.  Creatinine stable.  SBP in 110s.    RHC/LHC (5/19):  Coronary Findings  Diagnostic  Dominance: Right  Left Main  No significant coronary disease.  Left Anterior Descending  No significant coronary disease. Slow flow.  Ramus Intermedius  Moderate vessel, no significant disease.  Left Circumflex  No significant coronary disease. Slow flow.  Right Coronary Artery  No significant coronary disease. Slow flow.  Intervention   No interventions have been documented.  Right Heart   Right Heart Pressures RHC Procedural Findings: Hemodynamics (mmHg) RA 4 RV 28/6 PA 29/17 mean 21 PCWP mean 10 LV 92/11 AO 97/78  Oxygen saturations: PA 73% AO 97%      Objective:   Weight Range: 84.9 kg Body mass index is 27.64 kg/m.   Vital Signs:   Temp:  [97.6 F (36.4 C)-98 F (36.7 C)] 98 F (36.7 C) (05/19 0538) Pulse Rate:  [87-107] 89 (05/19 0925) Resp:  [10-19] 15 (05/19 0925) BP: (104-134)/(74-100) 128/75 (05/19 0925) SpO2:  [93 %-100 %] 100 % (05/19 0925) Weight:  [84.9 kg] 84.9 kg (05/19 0538) Last BM Date: 02/23/19  Weight change: Filed Weights   02/22/19 0533 02/23/19 0531 02/24/19 0538  Weight: 88.4 kg 86.2 kg 84.9 kg    Intake/Output:   Intake/Output Summary (Last 24 hours) at 02/24/2019 0945 Last data filed at 02/24/2019 0500 Gross per 24 hour  Intake 920 ml  Output 4850 ml  Net -3930 ml      Physical Exam    General:  Well appearing. No resp difficulty HEENT: Normal Neck: Supple. JVP not elevated. Carotids 2+ bilat; no bruits. No lymphadenopathy or thyromegaly appreciated. Cor: PMI nondisplaced. Regular rate & rhythm. No rubs, gallops or murmurs. Lungs: Clear Abdomen: Soft, nontender, nondistended.  No hepatosplenomegaly. No bruits or masses. Good bowel sounds. Extremities: No cyanosis, clubbing, rash, edema Neuro: Alert & orientedx3, cranial nerves grossly intact. moves all 4 extremities w/o difficulty. Affect pleasant   Telemetry   NSR, personally reviewed  Labs    CBC Recent Labs    02/24/19 0909  HGB 17.0  17.0  HCT 50.0  50.0   Basic Metabolic Panel Recent Labs    40/97/35 0956 02/24/19 0503 02/24/19 0909  NA 142 138 138  138  K 3.5 3.9 3.8  3.9  CL 103 102  --   CO2 28 25  --   GLUCOSE 141* 107*  --   BUN 20 17  --   CREATININE 1.54* 1.26*  --   CALCIUM 9.6 8.7*  --   MG 2.4 2.3  --    Liver Function Tests Recent Labs    02/24/19 0503  AST 32  ALT 74*  ALKPHOS 54  BILITOT 2.0*  PROT 5.5*  ALBUMIN 3.1*   No results for input(s): LIPASE, AMYLASE in the last 72 hours. Cardiac Enzymes Recent Labs    02/21/19 1617 02/21/19 2226 02/22/19 0456  TROPONINI <0.03 0.03* 0.03*    BNP: BNP (last 3 results) Recent Labs    02/21/19 0938  BNP 956.1*    ProBNP (last 3 results) No results for input(s): PROBNP in the last 8760 hours.   D-Dimer Recent Labs    02/21/19 1006  DDIMER 1.07*  Hemoglobin A1C Recent Labs    02/23/19 0740  HGBA1C 5.4   Fasting Lipid Panel Recent Labs    02/23/19 0740  CHOL 258*  HDL 58  LDLCALC 181*  TRIG 97  CHOLHDL 4.4   Thyroid Function Tests Recent Labs    02/21/19 1617  TSH 1.213    Other results:   Imaging     No results found.   Medications:     Scheduled Medications: . [MAR Hold] aspirin EC  81 mg Oral Daily  . [MAR Hold] atorvastatin  40 mg Oral q1800  . carvedilol  3.125 mg Oral BID WC  . [MAR Hold] enoxaparin (LOVENOX) injection  40 mg Subcutaneous Q24H  . [MAR Hold] loratadine  10 mg Oral Daily  . [MAR Hold] sacubitril-valsartan  1 tablet Oral BID  . [MAR Hold] sodium chloride flush  3 mL Intravenous Q12H  . [MAR Hold] sodium chloride flush  3 mL Intravenous Q12H  .  sodium chloride flush  3 mL Intravenous Q12H     Infusions: . [MAR Hold] sodium chloride    . sodium chloride 250 mL (02/24/19 0644)  . [START ON 02/25/2019] sodium chloride       PRN Medications:  [MAR Hold] sodium chloride, sodium chloride, [MAR Hold] acetaminophen, [MAR Hold] ondansetron (ZOFRAN) IV, [MAR Hold] sodium chloride flush, sodium chloride flush    Assessment/Plan   1. Acute systolic CHF: Echo with EF 20-25%, diffuse hypokinesis, normal RV, moderate MR.  Cause uncertain.  Cath today showed no significant coronary disease.  Uncle has CHF, no other family members.  No ETOH/drugs.  No definite pre-existing URI/viral symptoms.  RHC today shows optimized filling pressures and good cardiac output. He diuresed well yesterday.  - Hold Lasix today, start po tomorrow.  - Continue Entresto 24/26 bid.  - Stop digoxin with good cardiac output.  Will add Coreg 3.125 mg bid.  - Eventually would like him on spironolactone.  - Cardiac MRI today to look for infiltrative disease/myocarditis.  2. ?CKD stage 3: Unsure of baseline.  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 Creatinine 1.26 today.  3. HTN: BP controlled with cardiac meds.  4. Hyperlipidemia: Very high LDL, started atorvastatin.  Hopefully home tomorrow.    Length of Stay: 3  Marca Ancona, MD  02/24/2019, 9:45 AM  Advanced Heart Failure Team Pager [phone] (M-F; 7a - 4p)  Please contact [facility] for night-coverage after hours (4p -7a ) and weekends on [email or web address]

## 2019-02-24 NOTE — Interval H&P Note (Signed)
History and Physical Interval Note:  02/24/2019 9:34 AM  Nathaniel Carr.  has presented today for surgery, with the diagnosis of Congestive heart failure.  The various methods of treatment have been discussed with the patient and family. After consideration of risks, benefits and other options for treatment, the patient has consented to  Procedure(s): RIGHT/LEFT HEART CATH AND CORONARY ANGIOGRAPHY (N/A) as a surgical intervention.  The patient's history has been reviewed, patient examined, no change in status, stable for surgery.  I have reviewed the patient's chart and labs.  Questions were answered to the patient's satisfaction.     Harwood Nall [employer]

## 2019-02-24 NOTE — Progress Notes (Signed)
PROGRESS NOTE  Nathaniel Carr. WUJ:[id number] DOB: 08-30-1966 DOA: 02/21/2019 PCP: Patient, [name]   LOS: 3 days   Patient is from: Home  Brief Narrative / Interim history: 53 year old male with history of hypertension, hyperlipidemia and anemia presenting with leg swelling and shortness of breath for 1 to 2 weeks and admitted for possible acute CHF.  He has no prior diagnosis of CHF. Denies NSAID use, alcohol or dietary indiscretion.  Recently ran out of his amlodipine.  He does not have PCP.  Sleeps with 2 pillow which is unchanged from baseline.  No chest pain.  Not a smoker or drinker.  Denies recreational drug use.  In ED, slightly tachycardic and hypertensive.  Good saturation on room air.  Creatinine 1.47 (unknown baseline but 1.2 in 2015).  956 (unknown baseline).  Troponin 0 0.033.  D-dimer elevated to 1.07.  Lower extremity venous Doppler negative for DVT.  CTA chest negative for PE but a small pleural effusion and mild interstitial edema. COVID-19 test negative.  Started on IV Lasix 40 mg and hospitalist service was consulted for admission.  Patient was admitted for acute CHF and started on IV Lasix 40 mg twice daily.  Echocardiogram Echocardiogram on 02/22/2019 with EF of 20 to 25% and diffuse hypokinesis, severe LAE but no other significant structural or functional abnormalities.  LHC did not reveal significant CAD.  RHC showed optimal filling pressures and good cardiac output.   Subjective: No major events overnight of this morning.  He had left and right heart catheterization this morning which was not impressive.  He has no complaints.  Denies dyspnea, chest pain, palpitation or dizziness  Assessment & Plan: New acute systolic CHF: has had cardinal symptoms and exam finding for CHF exacerbation.  BNP elevated to 1000 and CXR and CTA chest suggestive for CHF.  Echocardiogram on 02/22/2019 with EF of 20 to 25% and diffuse hypokinesis, severe LAE but no other significant  structural or functional abnormalities.  LHC and RHC not impressive.  TSH within normal range.  No recent illness. Started on IV Lasix 40 mg twice daily with excellent urine output.  Net -8 L so far.  Weight 13 pounds down.  Renal function improved. -Diuretics on hold per cardiology.  Plan to start p.o. on 5/20. -Digoxin discontinued by cardiology. -Continue Entresto, Coreg, ASA and statin. -Plan for cardiac MRI to exclude infiltrative disease/myocarditis. -Daily weight, intake output and renal function -Replenish electrolytes aggressively -Case management for PCP  Essential hypertension: Normotensive. -Cardiac meds as above. -Appreciate cardiology input.  Elevated d-dimer: DVT and PE excluded by venous Doppler and CTA chest.  Elevated creatinine likely CKD 3: 1.47 on admission (unknown baseline but 1.2 in 2015).  Improved. -Continue monitoring  Scheduled Meds: . aspirin EC  81 mg Oral Daily  . atorvastatin  40 mg Oral q1800  . digoxin  0.125 mg Oral Daily  . enoxaparin (LOVENOX) injection  40 mg Subcutaneous Q24H  . furosemide  40 mg Intravenous BID  . loratadine  10 mg Oral Daily  . sacubitril-valsartan  1 tablet Oral BID  . sodium chloride flush  3 mL Intravenous Q12H  . sodium chloride flush  3 mL Intravenous Q12H   Continuous Infusions: . sodium chloride    . sodium chloride 250 mL (02/24/19 0644)  . [START ON 02/25/2019] sodium chloride     PRN Meds:.sodium chloride, sodium chloride, acetaminophen, ondansetron (ZOFRAN) IV, sodium chloride flush, sodium chloride flush   DVT prophylaxis: Subcu Lovenox Code Status:  Full code Family Communication: Attempted to call patient's wife and mother but no answer.  Did not leave voicemail. Disposition Plan: Remains inpatient for further evaluation of new CHF  Consultants:   None  Procedures:   None  Microbiology: . COVID-19 screen negative  Antimicrobials: Anti-infectives (From admission, onward)   None       Objective: Vitals:   02/23/19 1922 02/24/19 0153 02/24/19 0536 02/24/19 0538  BP: 124/87 (!) 117/100 104/88 104/87  Pulse: (!) 103 99 92 89  Resp: 18  18 18   Temp: 97.8 F (36.6 C)  98 F (36.7 C) 98 F (36.7 C)  TempSrc: Oral   Oral  SpO2: 98%  100% 98%  Weight:    84.9 kg  Height:        Intake/Output Summary (Last 24 hours) at 02/24/2019 0736 Last data filed at 02/24/2019 0500 Gross per 24 hour  Intake 1357 ml  Output 4850 ml  Net -3493 ml   Filed Weights   02/22/19 0533 02/23/19 0531 02/24/19 0538  Weight: 88.4 kg 86.2 kg 84.9 kg    Examination: GENERAL: No acute distress.  Appears well.  HEENT: MMM.  Vision and hearing grossly intact.  NECK: Supple.  Positive for JVD LUNGS:  No IWOB. Good air movement bilaterally. HEART:  RRR. Heart sounds normal.  JVD.  Trace edema. ABD: Bowel sounds present. Soft. Non tender.  MSK/EXT:  Moves all extremities. No apparent deformity.  Trace edema SKIN: no apparent skin lesion or wound NEURO: Awake, alert and oriented appropriately.  No gross deficit.  PSYCH: Calm. Normal affect.  Data Reviewed: I have independently reviewed following labs and imaging studies  CBC: Recent Labs  Lab 02/21/19 0938  WBC 6.6  NEUTROABS 5.2  HGB 13.9  HCT 44.4  MCV 103.0*  PLT 223   Basic Metabolic Panel: Recent Labs  Lab 02/21/19 0938 02/22/19 0456 02/23/19 0740 02/23/19 0956 02/24/19 0503  NA 141 143 141 142 138  K 4.2 3.6 3.8 3.5 3.9  CL 112* 106 102 103 102  CO2 22 22 26 28 25   GLUCOSE 122* 104* 89 141* 107*  BUN 22* 19 20 20 17   CREATININE 1.47* 1.54* 1.48* 1.54* 1.26*  CALCIUM 8.6* 9.3 9.1 9.6 8.7*  MG  --  2.2  --  2.4 2.3   GFR: Estimated Creatinine Clearance: 75.8 mL/min (A) (by C-G formula based on SCr of 1.26 mg/dL (H)). Liver Function Tests: Recent Labs  Lab 02/21/19 0938 02/24/19 0503  AST 60* 32  ALT 117* 74*  ALKPHOS 59 54  BILITOT 2.4* 2.0*  PROT 6.1* 5.5*  ALBUMIN 3.6 3.1*   No results for input(s):  LIPASE, AMYLASE in the last 168 hours. No results for input(s): AMMONIA in the last 168 hours. Coagulation Profile: No results for input(s): INR, PROTIME in the last 168 hours. Cardiac Enzymes: Recent Labs  Lab 02/21/19 0938 02/21/19 1617 02/21/19 2226 02/22/19 0456  TROPONINI 0.03* <0.03 0.03* 0.03*   BNP (last 3 results) No results for input(s): PROBNP in the last 8760 hours. HbA1C: Recent Labs    02/23/19 0740  HGBA1C 5.4   CBG: No results for input(s): GLUCAP in the last 168 hours. Lipid Profile: Recent Labs    02/22/19 0456 02/23/19 0740  CHOL 245* 258*  HDL 55 58  LDLCALC 171* 181*  TRIG 94 97  CHOLHDL 4.5 4.4   Thyroid Function Tests: Recent Labs    02/21/19 1617  TSH 1.213   Anemia Panel: No results for  input(s): VITAMINB12, FOLATE, FERRITIN, TIBC, IRON, RETICCTPCT in the last 72 hours. Urine analysis: No results found for: COLORURINE, APPEARANCEUR, LABSPEC, PHURINE, GLUCOSEU, HGBUR, BILIRUBINUR, KETONESUR, PROTEINUR, UROBILINOGEN, NITRITE, LEUKOCYTESUR Sepsis Labs: Invalid input(s): PROCALCITONIN, LACTICIDVEN  Recent Results (from the past 240 hour(s))  SARS Coronavirus 2 (Hosp order,Performed in [facility] lab via [employer] ID)     Status: Abnormal   Collection Time: 02/21/19 11:15 AM  Result Value Ref Range Status   SARS Coronavirus 2 ([employer] ID Now) (A) NEGATIVE Final    INVALID, UNABLE TO DETERMINE THE PRESENCE OF TARGET DNA DUE TO SPECIMEN INTEGRITY. RECOLLECTION REQUESTED.    Comment: (NOTE) Interpretive Result Comment(s): COVID 19 Positive SARS CoV 2 target nucleic acids are DETECTED. The SARS CoV 2 RNA is generally detectable in upper and lower respiratory specimens during the acute phase of infection.  Positive results are indicative of active infection with SARS CoV 2.  Clinical correlation with patient history and other diagnostic information is necessary to determine patient infection status.  Positive results do not rule out bacterial  infection or coinfection with other viruses. The expected result is Negative. COVID 19 Negative SARS CoV 2 target nucleic acids are NOT DETECTED. The SARS CoV 2 RNA is generally detectable in upper and lower respiratory specimens during the acute phase of infection.  Negative results do not preclude SARS CoV 2 infection, do not rule out coinfections with other pathogens, and should not be used as the sole basis for treatment or other patient management decisions.  Negative results must be combined with clinical  observations, patient history, and epidemiological information. The expected result is Negative. Invalid Presence or absence of SARS CoV 2 nucleic acids cannot be determined. Repeat testing was performed on the submitted specimen and repeated Invalid results were obtained.  If clinically indicated, additional testing on a new specimen with an alternate test methodology [phone]) is advised.  The SARS CoV 2 RNA is generally detectable in upper and lower respiratory specimens during the acute phase of infection. The expected result is Negative. Fact Sheet for Patients:  http://www.graves-ford.org/ Fact Sheet for Healthcare Providers: EnviroConcern.si This test is not yet approved or cleared by the [place] FDA and has been authorized for detection and/or diagnosis of SARS CoV 2 by FDA under an Emergency Use Authorization (EUA).  This EUA will remain in effect (meaning this test can be used) for the duration of the COVID19 d eclaration under Section 564(b)(1) of the Act, 21 U.S.C. section [phone] 3(b)(1), unless the authorization is terminated or revoked sooner. Performed at [facility], [address] [place], [place] [address]   SARS Coronavirus 2 (Hosp order,Performed in [facility] lab via [employer] ID)     Status: None   Collection Time: 02/21/19 12:17 PM  Result Value Ref Range Status   SARS Coronavirus 2 ([employer]  ID Now) NEGATIVE NEGATIVE Final    Comment: (NOTE) Interpretive Result Comment(s): COVID 19 Positive SARS CoV 2 target nucleic acids are DETECTED. The SARS CoV 2 RNA is generally detectable in upper and lower respiratory specimens during the acute phase of infection.  Positive results are indicative of active infection with SARS CoV 2.  Clinical correlation with patient history and other diagnostic information is necessary to determine patient infection status.  Positive results do not rule out bacterial infection or coinfection with other viruses. The expected result is Negative. COVID 19 Negative SARS CoV 2 target nucleic acids are NOT DETECTED. The SARS CoV 2 RNA is generally  detectable in upper and lower respiratory specimens during the acute phase of infection.  Negative results do not preclude SARS CoV 2 infection, do not rule out coinfections with other pathogens, and should not be used as the sole basis for treatment or other patient management decisions.  Negative results must be combined with clinical  observations, patient history, and epidemiological information. The expected result is Negative. Invalid Presence or absence of SARS CoV 2 nucleic acids cannot be determined. Repeat testing was performed on the submitted specimen and repeated Invalid results were obtained.  If clinically indicated, additional testing on a new specimen with an alternate test methodology [phone]) is advised.  The SARS CoV 2 RNA is generally detectable in upper and lower respiratory specimens during the acute phase of infection. The expected result is Negative. Fact Sheet for Patients:  http://www.graves-ford.org/ Fact Sheet for Healthcare Providers: EnviroConcern.si This test is not yet approved or cleared by the [place] FDA and has been authorized for detection and/or diagnosis of SARS CoV 2 by FDA under an Emergency Use Authorization (EUA).   This EUA will remain in effect (meaning this test can be used) for the duration of the COVID19 d eclaration under Section 564(b)(1) of the Act, 21 U.S.C. section [phone] 3(b)(1), unless the authorization is terminated or revoked sooner. Performed at [facility], [address], [place], [place] [address]       Radiology Studies: No results found.  Breana Litts T. [facility] Pager [phone]  If 7PM-7AM, please contact night-coverage [email or web address] Password [email or web address] 02/24/2019, 7:36 AM

## 2019-02-24 NOTE — Plan of Care (Signed)
  Problem: Activity: Goal: Risk for activity intolerance will decrease Outcome: Progressing   Problem: Activity: Goal: Capacity to carry out activities will improve Outcome: Progressing   

## 2019-02-24 NOTE — Plan of Care (Signed)
  Problem: Activity: Goal: Risk for activity intolerance will decrease Outcome: Progressing   Problem: Education: Goal: Ability to demonstrate management of disease process will improve Outcome: Progressing Goal: Ability to verbalize understanding of medication therapies will improve Outcome: Progressing   Problem: Activity: Goal: Capacity to carry out activities will improve Outcome: Progressing   

## 2019-02-24 NOTE — Progress Notes (Signed)
Patients SBP on the 70's- low 90's, pt alert and oriented, verbalized he feels tired. MD on the unit came to see pt, and made orders.  Will monitor accordingly.

## 2019-02-24 NOTE — Plan of Care (Signed)
  Problem: Clinical Measurements: Goal: Will remain free from infection Outcome: Progressing Goal: Diagnostic test results will improve Outcome: Progressing   Problem: Activity: Goal: Risk for activity intolerance will decrease 02/24/2019 0531 by Carolanne Grumbling, RN Outcome: Progressing 02/24/2019 0529 by Carolanne Grumbling, RN Outcome: Progressing   Problem: Activity: Goal: Capacity to carry out activities will improve 02/24/2019 0531 by Carolanne Grumbling, RN Outcome: Progressing 02/24/2019 0529 by Carolanne Grumbling, RN Outcome: Progressing

## 2019-02-25 ENCOUNTER — Inpatient Hospital Stay (HOSPITAL_COMMUNITY): Payer: Self-pay

## 2019-02-25 DIAGNOSIS — I5021 Acute systolic (congestive) heart failure: Secondary | ICD-10-CM

## 2019-02-25 DIAGNOSIS — I5023 Acute on chronic systolic (congestive) heart failure: Secondary | ICD-10-CM

## 2019-02-25 LAB — BASIC METABOLIC PANEL
Anion gap: 10 (ref 5–15)
BUN: 19 mg/dL (ref 6–20)
CO2: 19 mmol/L — ABNORMAL LOW (ref 22–32)
Calcium: 8.7 mg/dL — ABNORMAL LOW (ref 8.9–10.3)
Chloride: 107 mmol/L (ref 98–111)
Creatinine, Ser: 1.28 mg/dL — ABNORMAL HIGH (ref 0.61–1.24)
GFR calc Af Amer: >60 mL/min (ref >60)
GFR calc non Af Amer: >60 mL/min (ref >60)
Glucose, Bld: 101 mg/dL — ABNORMAL HIGH (ref 70–99)
Potassium: 4.4 mmol/L (ref 3.5–5.1)
Sodium: 136 mmol/L (ref 135–145)

## 2019-02-25 LAB — MAGNESIUM: Magnesium: 2.1 mg/dL (ref 1.7–2.4)

## 2019-02-25 MED ORDER — ATORVASTATIN CALCIUM 40 MG PO TABS: 40.0000 mg | ORAL_TABLET | Freq: Every day | ORAL | 0 refills | Status: DC

## 2019-02-25 MED ORDER — FUROSEMIDE 20 MG PO TABS: 20.0000 mg | ORAL_TABLET | Freq: Every day | ORAL | 0 refills | Status: DC

## 2019-02-25 MED ORDER — SPIRONOLACTONE 12.5 MG HALF TABLET
12.5000 mg | ORAL_TABLET | Freq: Every day | ORAL | Status: DC
  Administered 2019-02-25: 10:00:00 12.5 mg via ORAL
  Filled 2019-02-25: qty 1

## 2019-02-25 MED ORDER — SACUBITRIL-VALSARTAN 24-26 MG PO TABS: 1.0000 | ORAL_TABLET | Freq: Two times a day (BID) | ORAL | 0 refills | Status: DC

## 2019-02-25 MED ORDER — GADOBUTROL 1 MMOL/ML IV SOLN
10.0000 mL | Freq: Once | INTRAVENOUS | Status: AC | PRN
  Administered 2019-02-25: 10 mL via INTRAVENOUS

## 2019-02-25 MED ORDER — SPIRONOLACTONE 25 MG PO TABS: 12.5000 mg | ORAL_TABLET | Freq: Every day | ORAL | 0 refills | Status: DC

## 2019-02-25 MED ORDER — ASPIRIN 81 MG PO TBEC: 81.0000 mg | DELAYED_RELEASE_TABLET | Freq: Every day | ORAL | 0 refills | Status: DC

## 2019-02-25 MED ORDER — CARVEDILOL 3.125 MG PO TABS: 3.1250 mg | ORAL_TABLET | Freq: Two times a day (BID) | ORAL | 0 refills | Status: DC

## 2019-02-25 MED FILL — SPIRONOLACTONE 25 MG TABLET: 25 | 30 days supply | Qty: 15 | Fill #0

## 2019-02-25 MED FILL — ASPIRIN LOW DOSE 81 MG TBEC: 81 | 30 days supply | Qty: 30 | Fill #0

## 2019-02-25 MED FILL — ATORVASTATIN CALCIUM 40 MG: 40 | 30 days supply | Qty: 30 | Fill #0

## 2019-02-25 MED FILL — FUROSEMIDE 20 MG TAB: 20 | 30 days supply | Qty: 30 | Fill #0

## 2019-02-25 MED FILL — CARVEDILOL 3.125 MG TABLET: 3.125 | 30 days supply | Qty: 60 | Fill #0

## 2019-02-25 MED FILL — ENTRESTO 24 MG-26 MG TABLET: 24-26 | 30 days supply | Qty: 60 | Fill #0

## 2019-02-25 NOTE — Progress Notes (Signed)
Patient ID: Nathaniel Carr., male   DOB: [birth year], 53 y.o.   MRN: [id number]     Advanced Heart Failure Rounding Note  PCP-Cardiologist: No primary care provider on file.   Subjective:    BP stable today, got meds.  Feels good, no dyspnea.   RHC/LHC (5/19):  Coronary Findings  Diagnostic  Dominance: Right  Left Main  No significant coronary disease.  Left Anterior Descending  No significant coronary disease. Slow flow.  Ramus Intermedius  Moderate vessel, no significant disease.  Left Circumflex  No significant coronary disease. Slow flow.  Right Coronary Artery  No significant coronary disease. Slow flow.  Intervention   No interventions have been documented.  Right Heart   Right Heart Pressures RHC Procedural Findings: Hemodynamics (mmHg) RA 4 RV 28/6 PA 29/17 mean 21 PCWP mean 10 LV 92/11 AO 97/78  Oxygen saturations: PA 73% AO 97%      Objective:   Weight Range: 87.3 kg Body mass index is 28.43 kg/m.   Vital Signs:   Temp:  [98.1 F (36.7 C)-98.4 F (36.9 C)] 98.1 F (36.7 C) (05/20 0434) Pulse Rate:  [98-105] 103 (05/20 0434) Resp:  [15-20] 20 (05/20 0434) BP: (76-120)/(53-98) 120/98 (05/20 0434) SpO2:  [98 %-100 %] 98 % (05/20 0434) Weight:  [87.3 kg] 87.3 kg (05/20 0433) Last BM Date: 02/25/19  Weight change: Filed Weights   02/23/19 0531 02/24/19 0538 02/25/19 0433  Weight: 86.2 kg 84.9 kg 87.3 kg    Intake/Output:   Intake/Output Summary (Last 24 hours) at 02/25/2019 1030 Last data filed at 02/25/2019 0944 Gross per 24 hour  Intake 686 ml  Output 650 ml  Net 36 ml      Physical Exam    General: NAD Neck: No JVD, no thyromegaly or thyroid nodule.  Lungs: Clear to auscultation bilaterally with normal respiratory effort. CV: Nondisplaced PMI.  Heart regular S1/S2, no S3/S4, no murmur.  No peripheral edema.   Abdomen: Soft, nontender, no hepatosplenomegaly, no distention.  Skin: Intact without lesions or rashes.   Neurologic: Alert and oriented x 3.  Psych: Normal affect. Extremities: No clubbing or cyanosis.  HEENT: Normal.   Telemetry   NSR 90s, personally reviewed  Labs    CBC Recent Labs    02/23/19 0740 02/24/19 0909  WBC 6.9  --   HGB 15.9 17.0  17.0  HCT 50.4 50.0  50.0  MCV 102.6*  --   PLT 246  --    Basic Metabolic Panel Recent Labs    59/56/38 0503 02/24/19 0909 02/25/19 0508  NA 138 138  138 136  K 3.9 3.8  3.9 4.4  CL 102  --  107  CO2 25  --  19*  GLUCOSE 107*  --  101*  BUN 17  --  19  CREATININE 1.26*  --  1.28*  CALCIUM 8.7*  --  8.7*  MG 2.3  --  2.1   Liver Function Tests Recent Labs    02/24/19 0503  AST 32  ALT 74*  ALKPHOS 54  BILITOT 2.0*  PROT 5.5*  ALBUMIN 3.1*   No results for input(s): LIPASE, AMYLASE in the last 72 hours. Cardiac Enzymes No results for input(s): CKTOTAL, CKMB, CKMBINDEX, TROPONINI in the last 72 hours.  BNP: BNP (last 3 results) Recent Labs    02/21/19 0938  BNP 956.1*    ProBNP (last 3 results) No results for input(s): PROBNP in the last 8760 hours.   D-Dimer No results  for input(s): DDIMER in the last 72 hours. Hemoglobin A1C Recent Labs    02/23/19 0740  HGBA1C 5.4   Fasting Lipid Panel Recent Labs    02/23/19 0740  CHOL 258*  HDL 58  LDLCALC 181*  TRIG 97  CHOLHDL 4.4   Thyroid Function Tests No results for input(s): TSH, T4TOTAL, T3FREE, THYROIDAB in the last 72 hours.  Invalid input(s): FREET3  Other results:   Imaging    No results found.   Medications:     Scheduled Medications: . aspirin EC  81 mg Oral Daily  . atorvastatin  40 mg Oral q1800  . carvedilol  3.125 mg Oral BID WC  . heparin  5,000 Units Subcutaneous Q8H  . loratadine  10 mg Oral Daily  . sacubitril-valsartan  1 tablet Oral BID  . sodium chloride flush  3 mL Intravenous Q12H  . sodium chloride flush  3 mL Intravenous Q12H  . sodium chloride flush  3 mL Intravenous Q12H  . spironolactone  12.5 mg  Oral Daily    Infusions: . sodium chloride    . sodium chloride      PRN Medications: sodium chloride, sodium chloride, acetaminophen, acetaminophen, ondansetron (ZOFRAN) IV, sodium chloride flush, sodium chloride flush    Assessment/Plan   1. Acute systolic CHF: Echo with EF 20-25%, diffuse hypokinesis, normal RV, moderate MR.  Cause uncertain.  Cath today showed no significant coronary disease.  Uncle has CHF, no other family members.  No ETOH/drugs.  No definite pre-existing URI/viral symptoms.  RHC yesterday showed optimized filling pressures and good cardiac output.   - Start Lasix 20 mg daily tomorrow.   - Continue Entresto 24/26 bid.  - Continue Coreg 3.125 mg bid.  - Can add spironolactone 12.5 daily.  - Cardiac MRI today to look for infiltrative disease/myocarditis.  2. ?CKD stage 3: Unsure of baseline.  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 Creatinine 1.28 today.  3. HTN: BP controlled with cardiac meds.  4. Hyperlipidemia: Very high LDL, started atorvastatin. 5. Disposition: Can go home today after cardiac MRI. Needs BMET in 1 week.  Followup CHF clinic 10 days in office.  Meds for discharge: Lasix 20 daily, Entresto 24/26 bid, Coreg 3.125 mg bid, spironolactone 12.5 daily, atorvastatin 40 daily.   Length of Stay: 4  Marca Ancona, MD  02/25/2019, 10:30 AM  Advanced Heart Failure Team Pager [phone] (M-F; 7a - 4p)  Please contact [facility] for night-coverage after hours (4p -7a ) and weekends on [email or web address]

## 2019-02-25 NOTE — Progress Notes (Signed)
Pt will be discharge after the Cardiac MRI as per chardiologist and Dr. Lowell Guitar, endorsed to in coming nurse.

## 2019-02-25 NOTE — Progress Notes (Signed)
Spoke with Dr. Lowell Guitar about Norvasc and he gave orders to D/C this at discharge and I gave these instructions to the patient. Discharge instructions complete including meds, diet, activity anf follow up appointments. CHF and TR band teaching complete as well. All questions answered. Copy of instructions given to patient and meds provided by [facility] pharmacy.

## 2019-02-25 NOTE — TOC Transition Note (Signed)
Transition of Care [facility]) - CM/SW Discharge Note   Patient Details  Name: Nathaniel Carr. MRN: [id number] Date of Birth: Nov 17, 1965  [facility] CM/SW Contact:  Reola Mosher Phone Number: [phone] 02/25/2019, 2:32 PM   Clinical Narrative:    Patient is for possible discharge home today after MRI; pt to follow up with the [facility]; medication will be given to patient through the [facility] pharmacy, medication will be delivered to his room prior to dc home.   Final next level of care: Home/Self Care Barriers to Discharge: No Barriers Identified   Patient Goals and CMS Choice     Choice offered to / list presented to : NA  Discharge Placement                       Discharge Plan and Services In-house Referral: Financial Counselor Discharge Planning Services: NA Post Acute Care Choice: NA          DME Arranged: N/A DME Agency: NA       HH Arranged: NA HH Agency: NA        Social Determinants of Health (SDOH) Interventions     Readmission Risk Interventions No flowsheet data found.

## 2019-02-25 NOTE — Progress Notes (Signed)
Pt going down for Cardiac MRI via wheelchair with transporter.

## 2019-02-25 NOTE — Discharge Summary (Signed)
Physician Discharge Summary  Nathaniel Carr. ZOX:[id number] DOB: September 12, 1966 DOA: 02/21/2019  PCP: Nathaniel Carr  Admit date: 02/21/2019 Discharge date: 02/25/2019  Time spent: 40 minutes  Recommendations for Outpatient Follow-up:  1. Follow up outpatient CBC/CMP 2. Follow up with cards outpatient 3. Follow BP and adjust meds as able   4. Follow up MRI results with cardiology  Discharge Diagnoses:  Principal Problem:   Acute exacerbation of CHF (congestive heart failure) (HCC) Active Problems:   Essential hypertension   AKI (acute kidney injury) (HCC)   Elevated troponin   Dyslipidemia   Acute systolic CHF (congestive heart failure) (HCC)   Discharge Condition: stable  Diet recommendation: heart healthy  Filed Weights   02/23/19 0531 02/24/19 0538 02/25/19 0433  Weight: 86.2 kg 84.9 kg 87.3 kg    History of present illness:  53 year old male with history of hypertension, hyperlipidemia and anemia presenting with leg swelling and shortness of breath for 1 to 2 weeks and admitted for possible acute CHF.  He has no prior diagnosis of CHF. Denies NSAID use, alcohol or dietary indiscretion.  Recently ran out of his amlodipine.  He does not have PCP.  Sleeps with 2 pillow which is unchanged from baseline.  No chest pain.  Not Nathaniel Carr smoker or drinker.  Denies recreational drug use.  In ED, slightly tachycardic and hypertensive.  Good saturation on room air.  Creatinine 1.47 (unknown baseline but 1.2 in 2015).  Nathaniel Carr (unknown baseline).  Troponin 0 0.033.  D-dimer elevated to 1.07.  Lower extremity venous Doppler negative for DVT.  CTA chest negative for PE but Nathaniel Carr small pleural effusion and mild interstitial edema. COVID-19 test negative.  Started on IV Lasix 40 mg and hospitalist service was consulted for admission.  Patient was admitted for acute CHF and started on IV Lasix 40 mg twice daily.  Echocardiogram Echocardiogram on 02/22/2019 with EF of 20 to 25% and diffuse  hypokinesis, severe LAE but no other significant structural or functional abnormalities.  LHC did not reveal significant CAD.  RHC showed optimal filling pressures and good cardiac output.  Pt now transitioned to PO medications.  Had cardiac MRI today prior to discharge.  Hospital Course:  New acute systolic CHF: has had cardinal symptoms and exam finding for CHF exacerbation.  BNP elevated to 1000 and CXR and CTA chest suggestive for CHF.  Echocardiogram on 02/22/2019 with EF of 20 to 25% and diffuse hypokinesis, severe LAE but no other significant structural or functional abnormalities.  LHC and RHC not impressive.  TSH within normal range.  No recent illness. Started on IV Lasix 40 mg twice daily with excellent urine output.  Net -8 L so far.  Weight 13 pounds down.  Renal function improved. -Resume lasix tomorrow  -Digoxin discontinued by cardiology. -Continue Entresto, Coreg, ASA and statin. -Cardiac MRI today 5/20, follow up with cards outpatient -Daily weight, intake output and renal function -Replenish electrolytes aggressively -Case management for PCP - Follow with cards outpatient  Essential hypertension: Normotensive. -Cardiac meds as above. -Appreciate cardiology input.  Elevated d-dimer: DVT and PE excluded by venous Doppler and CTA chest.  Elevated creatinine likely CKD 3: 1.47 on admission (unknown baseline but 1.2 in 2015).  Improved. -Continue monitoring  Procedures: Echo IMPRESSIONS    1. The left ventricle has severely reduced systolic function, with an ejection fraction of 20-25%. The cavity size was severely dilated. Indeterminate diastolic filling due to Nathaniel Carr. Elevated left atrial and left ventricular end-diastolic pressures  Left ventricular diffuse hypokinesis.  2. The right ventricle has normal systolic function. The cavity was normal. There is no increase in right ventricular wall thickness.  3. Left atrial size was severely dilated.  4. Right  atrial size was mildly dilated.  5. Mitral valve regurgitation is moderate by color flow Doppler.  6. Tricuspid valve regurgitation is moderate.  7. The aortic valve is tricuspid. Mild sclerosis of the aortic valve. Aortic valve regurgitation is trivial by color flow Doppler.   IMPRESSION: No evidence of deep venous thrombosis in the visualized bilateral lower extremities.  Subcutaneous edema in the calves bilaterally.  Right/LHC 1. Optimized filling pressures.  2. Preserved cardiac output.  3. No significant coronary disease.  Nonischemic cardiomyopathy.   Consultations:  cardiology  Discharge Exam: Vitals:   02/25/19 0434 02/25/19 1139  BP: (!) 120/98 110/79  Pulse: (!) 103 97  Resp: 20 18  Temp: 98.1 F (36.7 C) 98.3 F (36.8 C)  SpO2: 98% 100%   Feels ready for d/c.  General: No acute distress. Cardiovascular: Heart sounds show Nathaniel Carr regular rate, and rhythm. Lungs: Clear to auscultation bilaterally Abdomen: Soft, nontender, nondistended  Neurological: Alert and oriented 3. Moves all extremities 4. Cranial nerves II through XII grossly intact. Skin: Warm and dry. No rashes or lesions. Extremities: No clubbing or cyanosis. No edema.  Psychiatric: Mood and affect are normal. Insight and judgment are appropriate.  Discharge Instructions   Discharge Instructions    (HEART FAILURE PATIENTS) Call MD:  Anytime you have any of the following symptoms: 1) 3 pound weight gain in 24 hours or 5 pounds in 1 week 2) shortness of breath, with or without Nathaniel Carr 3) swelling in the hands, feet or stomach 4) if you have to sleep on extra pillows at night in order to breathe.   Complete by:  As directed    Call MD for:  difficulty breathing, headache or visual disturbances   Complete by:  As directed    Call MD for:  extreme fatigue   Complete by:  As directed    Call MD for:  hives   Complete by:  As directed    Call MD for:  persistant dizziness or light-headedness    Complete by:  As directed    Call MD for:  persistant nausea and vomiting   Complete by:  As directed    Call MD for:  redness, tenderness, or signs of infection (pain, swelling, redness, odor or green/yellow discharge around incision site)   Complete by:  As directed    Call MD for:  severe uncontrolled pain   Complete by:  As directed    Call MD for:  temperature >100.4   Complete by:  As directed    Diet - low sodium heart healthy   Complete by:  As directed    Discharge instructions   Complete by:  As directed    You were seen for Shiquita Collignon heart failure exacerbation.  You've improved with new medications and diuresis (lasix).    We've started you on new medications for your heart failure (entresto, coreg, lasix, spironolactone, and lipitor).  You have Evelio Rueda cardiac MRI which is pending at the time of discharge.  Please follow up the results with cardiology.  Return for new, recurrent, or worsening symptoms.  Please ask your PCP to request records from this hospitalization so they know what was done and what the next steps will be.   Increase activity slowly   Complete  by:  As directed      Allergies as of 02/25/2019      Reactions   Shellfish-derived Products Shortness Of Breath, Nausea And Vomiting   Can tolerate grilled shrimp   Codeine Nausea Only   Grass Extracts [gramineae Pollens] Nausea Only   Penicillins Nausea Only   Has patient had Akil Hoos PCN reaction causing immediate rash, facial/tongue/throat swelling, SOB or lightheadedness with hypotension: No Has patient had Allizon Woznick PCN reaction causing severe rash involving mucus membranes or skin necrosis: No Has patient had Kamaryn Grimley PCN reaction that required hospitalization: Unk Has patient had Yassmin Binegar PCN reaction occurring within the last 10 years: From childhood If all of the above answers are "NO", then may proc      Medication List    TAKE these medications   amLODipine 10 MG tablet Commonly known as:  NORVASC Take 10 mg by mouth daily.    aspirin 81 MG EC tablet Take 1 tablet (81 mg total) by mouth daily for 30 days. Start taking on:  Feb 26, 2019   aspirin-acetaminophen-caffeine 250-250-65 MG tablet Commonly known as:  EXCEDRIN MIGRAINE Take 2 tablets by mouth every 6 (six) hours as needed for headache or migraine.   atorvastatin 40 MG tablet Commonly known as:  LIPITOR Take 1 tablet (40 mg total) by mouth daily at 6 PM for 30 days.   carvedilol 3.125 MG tablet Commonly known as:  COREG Take 1 tablet (3.125 mg total) by mouth 2 (two) times daily with Parissa Chiao meal for 30 days.   furosemide 20 MG tablet Commonly known as:  Lasix Take 1 tablet (20 mg total) by mouth daily for 30 days. Start taking on:  Feb 26, 2019   sacubitril-valsartan 24-26 MG Commonly known as:  ENTRESTO Take 1 tablet by mouth 2 (two) times daily for 30 days.   spironolactone 25 MG tablet Commonly known as:  ALDACTONE Take 0.5 tablets (12.5 mg total) by mouth daily for 30 days. Start taking on:  Feb 26, 2019   VITAMIN B-COMPLEX PO Take 1 tablet by mouth daily.   VITAMIN C PO Take 1 tablet by mouth daily.   VITAMIN D-3 PO Take 1 capsule by mouth 2 (two) times daily after Fabrizio Filip meal.      Allergies  Allergen Reactions  . Shellfish-Derived Products Shortness Of Breath and Nausea And Vomiting    Can tolerate grilled shrimp  . Codeine Nausea Only  . Grass Extracts [Gramineae Pollens] Nausea Only  . Penicillins Nausea Only    Has patient had Esteven Overfelt PCN reaction causing immediate rash, facial/tongue/throat swelling, SOB or lightheadedness with hypotension: No Has patient had Dejay Kronk PCN reaction causing severe rash involving mucus membranes or skin necrosis: No Has patient had Sheffield Hawker PCN reaction that required hospitalization: Unk Has patient had Jaydan Chretien PCN reaction occurring within the last 10 years: From childhood If all of the above answers are "NO", then may proc   Follow-up Information    Clegg, Amy D, NP Follow up on 03/09/2019.   Specialty:   Cardiology Why:  3 PM. Hospital follow up in [facility]. Use [address]. Turn right into parking garage. Code for June is 8007. Go to first floor to check in. Bring your medications with you.  Contact information: [address] [place] [address] [phone]            The results of significant diagnostics from this hospitalization (including imaging, microbiology, ancillary and laboratory) are listed below for reference.  Significant Diagnostic Studies: Ct Angio Chest Pe W And/or Wo Contrast  Result Date: 02/21/2019 CLINICAL DATA:  Carr, chest pain, shortness of breath, elevated D-dimer. EXAM: CT ANGIOGRAPHY CHEST WITH CONTRAST TECHNIQUE: Multidetector CT imaging of the chest was performed using the standard protocol during bolus administration of intravenous contrast. Multiplanar CT image reconstructions and MIPs were obtained to evaluate the vascular anatomy. CONTRAST:  OMNIPAQUE IOHEXOL 350 MG/ML SOLN COMPARISON:  None. FINDINGS: Cardiovascular: Satisfactory opacification of the bilateral pulmonary arteries to the segmental level. No evidence of pulmonary embolism. No evidence of thoracic aortic aneurysm. The heart is normal in size.  No pericardial effusion. Mediastinum/Nodes: No suspicious mediastinal lymphadenopathy. Visualized thyroid is unremarkable. Lungs/Pleura: Mild paraseptal emphysematous changes at the lung apices. Mild mosaic attenuation/ground-glass opacities in the lungs bilaterally, possibly reflecting mild interstitial edema, less likely air trapping. Small bilateral pleural effusions. No suspicious pulmonary nodules. No pneumothorax. Upper Abdomen: Visualized upper abdomen is grossly unremarkable. Musculoskeletal: Visualized osseous structures are within normal limits. Review of the MIP images confirms the above findings. IMPRESSION: No evidence of pulmonary embolism. Small bilateral pleural effusions. Possible mild interstitial edema,  less likely air trapping related to small airways disease. Electronically Signed   By: Charline Bills M.D.   On: 02/21/2019 11:32   US Venous Img Lower Bilateral  Result Date: 02/21/2019 CLINICAL DATA:  Shortness of breath x2 weeks, bilateral lower leg swelling x6 days EXAM: BILATERAL LOWER EXTREMITY VENOUS DOPPLER ULTRASOUND TECHNIQUE: Gray-scale sonography with graded compression, as well as color Doppler and duplex ultrasound were performed to evaluate the lower extremity deep venous systems from the level of the common femoral vein and including the common femoral, femoral, profunda femoral, popliteal and calf veins including the posterior tibial, peroneal and gastrocnemius veins when visible. The superficial great saphenous vein was also interrogated. Spectral Doppler was utilized to evaluate flow at rest and with distal augmentation maneuvers in the common femoral, femoral and popliteal veins. COMPARISON:  None. FINDINGS: RIGHT LOWER EXTREMITY Common Femoral Vein: No evidence of thrombus. Normal compressibility, respiratory phasicity and response to augmentation. Saphenofemoral Junction: No evidence of thrombus. Normal compressibility and flow on color Doppler imaging. Profunda Femoral Vein: No evidence of thrombus. Normal compressibility and flow on color Doppler imaging. Femoral Vein: No evidence of thrombus. Normal compressibility, respiratory phasicity and response to augmentation. Popliteal Vein: No evidence of thrombus. Normal compressibility, respiratory phasicity and response to augmentation. Calf Veins: No evidence of thrombus. Normal compressibility and flow on color Doppler imaging. Superficial Great Saphenous Vein: No evidence of thrombus. Normal compressibility. Venous Reflux:  None. Other Findings:  Subcutaneous edema in the calf. LEFT LOWER EXTREMITY Common Femoral Vein: No evidence of thrombus. Normal compressibility, respiratory phasicity and response to augmentation. Saphenofemoral  Junction: No evidence of thrombus. Normal compressibility and flow on color Doppler imaging. Profunda Femoral Vein: No evidence of thrombus. Normal compressibility and flow on color Doppler imaging. Femoral Vein: No evidence of thrombus. Normal compressibility, respiratory phasicity and response to augmentation. Popliteal Vein: No evidence of thrombus. Normal compressibility, respiratory phasicity and response to augmentation. Calf Veins: No evidence of thrombus. Normal compressibility and flow on color Doppler imaging. Superficial Great Saphenous Vein: No evidence of thrombus. Normal compressibility. Venous Reflux:  None. Other Findings:  Subcutaneous edema in the calf. IMPRESSION: No evidence of deep venous thrombosis in the visualized bilateral lower extremities. Subcutaneous edema in the calves bilaterally. Electronically Signed   By: Charline Bills M.D.   On: 02/21/2019 11:24   Mr Cardiac Morphology W Ilda Basset  Contrast  Result Date: 02/25/2019 CLINICAL DATA:  Cardiomyopathy of uncertain etiology EXAM: CARDIAC MRI TECHNIQUE: The patient was scanned on Jamale Spangler 1.5 Tesla GE magnet. Kamoni Depree dedicated cardiac coil was used. Functional imaging was done using Fiesta sequences. 2,3, and 4 chamber views were done to assess for RWMA's. Modified Simpson's rule using Georgeanne Frankland short axis stack was used to calculate an ejection fraction on Edia Pursifull dedicated work [employer]. The patient received 8 cc of Gadavist. After 10 minutes inversion recovery sequences were used to assess for infiltration and scar tissue. FINDINGS: Limited images of the lung fields showed no gross abnormalities. There was Laryssa Hassing small pericardial effusion. Moderate left ventricular dilation with normal wall thickness, EF 17% with diffuse severe hypokinesis. No LV thrombus. Normal right ventricular size and systolic function, EF 46%. Mild biatrial enlargement. Trileaflet aortic valve with no stenosis or regurgitation. Probably moderate mitral regurgitation.  Moderate tricuspid regurgitation. Delayed enhancement images were very poor, to the point of being uninterpretable. Measurements: LVEDV 295 mL LVSV 50 mL LVEF 17% RVEDV 172 mL RVSV 80 mL RVEF 46% IMPRESSION: 1.  Moderate LV dilation with diffuse, severe hypokinesis, EF 17%. 2.  Normal RV size and systolic function, EF 46%. 3.  Moderate MR and moderate TR. 4.  Delayed enhancement images were uninterpretable. Dalton Mclean Electronically Signed   By: Marca Ancona M.D.   On: 02/25/2019 14:43    Microbiology: Recent Results (from the past 240 hour(s))  SARS Coronavirus 2 (Hosp order,Performed in [facility] lab via [employer] ID)     Status: Abnormal   Collection Time: 02/21/19 11:15 AM  Result Value Ref Range Status   SARS Coronavirus 2 (Abbott ID Now) (Jaeveon Ash[place]) NEGATIVE Final    INVALID, UNABLE TO DETERMINE THE PRESENCE OF TARGET DNA DUE TO SPECIMEN INTEGRITY. RECOLLECTION REQUESTED.    Comment: (NOTE) Interpretive Result Comment(s): COVID 19 Positive SARS CoV 2 target nucleic acids are DETECTED. The SARS CoV 2 RNA is generally detectable in upper and lower respiratory specimens during the acute phase of infection.  Positive results are indicative of active infection with SARS CoV 2.  Clinical correlation with patient history and other diagnostic information is necessary to determine patient infection status.  Positive results do not rule out bacterial infection or coinfection with other viruses. The expected result is Negative. COVID 19 Negative SARS CoV 2 target nucleic acids are NOT DETECTED. The SARS CoV 2 RNA is generally detectable in upper and lower respiratory specimens during the acute phase of infection.  Negative results do not preclude SARS CoV 2 infection, do not rule out coinfections with other pathogens, and should not be used as the sole basis for treatment or other patient management decisions.  Negative results must be combined with clinical  observations, patient history, and  epidemiological information. The expected result is Negative. Invalid Presence or absence of SARS CoV 2 nucleic acids cannot be determined. Repeat testing was performed on the submitted specimen and repeated Invalid results were obtained.  If clinically indicated, additional testing on Donley Harland new specimen with an alternate test methodology [phone]) is advised.  The SARS CoV 2 RNA is generally detectable in upper and lower respiratory specimens during the acute phase of infection. The expected result is Negative. Fact Sheet for Patients:  http://www.graves-ford.org/ Fact Sheet for Healthcare Providers: EnviroConcern.si This test is not yet approved or cleared by the [place] FDA and has been authorized for detection and/or diagnosis of SARS CoV 2 by FDA under an Emergency Use Authorization (EUA).  This EUA will remain in effect (meaning this test can be used) for the duration of the COVID19 d eclaration under Section 564(b)(1) of the Act, 21 U.S.C. section [phone] [id number] 3(b)(1), unless the authorization is terminated or revoked sooner. Performed at [facility] [place], [address] [place], [place] [address]   SARS Coronavirus 2 (Hosp order,[facility] lab via [employer] ID)     Status: None   Collection Time: 02/21/19 12:17 PM  Result Value Ref Range Status   SARS Coronavirus 2 (Abbott ID Now) NEGATIVE NEGATIVE Final    Comment: (NOTE) Interpretive Result Comment(s): COVID 19 Positive SARS CoV 2 target nucleic acids are DETECTED. The SARS CoV 2 RNA is generally detectable in upper and lower respiratory specimens during the acute phase of infection.  Positive results are indicative of active infection with SARS CoV 2.  Clinical correlation with patient history and other diagnostic information is necessary to determine patient infection status.  Positive results do not rule out bacterial infection or coinfection with other  viruses. The expected result is Negative. COVID 19 Negative SARS CoV 2 target nucleic acids are NOT DETECTED. The SARS CoV 2 RNA is generally detectable in upper and lower respiratory specimens during the acute phase of infection.  Negative results do not preclude SARS CoV 2 infection, do not rule out coinfections with other pathogens, and should not be used as the sole basis for treatment or other patient management decisions.  Negative results must be combined with clinical  observations, patient history, and epidemiological information. The expected result is Negative. Invalid Presence or absence of SARS CoV 2 nucleic acids cannot be determined. Repeat testing was performed on the submitted specimen and repeated Invalid results were obtained.  If clinically indicated, additional testing on Nyashia Raney new specimen with an alternate test methodology [id number]) is advised.  The SARS CoV 2 RNA is generally detectable in upper and lower respiratory specimens during the acute phase of infection. The expected result is Negative. Fact Sheet for Patients:  http://www.graves-ford.org/https://www.fda.gov/media/136524/download Fact Sheet for Healthcare Providers: EnviroConcern.sihttps://www.fda.gov/media/136523/download This test is not yet approved or cleared by the [place] FDA and has been authorized for detection and/or diagnosis of SARS CoV 2 by FDA under an Emergency Use Authorization (EUA).  This EUA will remain in effect (meaning this test can be used) for the duration of the COVID19 d eclaration under Section 564(b)(1) of the Act, 21 U.S.C. section [id number] 3(b)(1), unless the authorization is terminated or revoked sooner. Performed at [facility] [place], [address] [place], [place] [address]      Labs: Basic Metabolic Panel: Recent Labs  Lab 02/22/19 0456 02/23/19 0740 02/23/19 0956 02/24/19 0503 02/24/19 0909 02/25/19 0508  NA 143 141 142 138 138  138 136  K 3.6 3.8 3.5 3.9 3.8  3.9 4.4  CL 106 102  103 102  --  107  CO2 22 26 28 25   --  19*  GLUCOSE 104* 89 141* 107*  --  101*  BUN 19 20 20 17   --  19  CREATININE 1.54* 1.48* 1.54* 1.26*  --  1.28*  CALCIUM 9.3 9.1 9.6 8.7*  --  8.7*  MG 2.2  --  2.4 2.3  --  2.1   Liver Function Tests: Recent Labs  Lab 02/21/19 0938 02/24/19 0503  AST 60* 32  ALT 117* 74*  ALKPHOS 59 54  BILITOT 2.4* 2.0*  PROT 6.1* 5.5*  ALBUMIN 3.6 3.1*   No results  for input(s): LIPASE, AMYLASE in the last 168 hours. No results for input(s): AMMONIA in the last 168 hours. CBC: Recent Labs  Lab 02/21/19 0938 02/23/19 0740 02/24/19 0909  WBC 6.6 6.9  --   NEUTROABS 5.2  --   --   HGB 13.9 15.9 17.0  17.0  HCT 44.4 50.4 50.0  50.0  MCV 103.0* 102.6*  --   PLT 223 246  --    Cardiac Enzymes: Recent Labs  Lab 02/21/19 0938 02/21/19 1617 02/21/19 2226 02/22/19 0456  TROPONINI 0.03* <0.03 0.03* 0.03*   BNP: BNP (last 3 results) Recent Labs    02/21/19 0938  BNP Nathaniel Carr.1*    ProBNP (last 3 results) No results for input(s): PROBNP in the last 8760 hours.  CBG: No results for input(s): GLUCAP in the last 168 hours.     Signed:  Lacretia Nicks MD.  [facility] 02/25/2019, 3:15 PM

## 2019-03-05 ENCOUNTER — Other Ambulatory Visit: Payer: Self-pay

## 2019-03-05 ENCOUNTER — Ambulatory Visit: Payer: Self-pay | Attending: Primary Care | Admitting: Primary Care

## 2019-03-05 ENCOUNTER — Encounter: Payer: Self-pay | Admitting: Primary Care

## 2019-03-05 DIAGNOSIS — I5021 Acute systolic (congestive) heart failure: Secondary | ICD-10-CM

## 2019-03-05 DIAGNOSIS — Z7689 Persons encountering health services in other specified circumstances: Secondary | ICD-10-CM

## 2019-03-05 DIAGNOSIS — Z09 Encounter for follow-up examination after completed treatment for conditions other than malignant neoplasm: Secondary | ICD-10-CM

## 2019-03-05 DIAGNOSIS — I10 Essential (primary) hypertension: Secondary | ICD-10-CM

## 2019-03-05 DIAGNOSIS — E785 Hyperlipidemia, unspecified: Secondary | ICD-10-CM

## 2019-03-05 NOTE — Progress Notes (Signed)
Virtual Visit via Telephone Note  I connected with Nathaniel Carr. on 03/05/19 at 11:15  by telephone and verified that I am speaking with the correct person using two identifiers.   I discussed the limitations, risks, security and privacy concerns of performing an evaluation and management service by telephone and the availability of in person appointments. I also discussed with the patient that there may be a patient responsible charge related to this service. The patient expressed understanding and agreed to proceed.   History of Present Illness: [name]. was tele visit to establish care and hospital f/u . Review of encounters last seen by PCP 02/26/2014. Past medical history significant of HTN, HLD, and anemia.; who presents with complaints of leg swelling and shortness of breath.  Reports for the last 2 weeks he is been short of breath with exertion, and over the last week he started developing lower extremity swelling    Observations/Objective: Review of Systems  Constitutional: Negative.   HENT: Negative.   Eyes: Negative.   Respiratory: Positive for cough.   Cardiovascular: Negative.   Gastrointestinal: Negative.   Genitourinary: Negative.   Musculoskeletal: Negative.   Skin: Negative.        Arm left IV site red  Endo/Heme/Allergies: Negative.   Psychiatric/Behavioral: Negative.     Assessment and Plan: Nathaniel Carr was seen today for hospitalization follow-up.  Diagnoses and all orders for this visit:  Encounter to establish care Last seen by a provider  except via ED was 2015.   Dyslipidemia Labs from hospitalization 02/23/2019 indicated cholesterol 258 HDL 58 triglycerides 97 LDL calculations 181 was discharged on atorvastatin 40 mg.  At this visit we discussed low fat diet reduction in fried foods, organ meats, eggs, and cheese.  Essential Hypertension Reviewed hospitalizations records on blood pressure ranged from 138/116 discharged on amlodipine,  carvedilol Patient will be followed by heart failure clinic appointment scheduled with Ms. Tonye Becket nurse practitioner on March 09, 2019.  Acute systolic CHF (congestive heart failure) (HCC) Patient presented to ED with shortness of breath and bilateral lower extremity edema.  CT angiogram of the chest showed pulmonary edema and acute congestive heart failure.   Hosptital discharge follow-up Hospital discharge notes indicated following up with cardiology and establish care for PCP with [facility].   Follow Up Instructions:    I discussed the assessment and treatment plan with the patient. The patient was provided an opportunity to ask questions and all were answered. The patient agreed with the plan and demonstrated an understanding of the instructions.   The patient was advised to call back or seek an in-person evaluation if the symptoms worsen or if the condition fails to improve as anticipated.  I provided 30 minutes of non-face-to-face time during this encounter.  This includes reviewing past medical history encounters ED visits hospital admission and discharge notes and educating patient.   Grayce Sessions, NP

## 2019-03-05 NOTE — Progress Notes (Signed)
Patient verified DOB Patient has taken medication today. Patient has eaten today. Patient denies pain at this time. 

## 2019-03-09 ENCOUNTER — Encounter (HOSPITAL_COMMUNITY): Payer: Self-pay

## 2019-03-09 ENCOUNTER — Ambulatory Visit (HOSPITAL_COMMUNITY)
Admission: RE | Admit: 2019-03-09 | Discharge: 2019-03-09 | Disposition: A | Discharge status: Home / Self Care | Payer: Self-pay | Source: Ambulatory Visit | Attending: Internal Medicine | Admitting: Internal Medicine

## 2019-03-09 ENCOUNTER — Other Ambulatory Visit: Payer: Self-pay

## 2019-03-09 VITALS — BP 128/108 | HR 106 | Wt 201.0 lb

## 2019-03-09 DIAGNOSIS — I5022 Chronic systolic (congestive) heart failure: Secondary | ICD-10-CM | POA: Insufficient documentation

## 2019-03-09 DIAGNOSIS — N183 Chronic kidney disease, stage 3 unspecified: Secondary | ICD-10-CM

## 2019-03-09 DIAGNOSIS — E785 Hyperlipidemia, unspecified: Secondary | ICD-10-CM | POA: Insufficient documentation

## 2019-03-09 DIAGNOSIS — I10 Essential (primary) hypertension: Secondary | ICD-10-CM

## 2019-03-09 DIAGNOSIS — Z8249 Family history of ischemic heart disease and other diseases of the circulatory system: Secondary | ICD-10-CM | POA: Insufficient documentation

## 2019-03-09 DIAGNOSIS — Z7982 Long term (current) use of aspirin: Secondary | ICD-10-CM | POA: Insufficient documentation

## 2019-03-09 DIAGNOSIS — D649 Anemia, unspecified: Secondary | ICD-10-CM | POA: Insufficient documentation

## 2019-03-09 DIAGNOSIS — Z79899 Other long term (current) drug therapy: Secondary | ICD-10-CM | POA: Insufficient documentation

## 2019-03-09 DIAGNOSIS — I130 Hypertensive heart and chronic kidney disease with heart failure and stage 1 through stage 4 chronic kidney disease, or unspecified chronic kidney disease: Secondary | ICD-10-CM | POA: Insufficient documentation

## 2019-03-09 LAB — BASIC METABOLIC PANEL
Anion gap: 8 (ref 5–15)
BUN: 21 mg/dL — ABNORMAL HIGH (ref 6–20)
CO2: 22 mmol/L (ref 22–32)
Calcium: 9 mg/dL (ref 8.9–10.3)
Chloride: 111 mmol/L (ref 98–111)
Creatinine, Ser: 1.44 mg/dL — ABNORMAL HIGH (ref 0.61–1.24)
GFR calc Af Amer: >60 mL/min (ref >60)
GFR calc non Af Amer: 56 mL/min — ABNORMAL LOW (ref >60)
Glucose, Bld: 108 mg/dL — ABNORMAL HIGH (ref 70–99)
Potassium: 4 mmol/L (ref 3.5–5.1)
Sodium: 141 mmol/L (ref 135–145)

## 2019-03-09 MED ORDER — SACUBITRIL-VALSARTAN 49-51 MG PO TABS: 1.0000 | ORAL_TABLET | Freq: Two times a day (BID) | ORAL | 3 refills | Status: DC

## 2019-03-09 MED ORDER — FUROSEMIDE 40 MG PO TABS: 40.0000 mg | ORAL_TABLET | Freq: Every day | ORAL | 3 refills | Status: DC

## 2019-03-09 MED FILL — FUROSEMIDE 40 MG TAB: 40 | 30 days supply | Qty: 30 | Fill #0

## 2019-03-09 MED FILL — FUROSEMIDE 40 MG TAB: 40 | 90 days supply | Qty: 90 | Fill #0 | Status: TO

## 2019-03-09 NOTE — Progress Notes (Signed)
PCP: None  Primary Cardiologist: Dr Shirlee Latch   HPI: Nathaniel Carr. is a 53 y.o. male with a history of HTN, HLD, and anemia. No known cardiac history. He had an Uncle with heart failure.   Admitted 5/16/20202 with increased dyspnea and cough. CTA negative for PE, COVID-19 negative. Diuresed with IV lasix. Echo completed showed newly reduced EF 20-25%, so advanced HF team was consulted. He underwent RHC/LHC that showed preserved cardiac output. No cad, and optimized filling pressures. Hf meds started. Discharge weight 192.5 pounds.   Today he returns for post hospital follow up. Overall feeling fine. Says he has some dyspnea over the weekend. Denies PND/Orthopnea. Appetite ok. No fever or chills. Weight at home 196--->201 pounds. Taking all medications. Lives with his wife.. 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    ECHO 02/22/19  EF 20-25% RV normal.   RHC./LHC 02/24/2019  RA 4 RV 28/6 PA 29/17 mean 21 PCWP mean 10 LV 92/11 AO 97/78 Oxygen saturations: PA 73% AO 97% Cardiac Output (Fick) 5.89  Cardiac Index (Fick) 2.93  CMRI 02/25/2019  1. Moderate LV dilation with diffuse, severe hypokinesis, EF 17%. 2.  Normal RV size and systolic function, EF 46%. 3.  Moderate MR and moderate TR. ROS: All systems negative except as listed in HPI, PMH and Problem List.  SH:  Social History   Socioeconomic History  . Marital status: Married    Spouse name: Not on file  . Number of children: Not on file  . Years of education: Not on file  . Highest education level: Not on file  Occupational History  . Not on file  Social Needs  . Financial resource strain: Not on file  . Food insecurity:    Worry: Not on file    Inability: Not on file  . Transportation needs:    Medical: Not on file    Non-medical: Not on file  Tobacco Use  . Smoking status: Never Smoker  . Smokeless tobacco: Never Used  . Tobacco comment: Married, lives with spouse. Works as Equities trader  Substance and Sexual Activity  . Alcohol use: No  . Drug use: No  . Sexual activity: Yes  Lifestyle  . Physical activity:    Days per week: Not on file    Minutes per session: Not on file  . Stress: Not on file  Relationships  . Social connections:    Talks on phone: Not on file    Gets together: Not on file    Attends religious service: Not on file    Active member of club or organization: Not on file    Attends meetings of clubs or organizations: Not on file    Relationship status: Not on file  . Intimate partner violence:    Fear of current or ex partner: Not on file    Emotionally abused: Not on file    Physically abused: Not on file    Forced sexual activity: Not on file  Other Topics Concern  . Not on file  Social History Narrative  . Not on file    FH:  Family History  Problem Relation Age of Onset  . Hypertension Paternal Grandfather     Past Medical History:  Diagnosis Date  . ANEMIA-NOS 12/05/2009  . DYSLIPIDEMIA 01/09/2010  . Headache(784.0) 12/05/2009  . Hemorrhoids   . HYPERTENSION 12/05/2009    Current Outpatient Medications  Medication Sig Dispense Refill  . aspirin EC 81 MG EC tablet  Take 1 tablet (81 mg total) by mouth daily for 30 days. 30 tablet 0  . atorvastatin (LIPITOR) 40 MG tablet Take 1 tablet (40 mg total) by mouth daily at 6 PM for 30 days. 30 tablet 0  . carvedilol (COREG) 3.125 MG tablet Take 1 tablet (3.125 mg total) by mouth 2 (two) times daily with a meal for 30 days. 60 tablet 0  . furosemide (LASIX) 20 MG tablet Take 1 tablet (20 mg total) by mouth daily for 30 days. 30 tablet 0  . sacubitril-valsartan (ENTRESTO) 24-26 MG Take 1 tablet by mouth 2 (two) times daily for 30 days. 60 tablet 0  . spironolactone (ALDACTONE) 25 MG tablet Take 0.5 tablets (12.5 mg total) by mouth daily for 30 days. 15 tablet 0  . Ascorbic Acid (VITAMIN C PO) Take 1 tablet by mouth daily.    Marland Kitchen aspirin-acetaminophen-caffeine (EXCEDRIN MIGRAINE)  250-250-65 MG per tablet Take 2 tablets by mouth every 6 (six) hours as needed for headache or migraine.     . B Complex Vitamins (VITAMIN B-COMPLEX PO) Take 1 tablet by mouth daily.    . Cholecalciferol (VITAMIN D-3 PO) Take 1 capsule by mouth 2 (two) times daily after a meal.     No current facility-administered medications for this encounter.     Vitals:   03/09/19 1519  BP: (!) 128/108  Pulse: (!) 106  SpO2: 98%  Weight: 91.2 kg (201 lb)   Wt Readings from Last 3 Encounters:  03/09/19 91.2 kg (201 lb)  02/25/19 87.3 kg (192 lb 8 oz)    PHYSICAL EXAM: General:  Well appearing. No resp difficulty HEENT: normal Neck: supple. JVP 11-12 . Carotids 2+ bilaterally; no bruits. No lymphadenopathy or thryomegaly appreciated. Cor: PMI normal. Regular rate & rhythm. No rubs, gallops or murmurs. Lungs: clear Abdomen: soft, nontender, nondistended. No hepatosplenomegaly. No bruits or masses. Good bowel sounds. Extremities: no cyanosis, clubbing, rash, R and LLE 1+ edema Neuro: alert & orientedx3, cranial nerves grossly intact. Moves all 4 extremities w/o difficulty. Affect pleasant.   ECG: NSR 100 bpm QRS 114 ms   ASSESSMENT & PLAN: 1. Chronic  systolic CHF: Echo with EF 20-25%, diffuse hypokinesis, normal RV, moderate MR. Cause uncertain. Does have  history of HTN. Cath showed no significant coronary disease. Uncle has CHF, no other family members. No ETOH/drugs. TSH was ok. No definite pre-existing URI/viral symptoms. CMRI- LVEF 17% RV normal. And moderate MR/moderate TR. LHC normal cors. RHC Cardiac Output 5.8/ cardiac Index 2.9.  - NYHA II-III. Volume status trending up. Increase lasix to 40 mg daily. - Increase entresto to 49-51 mg twice a day. Referred to HFSW today for patient assistance.  -  Continue Coreg 3.125 mg bid. Anticipate increasing next visit.  - Continue spironolactone 12.5 daily.  -Check BMET today. - Discussed that we will repeat ECHO in 3 months.  2. ?CKD  stage 3: Unsure of baseline.  -Check BMET today.   3. HTN: Increase entresto as noted above.  4. Hyperlipidemia: Very high LDL, continue atorvastatin.   Follow up in 2 weeks to continue to titrate HF medications.

## 2019-03-09 NOTE — Patient Instructions (Signed)
Lab work was done today. We will notify you of any abnormal lab work. No news is good news!  INCREASE Entresto to 49-51mg  (1 tab) two times daily.  INCREASE Furosemide to 40mg  (1 tab) daily  You have been referred to our heart failure social worker. She will contact you.  Please follow up with the Advanced Practice Provider in 2 weeks.

## 2019-03-09 NOTE — Progress Notes (Signed)
Samples given. 2 bottles of Entresto 49-51. Lot #[id number]. Expiration: Jun 2022. Patient assistance forms given to pt

## 2019-03-10 ENCOUNTER — Telehealth (HOSPITAL_COMMUNITY): Payer: Self-pay

## 2019-03-10 NOTE — Telephone Encounter (Signed)
[employer] PA Form for [employer] 49/51mg  faxed to [phone]

## 2019-03-23 ENCOUNTER — Ambulatory Visit (HOSPITAL_COMMUNITY)
Admission: RE | Admit: 2019-03-23 | Discharge: 2019-03-23 | Disposition: A | Discharge status: Home / Self Care | Payer: Self-pay | Source: Ambulatory Visit | Attending: Internal Medicine | Admitting: Internal Medicine

## 2019-03-23 ENCOUNTER — Encounter (HOSPITAL_COMMUNITY): Payer: Self-pay

## 2019-03-23 ENCOUNTER — Other Ambulatory Visit: Payer: Self-pay

## 2019-03-23 VITALS — BP 134/110 | HR 108 | Wt 207.6 lb

## 2019-03-23 DIAGNOSIS — Z79899 Other long term (current) drug therapy: Secondary | ICD-10-CM | POA: Insufficient documentation

## 2019-03-23 DIAGNOSIS — N183 Chronic kidney disease, stage 3 (moderate): Secondary | ICD-10-CM | POA: Insufficient documentation

## 2019-03-23 DIAGNOSIS — Z7982 Long term (current) use of aspirin: Secondary | ICD-10-CM | POA: Insufficient documentation

## 2019-03-23 DIAGNOSIS — I130 Hypertensive heart and chronic kidney disease with heart failure and stage 1 through stage 4 chronic kidney disease, or unspecified chronic kidney disease: Secondary | ICD-10-CM | POA: Insufficient documentation

## 2019-03-23 DIAGNOSIS — I5022 Chronic systolic (congestive) heart failure: Secondary | ICD-10-CM

## 2019-03-23 DIAGNOSIS — Z8249 Family history of ischemic heart disease and other diseases of the circulatory system: Secondary | ICD-10-CM | POA: Insufficient documentation

## 2019-03-23 DIAGNOSIS — E785 Hyperlipidemia, unspecified: Secondary | ICD-10-CM | POA: Insufficient documentation

## 2019-03-23 LAB — BASIC METABOLIC PANEL
Anion gap: 11 (ref 5–15)
BUN: 19 mg/dL (ref 6–20)
CO2: 21 mmol/L — ABNORMAL LOW (ref 22–32)
Calcium: 9.2 mg/dL (ref 8.9–10.3)
Chloride: 109 mmol/L (ref 98–111)
Creatinine, Ser: 1.32 mg/dL — ABNORMAL HIGH (ref 0.61–1.24)
GFR calc Af Amer: >60 mL/min (ref >60)
GFR calc non Af Amer: >60 mL/min (ref >60)
Glucose, Bld: 111 mg/dL — ABNORMAL HIGH (ref 70–99)
Potassium: 3.6 mmol/L (ref 3.5–5.1)
Sodium: 141 mmol/L (ref 135–145)

## 2019-03-23 LAB — BRAIN NATRIURETIC PEPTIDE: B Natriuretic Peptide: 999.7 pg/mL — ABNORMAL HIGH (ref 0.0–100.0)

## 2019-03-23 MED ORDER — SACUBITRIL-VALSARTAN 97-103 MG PO TABS: 1.0000 | ORAL_TABLET | Freq: Two times a day (BID) | ORAL | 6 refills | Status: DC

## 2019-03-23 MED ORDER — FUROSEMIDE 40 MG PO TABS: 40.0000 mg | ORAL_TABLET | Freq: Every day | ORAL | 3 refills | Status: DC

## 2019-03-23 MED ORDER — CARVEDILOL 3.125 MG PO TABS: 3.1250 mg | ORAL_TABLET | Freq: Two times a day (BID) | ORAL | 0 refills | Status: DC

## 2019-03-23 MED ORDER — ASPIRIN 81 MG PO TBEC: 81.0000 mg | DELAYED_RELEASE_TABLET | Freq: Every day | ORAL | 0 refills | Status: DC

## 2019-03-23 MED ORDER — SPIRONOLACTONE 25 MG PO TABS: 12.5000 mg | ORAL_TABLET | Freq: Every day | ORAL | 0 refills | Status: DC

## 2019-03-23 MED ORDER — ATORVASTATIN CALCIUM 40 MG PO TABS: 40.0000 mg | ORAL_TABLET | Freq: Every day | ORAL | 0 refills | Status: DC

## 2019-03-23 MED FILL — ATORVASTATIN 40 MG TABLET: 40 | 30 days supply | Qty: 30 | Fill #0

## 2019-03-23 MED FILL — CARVEDILOL 3.125 MG TABLET: 3.125 | 30 days supply | Qty: 60 | Fill #0

## 2019-03-23 MED FILL — ASPIRIN ADULT LOW STRENGTH: 81 | 30 days supply | Qty: 30 | Fill #0

## 2019-03-23 MED FILL — SPIRONOLACTONE 25 MG TABLET: 25 | 30 days supply | Qty: 15 | Fill #0

## 2019-03-23 NOTE — Patient Instructions (Signed)
Lab work done today. We will notify you of any abnormal lab work. No news is good news!  INCREASE Entresto to 97/103 mg two times daily. You make take the pills your already have (49/51mg  ) 2 tabs two times daily.   Please take an extra tab of furosemide TODAY ONLY.  Please follow up with the [facility] in 3 weeks.  At the [facility], you and your health needs are our priority. As part of our continuing mission to provide you with exceptional heart care, we have created designated Provider Care Teams. These Care Teams include your primary Cardiologist (physician) and Advanced Practice Providers (APPs- Physician Assistants and Nurse Practitioners) who all work together to provide you with the care you need, when you need it.   You may see any of the following providers on your designated Care Team at your next follow up: Marland Kitchen Dr Glori Bickers . Dr Loralie Champagne . Darrick Grinder, NP

## 2019-03-23 NOTE — Progress Notes (Signed)
PCP: None  Primary Cardiologist: Dr Shirlee LatchMcLean   HPI: Nathaniel Reasonhomas E Villalpando Jr. is a 53 y.o. male with a history of HTN, HLD, and anemia. No known cardiac history. He had an Uncle with heart failure.   Admitted 5/16/20202 with increased dyspnea and cough. CTA negative for PE, COVID-19 negative. Diuresed with IV lasix. Echo completed showed newly reduced EF 20-25%, so advanced HF team was consulted. He underwent RHC/LHC that showed preserved cardiac output. No cad, and optimized filling pressures. Hf meds started. Discharge weight 192.5 pounds.   Today he returns for 2 week follow up. Last visit entresto was increased to 49-51 mg twice a day. Overall feeling ok but having some fatigue. + orthopnea. Able to walk 30 minutes. . Denies PND. Appeite ok. Over the weekend he was drinking extra fluid and drinking Gatorade every day. No fever or chills. Weight at home trending up 197--->202  pounds. Taking all medications. He has no insurance.    Lives with his wife. 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    ECHO 02/22/19  EF 20-25% RV normal.   RHC./LHC 02/24/2019  RA 4 RV 28/6 PA 29/17 mean 21 PCWP mean 10 LV 92/11 AO 97/78 Oxygen saturations: PA 73% AO 97% Cardiac Output (Fick) 5.89  Cardiac Index (Fick) 2.93  CMRI 02/25/2019  1. Moderate LV dilation with diffuse, severe hypokinesis, EF 17%. 2.  Normal RV size and systolic function, EF 46%. 3.  Moderate MR and moderate TR. ROS: All systems negative except as listed in HPI, PMH and Problem List.  SH:  Social History   Socioeconomic History  . Marital status: Married    Spouse name: Not on file  . Number of children: Not on file  . Years of education: Not on file  . Highest education level: Not on file  Occupational History  . Not on file  Social Needs  . Financial resource strain: Not on file  . Food insecurity    Worry: Not on file    Inability: Not on file  . Transportation needs    Medical: Not on file   Non-medical: Not on file  Tobacco Use  . Smoking status: Never Smoker  . Smokeless tobacco: Never Used  . Tobacco comment: Married, lives with spouse. Works as Pension scheme managermanager ar [employer]  Substance and Sexual Activity  . Alcohol use: No  . Drug use: No  . Sexual activity: Yes  Lifestyle  . Physical activity    Days per week: Not on file    Minutes per session: Not on file  . Stress: Not on file  Relationships  . Social Musicianconnections    Talks on phone: Not on file    Gets together: Not on file    Attends religious service: Not on file    Active member of club or organization: Not on file    Attends meetings of clubs or organizations: Not on file    Relationship status: Not on file  . Intimate partner violence    Fear of current or ex partner: Not on file    Emotionally abused: Not on file    Physically abused: Not on file    Forced sexual activity: Not on file  Other Topics Concern  . Not on file  Social History Narrative  . Not on file    FH:  Family History  Problem Relation Age of Onset  . Hypertension Paternal Grandfather     Past Medical History:  Diagnosis Date  . ANEMIA-NOS  12/05/2009  . DYSLIPIDEMIA 01/09/2010  . Headache(784.0) 12/05/2009  . Hemorrhoids   . HYPERTENSION 12/05/2009    Current Outpatient Medications  Medication Sig Dispense Refill  . Ascorbic Acid (VITAMIN C PO) Take 1 tablet by mouth daily.    Marland Kitchen aspirin EC 81 MG EC tablet Take 1 tablet (81 mg total) by mouth daily for 30 days. 30 tablet 0  . aspirin-acetaminophen-caffeine (EXCEDRIN MIGRAINE) 505-397-67 MG per tablet Take 2 tablets by mouth every 6 (six) hours as needed for headache or migraine.     Marland Kitchen atorvastatin (LIPITOR) 40 MG tablet Take 1 tablet (40 mg total) by mouth daily at 6 PM for 30 days. 30 tablet 0  . B Complex Vitamins (VITAMIN B-COMPLEX PO) Take 1 tablet by mouth daily.    . carvedilol (COREG) 3.125 MG tablet Take 1 tablet (3.125 mg total) by mouth 2 (two) times daily with a meal for 30  days. 60 tablet 0  . Cholecalciferol (VITAMIN D-3 PO) Take 1 capsule by mouth 2 (two) times daily after a meal.    . furosemide (LASIX) 40 MG tablet Take 1 tablet (40 mg total) by mouth daily for 30 days. 90 tablet 3  . sacubitril-valsartan (ENTRESTO) 49-51 MG Take 1 tablet by mouth 2 (two) times daily. 180 tablet 3  . spironolactone (ALDACTONE) 25 MG tablet Take 0.5 tablets (12.5 mg total) by mouth daily for 30 days. 15 tablet 0   No current facility-administered medications for this encounter.     Vitals:   03/23/19 0921  BP: (!) 134/110  Pulse: (!) 108  SpO2: 98%  Weight: 94.2 kg (207 lb 9.6 oz)   Wt Readings from Last 3 Encounters:  03/23/19 94.2 kg (207 lb 9.6 oz)  03/09/19 91.2 kg (201 lb)  02/25/19 87.3 kg (192 lb 8 oz)    PHYSICAL EXAM: General:  Well appearing. No resp difficulty HEENT: normal Neck: supple. JVPp 10-11. Carotids 2+ bilat; no bruits. No lymphadenopathy or thryomegaly appreciated. Cor: PMI nondisplaced. Regular rate & rhythm. No rubs, gallops or murmurs. Lungs: clear Abdomen: soft, nontender, nondistended. No hepatosplenomegaly. No bruits or masses. Good bowel sounds. Extremities: no cyanosis, clubbing, rash, R and LLE ankles 1+  edema Neuro: alert & orientedx3, cranial nerves grossly intact. moves all 4 extremities w/o difficulty. Affect pleasant   ECG: Sinus Tach 109 bpm   ASSESSMENT & PLAN: 1. Chronic  systolic CHF: Echo with EF 20-25%, diffuse hypokinesis, normal RV, moderate MR. Cause uncertain. Does have  history of HTN. Cath showed no significant coronary disease and preserved cardiac output. Uncle has CHF, no other family members. No ETOH/drugs. TSH was ok. No definite pre-existing URI/viral symptoms. CMRI- LVEF 17% RV normal. And moderate MR/moderate TR. LHC normal cors. RHC Cardiac Output 5.8/ cardiac Index 2.9.  -NYHA IIIb. -Volume status trending up and I suspect this is due to increased sodium intake from Gatorade. I have asked him to  stop drinking Gatorade all together and to limit fluid intake to < 2 liters per day.  - Continue lasix 40 mg daily and he will take an extra 40 mg of lasix today.  - Continue low dose coreg.  - Increase entresto to 97-103 twice a day.He has been referred to HFSW for patient assistance. Check BMET today.   Continue spironolactone 12.5 daily. - Consider bidil next visit.  - Discussed that we will repeat ECHO in 3 months--> end of August.  2. ?CKD stage 3: Unsure of baseline.  -Check BMET today.  3. GMW:[id number].  4. Hyperlipidemia: Very high LDL, continue atorvastatin.  Follow up in 3 weeks.  Velda Wendt NP-C  10:18 AM

## 2019-03-25 ENCOUNTER — Other Ambulatory Visit (HOSPITAL_COMMUNITY): Payer: Self-pay

## 2019-03-25 ENCOUNTER — Telehealth (HOSPITAL_COMMUNITY): Payer: Self-pay | Admitting: Licensed Clinical Social Worker

## 2019-03-25 MED ORDER — SACUBITRIL-VALSARTAN 97-103 MG PO TABS: 1.0000 | ORAL_TABLET | Freq: Two times a day (BID) | ORAL | 3 refills | Status: DC

## 2019-03-25 NOTE — Telephone Encounter (Signed)
Notification received that pt has been approved to receive Entresto through the [employer] for free. Eligibility expires 03/24/2020  CSW called pt to inform and provided him with [employer] phone number to call and set up initial shipment and confirm that correct does is being sent out as we had to send in new script today for increased dose- pt expressed understanding  CSW will continue to follow and assist as needed  Jorge Ny, [facility] Desk#: [phone] Cell#: [phone]

## 2019-03-25 NOTE — Addendum Note (Signed)
Addended by: Valeda Malm on: 03/25/2019 09:35 AM   Modules accepted: Orders

## 2019-03-25 NOTE — Telephone Encounter (Signed)
CSW received call from clinic to assist with pt medication concerns.  Pt currently without insurance and in need of Entresto- current supply running out next week.  Pt is trying to get insurance outside of his employer as there were issues with getting insurance coverage related to COVID-19 but he has been unable to secure this at this time.  Pt applied for [employer] when at the clinic but we have not heard a determination.  CSW called [employer] who reports they haven't given determination because his application indicates he has insurance but does not provide any insurance information.  CSW informed [employer] that pt is currently uninsured- they will now push application forward to review and will hopefully have a determination in the next few days.  CSW also informed that pt dose had increased since application sent in- CSW had clinic RN send in updated dose to [employer].  CSW will continue to follow and assist as needed  Jorge Ny, [facility] Desk#: [phone] Cell#: [phone]

## 2019-03-27 ENCOUNTER — Telehealth (HOSPITAL_COMMUNITY): Payer: Self-pay | Admitting: Licensed Clinical Social Worker

## 2019-03-27 NOTE — Telephone Encounter (Signed)
Pt called CSW to get assistance with following up on [employer] shipment.  Per patient had called [employer] today and was told there was an issue finalizing his shipment.    CSW called [employer] and confirmed that they were unable to confirm shipment details with patient due to technical details from their pharmacy department.  When CSW called back they were able to contact pharmacy and saw that they had not received the patients script with the new dose of Entresto (97-103).  Pharmacy states they did not receive the script which was sent Wednesday and requests another script be sent which would likely take 48 hours to process.  CSW messaged clinic to send in new script for patient and requested that samples be placed at the front desk for pt to pick up early next week as he is almost out.  CSW will continue to follow and assist as needed  Jorge Ny, [facility] Desk#: [phone] Cell#: [phone]

## 2019-03-30 ENCOUNTER — Other Ambulatory Visit (HOSPITAL_COMMUNITY): Payer: Self-pay

## 2019-03-30 MED ORDER — SACUBITRIL-VALSARTAN 97-103 MG PO TABS: 1.0000 | ORAL_TABLET | Freq: Two times a day (BID) | ORAL | 3 refills | Status: DC

## 2019-03-30 MED FILL — FUROSEMIDE 40 MG TAB: 40 | 30 days supply | Qty: 30 | Fill #1

## 2019-03-30 NOTE — Telephone Encounter (Signed)
2 bottles 49/51mg  left at front desk for pickup   LOT [id number] EXP June 2022

## 2019-04-01 ENCOUNTER — Telehealth (HOSPITAL_COMMUNITY): Payer: Self-pay | Admitting: Licensed Clinical Social Worker

## 2019-04-01 NOTE — Telephone Encounter (Signed)
CSW called [employer] to confirm they had received new prescription for patient.  They were able to confirm that they received the new script for pt 97-103 dose- also state that pt had called and set up initial shipment.  CSW called pt to confirm no further questions- unable to reach- left message.  Jorge Ny, LCSW Clinical Social Worker [facility] Desk#: [phone] Cell#: [phone]

## 2019-04-15 ENCOUNTER — Other Ambulatory Visit: Payer: Self-pay

## 2019-04-15 ENCOUNTER — Other Ambulatory Visit (HOSPITAL_COMMUNITY): Payer: Self-pay | Admitting: Adult Health

## 2019-04-15 ENCOUNTER — Ambulatory Visit (HOSPITAL_COMMUNITY)
Admission: RE | Admit: 2019-04-15 | Discharge: 2019-04-15 | Disposition: A | Discharge status: Home / Self Care | Payer: Self-pay | Source: Ambulatory Visit | Attending: Cardiology | Admitting: Cardiology

## 2019-04-15 VITALS — BP 128/96 | HR 95 | Wt 206.2 lb

## 2019-04-15 DIAGNOSIS — I10 Essential (primary) hypertension: Secondary | ICD-10-CM

## 2019-04-15 DIAGNOSIS — I5022 Chronic systolic (congestive) heart failure: Secondary | ICD-10-CM

## 2019-04-15 DIAGNOSIS — Z79899 Other long term (current) drug therapy: Secondary | ICD-10-CM | POA: Insufficient documentation

## 2019-04-15 DIAGNOSIS — Z8249 Family history of ischemic heart disease and other diseases of the circulatory system: Secondary | ICD-10-CM | POA: Insufficient documentation

## 2019-04-15 DIAGNOSIS — Z7982 Long term (current) use of aspirin: Secondary | ICD-10-CM | POA: Insufficient documentation

## 2019-04-15 DIAGNOSIS — I130 Hypertensive heart and chronic kidney disease with heart failure and stage 1 through stage 4 chronic kidney disease, or unspecified chronic kidney disease: Secondary | ICD-10-CM | POA: Insufficient documentation

## 2019-04-15 DIAGNOSIS — E785 Hyperlipidemia, unspecified: Secondary | ICD-10-CM | POA: Insufficient documentation

## 2019-04-15 DIAGNOSIS — N183 Chronic kidney disease, stage 3 unspecified: Secondary | ICD-10-CM

## 2019-04-15 LAB — BASIC METABOLIC PANEL
Anion gap: 9 (ref 5–15)
BUN: 22 mg/dL — ABNORMAL HIGH (ref 6–20)
CO2: 20 mmol/L — ABNORMAL LOW (ref 22–32)
Calcium: 9.4 mg/dL (ref 8.9–10.3)
Chloride: 110 mmol/L (ref 98–111)
Creatinine, Ser: 1.21 mg/dL (ref 0.61–1.24)
GFR calc Af Amer: >60 mL/min (ref >60)
GFR calc non Af Amer: >60 mL/min (ref >60)
Glucose, Bld: 105 mg/dL — ABNORMAL HIGH (ref 70–99)
Potassium: 4.2 mmol/L (ref 3.5–5.1)
Sodium: 139 mmol/L (ref 135–145)

## 2019-04-15 MED ORDER — CARVEDILOL 6.25 MG PO TABS: 6.2500 mg | ORAL_TABLET | Freq: Two times a day (BID) | ORAL | 5 refills | Status: DC

## 2019-04-15 MED ORDER — ATORVASTATIN CALCIUM 40 MG PO TABS: 40.0000 mg | ORAL_TABLET | Freq: Every day | ORAL | 0 refills | Status: DC

## 2019-04-15 MED ORDER — FUROSEMIDE 40 MG PO TABS: 40.0000 mg | ORAL_TABLET | Freq: Every day | ORAL | 3 refills | Status: DC

## 2019-04-15 MED ORDER — SPIRONOLACTONE 25 MG PO TABS: 25.0000 mg | ORAL_TABLET | Freq: Every day | ORAL | 5 refills | Status: DC

## 2019-04-15 MED FILL — SPIRONOLACTONE 25 MG TABLET: 25 | 30 days supply | Qty: 30 | Fill #0

## 2019-04-15 MED FILL — FUROSEMIDE 40 MG TAB: 40 | 30 days supply | Qty: 30 | Fill #0

## 2019-04-15 MED FILL — ATORVASTATIN 40 MG TABLET: 40 | 30 days supply | Qty: 30 | Fill #0

## 2019-04-15 MED FILL — CARVEDILOL 6.25 MG TABLET: 6.25 | 30 days supply | Qty: 60 | Fill #0

## 2019-04-15 NOTE — Progress Notes (Signed)
PCP: None  Primary Cardiologist: Dr Shirlee Latch   HPI: Nathaniel Carr. is a 53 y.o. male with a history of HTN, HLD, and anemia. No known cardiac history. He had an Uncle with heart failure.   Admitted 5/16/20202 with increased dyspnea and cough. CTA negative for PE, COVID-19 negative. Diuresed with IV lasix. Echo completed showed newly reduced EF 20-25%, so advanced HF team was consulted. He underwent RHC/LHC that showed preserved cardiac output. No cad, and optimized filling pressures. Hf meds started. Discharge weight 192.5 pounds.   Today he returns for HF follow up. Last visit entresto was increased to 97-103 mg twice a day. Overall feeling fine. Denies SOB/PND/Orthopnea. Appetite ok. No fever or chills. Weight at home 197 pounds. Taking all medications.     SH: Lives with his wife. 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    ECHO 02/22/19  EF 20-25% RV normal.   RHC./LHC 02/24/2019  RA 4 RV 28/6 PA 29/17 mean 21 PCWP mean 10 LV 92/11 AO 97/78 Oxygen saturations: PA 73% AO 97% Cardiac Output (Fick) 5.89  Cardiac Index (Fick) 2.93  CMRI 02/25/2019  1. Moderate LV dilation with diffuse, severe hypokinesis, EF 17%. 2.  Normal RV size and systolic function, EF 46%. 3.  Moderate MR and moderate TR. ROS: All systems negative except as listed in HPI, PMH and Problem List.  SH:  Social History   Socioeconomic History  . Marital status: Married    Spouse name: Not on file  . Number of children: Not on file  . Years of education: Not on file  . Highest education level: Not on file  Occupational History  . Not on file  Social Needs  . Financial resource strain: Not on file  . Food insecurity    Worry: Not on file    Inability: Not on file  . Transportation needs    Medical: Not on file    Non-medical: Not on file  Tobacco Use  . Smoking status: Never Smoker  . Smokeless tobacco: Never Used  . Tobacco comment: Married, lives with spouse. Works as  Pension scheme manager  Substance and Sexual Activity  . Alcohol use: No  . Drug use: No  . Sexual activity: Yes  Lifestyle  . Physical activity    Days per week: Not on file    Minutes per session: Not on file  . Stress: Not on file  Relationships  . Social Musician on phone: Not on file    Gets together: Not on file    Attends religious service: Not on file    Active member of club or organization: Not on file    Attends meetings of clubs or organizations: Not on file    Relationship status: Not on file  . Intimate partner violence    Fear of current or ex partner: Not on file    Emotionally abused: Not on file    Physically abused: Not on file    Forced sexual activity: Not on file  Other Topics Concern  . Not on file  Social History Narrative  . Not on file    FH:  Family History  Problem Relation Age of Onset  . Hypertension Paternal Grandfather     Past Medical History:  Diagnosis Date  . ANEMIA-NOS 12/05/2009  . DYSLIPIDEMIA 01/09/2010  . Headache(784.0) 12/05/2009  . Hemorrhoids   . HYPERTENSION 12/05/2009    Current Outpatient Medications  Medication Sig Dispense Refill  .  Ascorbic Acid (VITAMIN C PO) Take 1 tablet by mouth daily.    Marland Kitchen aspirin 81 MG EC tablet Take 1 tablet (81 mg total) by mouth daily for 30 days. 30 tablet 0  . aspirin-acetaminophen-caffeine (EXCEDRIN MIGRAINE) 572-620-35 MG per tablet Take 2 tablets by mouth every 6 (six) hours as needed for headache or migraine.     Marland Kitchen atorvastatin (LIPITOR) 40 MG tablet Take 1 tablet (40 mg total) by mouth daily at 6 PM for 30 days. 30 tablet 0  . B Complex Vitamins (VITAMIN B-COMPLEX PO) Take 1 tablet by mouth daily.    . carvedilol (COREG) 3.125 MG tablet Take 1 tablet (3.125 mg total) by mouth 2 (two) times daily with a meal for 30 days. 60 tablet 0  . Cholecalciferol (VITAMIN D-3 PO) Take 1 capsule by mouth 2 (two) times daily after a meal.    . furosemide (LASIX) 40 MG tablet Take 1 tablet  (40 mg total) by mouth daily for 30 days. 90 tablet 3  . sacubitril-valsartan (ENTRESTO) 97-103 MG Take 1 tablet by mouth 2 (two) times daily. 180 tablet 3  . spironolactone (ALDACTONE) 25 MG tablet Take 0.5 tablets (12.5 mg total) by mouth daily for 30 days. 15 tablet 0   No current facility-administered medications for this encounter.     Vitals:   04/15/19 1133  BP: (!) 128/96  Pulse: 95  SpO2: 98%  Weight: 93.5 kg (206 lb 3.2 oz)   Wt Readings from Last 3 Encounters:  04/15/19 93.5 kg (206 lb 3.2 oz)  03/23/19 94.2 kg (207 lb 9.6 oz)  03/09/19 91.2 kg (201 lb)    PHYSICAL EXAM: General:  Well appearing. No resp difficulty HEENT: normal Neck: supple. no JVD. Carotids 2+ bilat; no bruits. No lymphadenopathy or thryomegaly appreciated. Cor: PMI nondisplaced. Regular rate & rhythm. No rubs, gallops or murmurs. Lungs: clear Abdomen: soft, nontender, nondistended. No hepatosplenomegaly. No bruits or masses. Good bowel sounds. Extremities: no cyanosis, clubbing, rash, edema Neuro: alert & orientedx3, cranial nerves grossly intact. moves all 4 extremities w/o difficulty. Affect pleasant   ASSESSMENT & PLAN: 1. Chronic  systolic CHF: Echo with EF 20-25%, diffuse hypokinesis, normal RV, moderate MR. Cause uncertain. Does have  history of HTN. Cath showed no significant coronary disease and preserved cardiac output. Uncle has CHF, no other family members. No ETOH/drugs. TSH was ok. No definite pre-existing URI/viral symptoms. CMRI- LVEF 17% RV normal. And moderate MR/moderate TR. LHC normal cors. RHC Cardiac Output 5.8/ cardiac Index 2.9.  NYHA II. Volume status stable. Continue lasix lasix 40 mg daily.  - Increase coreg to 6.25 mg twice a day.   - Continue entresto to 97-103 twice a day.  - Increase spironolactone 25 mg daily. Check BMET in 14 days.  - Consider hydralazine/imdur  next visit.  - Discussed that we will repeat ECHO in 3 months--> end of August.  2. ?CKD stage 3:  Unsure of baseline.  Check BMET    3. DHR:CBULAGTX. Increase spironolactone 25 mg daily.  4. Hyperlipidemia: Very high LDL, continue atorvastatin.  Follow up in 2 weeks and 8 weeks with Dr Aundra Dubin with an ECHO.   Darrie Macmillan NP-C  11:27 AM

## 2019-04-15 NOTE — Patient Instructions (Signed)
Labs done today. We will call you only if labs are abnormal.  INCREASE Carvedilol to 6.25mg  (1 tab) by mouth two times a day.  INCREASE Spirolactone to 25mg (1 tab) by mouth once daily.   HF medications were refilled.  Your physician recommends that you schedule a follow-up appointment in: 2 weeks with Amy and in 8 weeks with Dr. Aundra Dubin followed by an Echo.  Your physician has requested that you have an echocardiogram. Echocardiography is a painless test that uses sound waves to create images of your heart. It provides your doctor with information about the size and shape of your heart and how well your heart's chambers and valves are working. This procedure takes approximately one hour. There are no restrictions for this procedure.  At the [facility], you and your health needs are our priority. As part of our continuing mission to provide you with exceptional heart care, we have created designated Provider Care Teams. These Care Teams include your primary Cardiologist (physician) and Advanced Practice Providers (APPs- Physician Assistants and Nurse Practitioners) who all work together to provide you with the care you need, when you need it.   You may see any of the following providers on your designated Care Team at your next follow up: Marland Kitchen Dr Glori Bickers . Dr Loralie Champagne . Darrick Grinder, NP

## 2019-05-03 NOTE — Progress Notes (Signed)
PCP: None  Primary Cardiologist: Dr Aundra Dubin   HPI: Nathaniel Carr. is a 53 y.o. male with a history of HTN, HLD, and anemia. No known cardiac history. He had an Uncle with heart failure.   Admitted 5/16/20202 with increased dyspnea and cough. CTA negative for PE, COVID-19 negative. Diuresed with IV lasix. Echo completed showed newly reduced EF 20-25%, so advanced HF team was consulted. He underwent RHC/LHC that showed preserved cardiac output. No cad, and optimized filling pressures. Hf meds started. Discharge weight 192.5 pounds.   Today he returns for HF follow up. Last visit entresto was increased to 97-103 mg twice a day. Overall feeling fine. Occasionally has a dry cough but not every day. No chest pain.  Denies SOB/PND/Orthopnea. Appetite ok. No fever or chills. Weight at home 199-202 pounds. Taking all medications.   Lives with his wife. 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    ECHO 02/22/19  EF 20-25% RV normal.   RHC./LHC 02/24/2019 No significant coronary disease.   RA 4 RV 28/6 PA 29/17 mean 21 PCWP mean 10 LV 92/11 AO 97/78 Oxygen saturations: PA 73% AO 97% Cardiac Output (Fick) 5.89  Cardiac Index (Fick) 2.93  CMRI 02/25/2019  1. Moderate LV dilation with diffuse, severe hypokinesis, EF 17%. 2.  Normal RV size and systolic function, EF 38%. 3.  Moderate MR and moderate TR. ROS: All systems negative except as listed in HPI, PMH and Problem List.  SH:  Social History   Socioeconomic History  . Marital status: Married    Spouse name: Not on file  . Number of children: Not on file  . Years of education: Not on file  . Highest education level: Not on file  Occupational History  . Not on file  Social Needs  . Financial resource strain: Not on file  . Food insecurity    Worry: Not on file    Inability: Not on file  . Transportation needs    Medical: Not on file    Non-medical: Not on file  Tobacco Use  . Smoking status: Never  Smoker  . Smokeless tobacco: Never Used  . 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 Alcohol use: No  . Drug use: No  . Sexual activity: Yes  Lifestyle  . Physical activity    Days per week: Not on file    Minutes per session: Not on file  . Stress: Not on file  Relationships  . Social Herbalist on phone: Not on file    Gets together: Not on file    Attends religious service: Not on file    Active member of club or organization: Not on file    Attends meetings of clubs or organizations: Not on file    Relationship status: Not on file  . Intimate partner violence    Fear of current or ex partner: Not on file    Emotionally abused: Not on file    Physically abused: Not on file    Forced sexual activity: Not on file  Other Topics Concern  . Not on file  Social History Narrative  . Not on file    FH:  Family History  Problem Relation Age of Onset  . Hypertension Paternal Grandfather     Past Medical History:  Diagnosis Date  . ANEMIA-NOS 12/05/2009  . DYSLIPIDEMIA 01/09/2010  . Headache(784.0) 12/05/2009  . Hemorrhoids   .  HYPERTENSION 12/05/2009    Current Outpatient Medications  Medication Sig Dispense Refill  . Ascorbic Acid (VITAMIN C PO) Take 1 tablet by mouth daily.    . ASPIRIN ADULT LOW STRENGTH 81 MG EC tablet TAKE 1 TABLET BY MOUTH DAILY FOR 30 DAYS. 30 tablet 0  . aspirin-acetaminophen-caffeine (EXCEDRIN MIGRAINE) 250-250-65 MG per tablet Take 2 tablets by mouth every 6 (six) hours as needed for headache or migraine.     Marland Kitchen atorvastatin (LIPITOR) 40 MG tablet Take 1 tablet (40 mg total) by mouth daily at 6 PM. 30 tablet 0  . carvedilol (COREG) 6.25 MG tablet Take 1 tablet (6.25 mg total) by mouth 2 (two) times daily with a meal. 60 tablet 5  . furosemide (LASIX) 40 MG tablet Take 1 tablet (40 mg total) by mouth daily. 90 tablet 3  . sacubitril-valsartan (ENTRESTO) 97-103 MG Take 1  tablet by mouth 2 (two) times daily. 180 tablet 3  . spironolactone (ALDACTONE) 25 MG tablet Take 1 tablet (25 mg total) by mouth daily. 30 tablet 5  . Cholecalciferol (VITAMIN D-3 PO) Take 1 capsule by mouth 2 (two) times daily after a meal.     No current facility-administered medications for this encounter.     Vitals:   05/04/19 1154  BP: 103/78  Pulse: 86  SpO2: 97%  Weight: 93.6 kg (206 lb 6.4 oz)   Wt Readings from Last 3 Encounters:  05/04/19 93.6 kg (206 lb 6.4 oz)  04/15/19 93.5 kg (206 lb 3.2 oz)  03/23/19 94.2 kg (207 lb 9.6 oz)    PHYSICAL EXAM: General:  Well appearing. No resp difficulty HEENT: normal Neck: supple. no JVD. Carotids 2+ bilat; no bruits. No lymphadenopathy or thryomegaly appreciated. Cor: PMI nondisplaced. Regular rate & rhythm. No rubs, gallops or murmurs. Lungs: clear Abdomen: soft, nontender, nondistended. No hepatosplenomegaly. No bruits or masses. Good bowel sounds. Extremities: no cyanosis, clubbing, rash, edema Neuro: alert & orientedx3, cranial nerves grossly intact. moves all 4 extremities w/o difficulty. Affect pleasant  EKG: NSR 92 bpm QRS 110 ms   ASSESSMENT & PLAN: 1. Chronic  systolic CHF: 02/22/2019 Echo with EF 20-25%, diffuse hypokinesis, normal RV, moderate MR. Cause uncertain. Does have  history of HTN. Cath showed no significant coronary disease and preserved cardiac output. Uncle has CHF, no other family members. No ETOH/drugs. TSH was ok. No definite pre-existing URI/viral symptoms. CMRI- LVEF 17% RV normal. And moderate MR/moderate TR. LHC normal cors. RHC Cardiac Output 5.8/ cardiac Index 2.9.  -NYHA II. Functional  improvement.  - Continue lasix 40 mg daily and he will take an extra 40 mg of lasix today.  - Continue low dose coreg.  - Continue ntresto to 97-103 twice a day.  Continue spironolactone 25 mg  daily. No room for Bidil with soft BP - Discussed that we will repeat ECHO in 3 months--> in September. Discussed  that if EF remains low will need to refer to EP for ICD.  - Check BMET today.   2. ?CKD stage -Check BMET today.    3. HTN: Controlled. Much improved. Continue current regimen.   4. Hyperlipidemia: Very high LDL, continue atorvastatin. Refill atorvastatin today.   Follow up in 4 week with Dr Shirlee Latch and an ECHO.  Amy Clegg NP-C  12:02 PM

## 2019-05-04 ENCOUNTER — Encounter (HOSPITAL_COMMUNITY): Payer: Self-pay

## 2019-05-04 ENCOUNTER — Ambulatory Visit (HOSPITAL_COMMUNITY)
Admission: RE | Admit: 2019-05-04 | Discharge: 2019-05-04 | Disposition: A | Discharge status: Home / Self Care | Payer: Self-pay | Source: Ambulatory Visit | Attending: Cardiology | Admitting: Cardiology

## 2019-05-04 ENCOUNTER — Other Ambulatory Visit: Payer: Self-pay

## 2019-05-04 VITALS — BP 103/78 | HR 86 | Wt 206.4 lb

## 2019-05-04 DIAGNOSIS — E785 Hyperlipidemia, unspecified: Secondary | ICD-10-CM | POA: Insufficient documentation

## 2019-05-04 DIAGNOSIS — Z79899 Other long term (current) drug therapy: Secondary | ICD-10-CM | POA: Insufficient documentation

## 2019-05-04 DIAGNOSIS — I130 Hypertensive heart and chronic kidney disease with heart failure and stage 1 through stage 4 chronic kidney disease, or unspecified chronic kidney disease: Secondary | ICD-10-CM | POA: Insufficient documentation

## 2019-05-04 DIAGNOSIS — I5022 Chronic systolic (congestive) heart failure: Secondary | ICD-10-CM | POA: Insufficient documentation

## 2019-05-04 DIAGNOSIS — N189 Chronic kidney disease, unspecified: Secondary | ICD-10-CM | POA: Insufficient documentation

## 2019-05-04 DIAGNOSIS — Z8249 Family history of ischemic heart disease and other diseases of the circulatory system: Secondary | ICD-10-CM | POA: Insufficient documentation

## 2019-05-04 DIAGNOSIS — I10 Essential (primary) hypertension: Secondary | ICD-10-CM

## 2019-05-04 DIAGNOSIS — N183 Chronic kidney disease, stage 3 unspecified: Secondary | ICD-10-CM

## 2019-05-04 LAB — BASIC METABOLIC PANEL
Anion gap: 9 (ref 5–15)
BUN: 24 mg/dL — ABNORMAL HIGH (ref 6–20)
CO2: 21 mmol/L — ABNORMAL LOW (ref 22–32)
Calcium: 9.4 mg/dL (ref 8.9–10.3)
Chloride: 109 mmol/L (ref 98–111)
Creatinine, Ser: 1.4 mg/dL — ABNORMAL HIGH (ref 0.61–1.24)
GFR calc Af Amer: >60 mL/min (ref >60)
GFR calc non Af Amer: 58 mL/min — ABNORMAL LOW (ref >60)
Glucose, Bld: 78 mg/dL (ref 70–99)
Potassium: 4.3 mmol/L (ref 3.5–5.1)
Sodium: 139 mmol/L (ref 135–145)

## 2019-05-04 MED ORDER — ATORVASTATIN CALCIUM 40 MG PO TABS: 40.0000 mg | ORAL_TABLET | Freq: Every day | ORAL | 0 refills | Status: DC

## 2019-05-04 MED FILL — ATORVASTATIN 40 MG TABLET: 40 | 30 days supply | Qty: 30 | Fill #0

## 2019-05-04 NOTE — Patient Instructions (Signed)
Lab work done today. We will notify you of any abnormal lab work. No news is good news.  EKG done today.  REFILLED Atorvastatin to [facility].  Please keep follow up appointment.  At the [facility], you and your health needs are our priority. As part of our continuing mission to provide you with exceptional heart care, we have created designated Provider Care Teams. These Care Teams include your primary Cardiologist (physician) and Advanced Practice Providers (APPs- Physician Assistants and Nurse Practitioners) who all work together to provide you with the care you need, when you need it.   You may see any of the following providers on your designated Care Team at your next follow up: Marland Kitchen Dr Glori Bickers . Dr Loralie Champagne . Darrick Grinder, NP

## 2019-05-08 MED FILL — FUROSEMIDE 40 MG TAB: 40 | 30 days supply | Qty: 30 | Fill #1

## 2019-05-08 MED FILL — SPIRONOLACTONE 25 MG TABLET: 25 | 30 days supply | Qty: 30 | Fill #1

## 2019-05-11 MED FILL — CEFDINIR 300 MG CAPSULE: 300 | 10 days supply | Qty: 20 | Fill #0

## 2019-05-21 MED FILL — CARVEDILOL 6.25 MG TABLET: 6.25 | 30 days supply | Qty: 60 | Fill #1

## 2019-06-17 ENCOUNTER — Ambulatory Visit (HOSPITAL_COMMUNITY)
Admission: RE | Admit: 2019-06-17 | Discharge: 2019-06-17 | Disposition: A | Discharge status: Home / Self Care | Payer: Self-pay | Source: Ambulatory Visit | Attending: Adult Health | Admitting: Adult Health

## 2019-06-17 ENCOUNTER — Other Ambulatory Visit: Payer: Self-pay

## 2019-06-17 ENCOUNTER — Encounter (HOSPITAL_COMMUNITY): Payer: Self-pay | Admitting: Cardiology

## 2019-06-17 ENCOUNTER — Other Ambulatory Visit (HOSPITAL_COMMUNITY): Payer: Self-pay | Admitting: Adult Health

## 2019-06-17 ENCOUNTER — Ambulatory Visit (HOSPITAL_BASED_OUTPATIENT_CLINIC_OR_DEPARTMENT_OTHER)
Admission: RE | Admit: 2019-06-17 | Discharge: 2019-06-17 | Disposition: A | Discharge status: Home / Self Care | Payer: Self-pay | Source: Ambulatory Visit | Attending: Cardiology | Admitting: Cardiology

## 2019-06-17 VITALS — BP 110/86 | HR 85 | Wt 207.6 lb

## 2019-06-17 DIAGNOSIS — Z79899 Other long term (current) drug therapy: Secondary | ICD-10-CM | POA: Insufficient documentation

## 2019-06-17 DIAGNOSIS — Z8249 Family history of ischemic heart disease and other diseases of the circulatory system: Secondary | ICD-10-CM | POA: Insufficient documentation

## 2019-06-17 DIAGNOSIS — I5022 Chronic systolic (congestive) heart failure: Secondary | ICD-10-CM

## 2019-06-17 DIAGNOSIS — E785 Hyperlipidemia, unspecified: Secondary | ICD-10-CM | POA: Insufficient documentation

## 2019-06-17 DIAGNOSIS — I428 Other cardiomyopathies: Secondary | ICD-10-CM | POA: Insufficient documentation

## 2019-06-17 DIAGNOSIS — I110 Hypertensive heart disease with heart failure: Secondary | ICD-10-CM | POA: Insufficient documentation

## 2019-06-17 LAB — BASIC METABOLIC PANEL
Anion gap: 10 (ref 5–15)
BUN: 32 mg/dL — ABNORMAL HIGH (ref 6–20)
CO2: 22 mmol/L (ref 22–32)
Calcium: 9.1 mg/dL (ref 8.9–10.3)
Chloride: 106 mmol/L (ref 98–111)
Creatinine, Ser: 1.86 mg/dL — ABNORMAL HIGH (ref 0.61–1.24)
GFR calc Af Amer: 48 mL/min — ABNORMAL LOW (ref >60)
GFR calc non Af Amer: 41 mL/min — ABNORMAL LOW (ref >60)
Glucose, Bld: 111 mg/dL — ABNORMAL HIGH (ref 70–99)
Potassium: 4 mmol/L (ref 3.5–5.1)
Sodium: 138 mmol/L (ref 135–145)

## 2019-06-17 MED ORDER — CARVEDILOL 12.5 MG PO TABS: 12.5000 mg | ORAL_TABLET | Freq: Two times a day (BID) | ORAL | 6 refills | Status: DC

## 2019-06-17 MED FILL — CARVEDILOL 12.5 MG TABLET: 12.5 | 30 days supply | Qty: 60 | Fill #0

## 2019-06-17 MED FILL — ATORVASTATIN 40 MG TABLET: 40 | 30 days supply | Qty: 30 | Fill #0

## 2019-06-17 MED FILL — FUROSEMIDE 40 MG TAB: 40 | 30 days supply | Qty: 30 | Fill #2

## 2019-06-17 MED FILL — SPIRONOLACTONE 25 MG TABLET: 25 | 30 days supply | Qty: 30 | Fill #2

## 2019-06-17 NOTE — Progress Notes (Signed)
  Echocardiogram 2D Echocardiogram has been performed.  Nathaniel Carr Nathaniel Carr 06/17/2019, 10:18 AM

## 2019-06-17 NOTE — Patient Instructions (Signed)
INCREASE Coreg to 12.5mg  (1 tab) twice a day  Labs today We will only contact you if something comes back abnormal or we need to make some changes. Otherwise no news is good news!  Your phycisian referred you to Electrophysiology.  They will call you to schedule an appointment for ICD consideration.   Your physician has recommended that you have a cardiopulmonary stress test (CPX). CPX testing is a non-invasive measurement of heart and lung function. It replaces a traditional treadmill stress test. This type of test provides a tremendous amount of information that relates not only to your present condition but also for future outcomes. This test combines measurements of you ventilation, respiratory gas exchange in the lungs, electrocardiogram (EKG), blood pressure and physical response before, during, and following an exercise protocol.  You will get a call to schedule this appointment.    Your physician recommends that you schedule a follow-up appointment in: 3 weeks with Pharmacy to titrate your medication  Your physician recommends that you schedule a follow-up appointment in: 6 weeks with Dr Aundra Dubin   At the [facility], you and your health needs are our priority. As part of our continuing mission to provide you with exceptional heart care, we have created designated Provider Care Teams. These Care Teams include your primary Cardiologist (physician) and Advanced Practice Providers (APPs- Physician Assistants and Nurse Practitioners) who all work together to provide you with the care you need, when you need it.   You may see any of the following providers on your designated Care Team at your next follow up: Marland Kitchen Dr Glori Bickers . Dr Loralie Champagne . Darrick Grinder, NP   Please be sure to bring in all your medications bottles to every appointment.

## 2019-06-17 NOTE — Progress Notes (Signed)
PCP: Patient, [name] Primary Cardiologist: Dr Aundra Dubin   HPI: Nathaniel Carr. is a 53 y.o. male with a history of HTN, HLD, and anemia. No known prior cardiac history. He had an uncle with heart failure.   Admitted 02/21/2019 with dyspnea and cough. CTA negative for PE, COVID-19 negative. Diagnosed with CHF, diuresed with IV lasix. Echo showed newly reduced EF 20-25%, so HF team was consulted. He underwent RHC/LHC that showed preserved cardiac output, no CAD, and optimized filling pressures post-diuresis. HF meds started. Discharge weight 192.5 pounds.   Echo was done today and reviewed, EF remains 25-30% with mild LV dilation.   He returns for followup of CHF.  He has been doing well generally.  He is mildly short of breath walking up a flight of stairs.  No problems walking on flat ground.  No orthopnea/PND.  No chest pain.  No palpitations.  No lightheadedness or syncope. Occasional dry cough.  He is taking all his meds.  Weight stable.   Labs (5/20): LDL 181 Labs (7/20): K 4.3, creatinine 1.4  ECG (personally reviewed, 7/20): NSR, LVH with repolarization (narrow QRS).   PMH: 1. Hyperlipidemia 2. HTN 3. Chronic systolic CHF: Nonischemic cardiomyopathy.  Echo (5/20) with EF 20-25%, normal RV.  - RHC/LHC (5/20): No significant coronary disease.  Mean RA 4, PA 29/17 mean 21, mean PCWP 10, CI 2.93.  - Cardiac MRI (5/20): Moderate LV dilation with diffuse, severe hypokinesis, EF 17%. Normal RV size and systolic function, EF 52%.Moderate MR and moderate TR.  Uninterpretable delayed enhancement images.  - Echo (9/20): EF 25-30%, mild LV dilation, moderate MR, moderate LAE.   ROS: All systems negative except as listed in HPI, PMH and Problem List.  SH:  Social History   Socioeconomic History  . Marital status: Married    Spouse name: Not on file  . Number of children: Not on file  . Years of education: Not on file  . Highest education level: Not on file  Occupational  History  . Not on file  Social Needs  . Financial resource strain: Not on file  . Food insecurity    Worry: Not on file    Inability: Not on file  . Transportation needs    Medical: Not on file    Non-medical: Not on file  Tobacco Use  . Smoking status: Never Smoker  . Smokeless tobacco: Never Used  . 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 Alcohol use: No  . Drug use: No  . Sexual activity: Yes  Lifestyle  . Physical activity    Days per week: Not on file    Minutes per session: Not on file  . Stress: Not on file  Relationships  . Social Herbalist on phone: Not on file    Gets together: Not on file    Attends religious service: Not on file    Active member of club or organization: Not on file    Attends meetings of clubs or organizations: Not on file    Relationship status: Not on file  . Intimate partner violence    Fear of current or ex partner: Not on file    Emotionally abused: Not on file    Physically abused: Not on file    Forced sexual activity: Not on file  Other Topics Concern  . Not on file  Social History Narrative  . Not on  file    FH:  Family History  Problem Relation Age of Onset  . Hypertension Paternal Grandfather      Current Outpatient Medications  Medication Sig Dispense Refill  . Ascorbic Acid (VITAMIN C PO) Take 1 tablet by mouth daily.    . ASPIRIN ADULT LOW STRENGTH 81 MG EC tablet TAKE 1 TABLET BY MOUTH DAILY FOR 30 DAYS. 30 tablet 0  . aspirin-acetaminophen-caffeine (EXCEDRIN MIGRAINE) 250-250-65 MG per tablet Take 2 tablets by mouth every 6 (six) hours as needed for headache or migraine.     Marland Kitchen atorvastatin (LIPITOR) 40 MG tablet TAKE 1 TABLET (40 MG TOTAL) BY MOUTH DAILY AT 6 PM. 30 tablet 0  . carvedilol (COREG) 12.5 MG tablet Take 1 tablet (12.5 mg total) by mouth 2 (two) times daily with a meal. 60 tablet 6  . Cholecalciferol (VITAMIN D-3 PO) Take 1  capsule by mouth 2 (two) times daily after a meal.    . furosemide (LASIX) 40 MG tablet Take 1 tablet (40 mg total) by mouth daily. 90 tablet 3  . sacubitril-valsartan (ENTRESTO) 97-103 MG Take 1 tablet by mouth 2 (two) times daily. 180 tablet 3  . spironolactone (ALDACTONE) 25 MG tablet Take 1 tablet (25 mg total) by mouth daily. 30 tablet 5   No current facility-administered medications for this encounter.     Vitals:   06/17/19 1057  BP: 110/86  Pulse: 85  SpO2: 99%  Weight: 94.2 kg (207 lb 9.6 oz)   Wt Readings from Last 3 Encounters:  06/17/19 94.2 kg (207 lb 9.6 oz)  05/04/19 93.6 kg (206 lb 6.4 oz)  04/15/19 93.5 kg (206 lb 3.2 oz)    PHYSICAL EXAM: General: NAD Neck: No JVD, no thyromegaly or thyroid nodule.  Lungs: Clear to auscultation bilaterally with normal respiratory effort. CV: Nondisplaced PMI.  Heart regular S1/S2, no S3/S4, no murmur.  No peripheral edema.  No carotid bruit.  Normal pedal pulses.  Abdomen: Soft, nontender, no hepatosplenomegaly, no distention.  Skin: Intact without lesions or rashes.  Neurologic: Alert and oriented x 3.  Psych: Normal affect. Extremities: No clubbing or cyanosis.  HEENT: Normal.   ASSESSMENT & PLAN: 1. Chronic systolic CHF:  Nonischemic cardiomyopathy.  02/22/2019 Echo with EF 20-25%, diffuse hypokinesis, normal RV, moderate MR. Cause uncertain. Does have history of HTN. Cath showed no significant coronary disease and preserved cardiac output. Uncle has CHF, no other family members. No ETOH/drugs. TSH was ok. No definite pre-existing URI/viral symptoms. Cardiac MRI in 5/20 showed LVEF 17%, RV normal. Unfortunately, delayed enhancement images were uninterpretable.  Echo was done today and reviewed, EF remains 25-30%.  NYHA class II symptoms.  He is not volume overloaded on exam.  - Continue lasix 40 mg daily, BMET today. - Increase Coreg to 12.5 mg bid.   - Continue Entresto 97-103 twice a day. - Continue spironolactone 25 mg   daily. - EF remains low by echo.  He has a nonischemic cardiomyopathy but is young at 49 with persistently low EF.  I will refer to EP for evaluation for ICD.  - I will arrange for CPX for objective measure of functional capacity.  2. HTN: Controlled. Much improved. Continue current regimen.   3. Hyperlipidemia: Very high LDL, continue atorvastatin.  - Check lipids next visit.   Followup in 3 wks in pharmacy clinic for medication titration (?Bidil or empagliflozin).  See me in 6 wks.   Marca Ancona 06/17/2019

## 2019-06-19 ENCOUNTER — Telehealth (HOSPITAL_COMMUNITY): Payer: Self-pay

## 2019-06-19 DIAGNOSIS — I5022 Chronic systolic (congestive) heart failure: Secondary | ICD-10-CM

## 2019-06-19 MED ORDER — FUROSEMIDE 20 MG PO TABS: 20.0000 mg | ORAL_TABLET | Freq: Every day | ORAL | 3 refills | Status: DC

## 2019-06-19 NOTE — Telephone Encounter (Signed)
PATIENT RETURNED CALL AND AWARE OF RESULTS  REPEAT LABS 9/17

## 2019-06-19 NOTE — Addendum Note (Signed)
Addended by: Kerry Dory on: 06/19/2019 03:27 PM   Modules accepted: Orders

## 2019-06-19 NOTE — Telephone Encounter (Signed)
-----   Message from Larey Dresser, MD sent at 06/17/2019  1:41 PM EDT ----- Hold Lasix for 2 days then decrease to 20 mg daily.  BMET 5 days.

## 2019-06-19 NOTE — Telephone Encounter (Signed)
Called patient again, LM for return call regarding lab results. Letter sent to patient as office staff attempted x4 to reach patient unsuccessfully.

## 2019-06-25 ENCOUNTER — Ambulatory Visit (HOSPITAL_COMMUNITY)
Admission: RE | Admit: 2019-06-25 | Discharge: 2019-06-25 | Disposition: A | Discharge status: Home / Self Care | Payer: Self-pay | Source: Ambulatory Visit | Attending: Cardiology | Admitting: Cardiology

## 2019-06-25 ENCOUNTER — Other Ambulatory Visit: Payer: Self-pay

## 2019-06-25 DIAGNOSIS — I5022 Chronic systolic (congestive) heart failure: Secondary | ICD-10-CM | POA: Insufficient documentation

## 2019-06-25 LAB — BASIC METABOLIC PANEL
Anion gap: 10 (ref 5–15)
BUN: 26 mg/dL — ABNORMAL HIGH (ref 6–20)
CO2: 20 mmol/L — ABNORMAL LOW (ref 22–32)
Calcium: 9.6 mg/dL (ref 8.9–10.3)
Chloride: 110 mmol/L (ref 98–111)
Creatinine, Ser: 1.34 mg/dL — ABNORMAL HIGH (ref 0.61–1.24)
GFR calc Af Amer: >60 mL/min (ref >60)
GFR calc non Af Amer: >60 mL/min (ref >60)
Glucose, Bld: 108 mg/dL — ABNORMAL HIGH (ref 70–99)
Potassium: 4 mmol/L (ref 3.5–5.1)
Sodium: 140 mmol/L (ref 135–145)

## 2019-07-02 ENCOUNTER — Telehealth (HOSPITAL_COMMUNITY): Payer: Self-pay

## 2019-07-02 NOTE — Telephone Encounter (Signed)
Attempted to contact PT to schedule CPX.  Left v/m on listed home number and mobile number asking PT to contact our office to schedule CPX.

## 2019-07-06 ENCOUNTER — Ambulatory Visit (HOSPITAL_COMMUNITY)
Admission: RE | Admit: 2019-07-06 | Discharge: 2019-07-06 | Disposition: A | Discharge status: Home / Self Care | Payer: Self-pay | Source: Ambulatory Visit | Attending: Cardiology | Admitting: Cardiology

## 2019-07-06 ENCOUNTER — Other Ambulatory Visit: Payer: Self-pay

## 2019-07-06 ENCOUNTER — Other Ambulatory Visit (HOSPITAL_COMMUNITY): Payer: Self-pay | Admitting: Adult Health

## 2019-07-06 VITALS — BP 138/102 | HR 78 | Wt 212.4 lb

## 2019-07-06 DIAGNOSIS — I5022 Chronic systolic (congestive) heart failure: Secondary | ICD-10-CM | POA: Insufficient documentation

## 2019-07-06 LAB — BASIC METABOLIC PANEL
Anion gap: 11 (ref 5–15)
BUN: 13 mg/dL (ref 6–20)
CO2: 21 mmol/L — ABNORMAL LOW (ref 22–32)
Calcium: 9.2 mg/dL (ref 8.9–10.3)
Chloride: 108 mmol/L (ref 98–111)
Creatinine, Ser: 1.29 mg/dL — ABNORMAL HIGH (ref 0.61–1.24)
GFR calc Af Amer: >60 mL/min (ref >60)
GFR calc non Af Amer: >60 mL/min (ref >60)
Glucose, Bld: 108 mg/dL — ABNORMAL HIGH (ref 70–99)
Potassium: 3.7 mmol/L (ref 3.5–5.1)
Sodium: 140 mmol/L (ref 135–145)

## 2019-07-06 MED ORDER — FUROSEMIDE 40 MG PO TABS: 40.0000 mg | ORAL_TABLET | Freq: Every day | ORAL | 6 refills | Status: DC

## 2019-07-06 MED ORDER — ATORVASTATIN CALCIUM 40 MG PO TABS: 40.0000 mg | ORAL_TABLET | Freq: Every day | ORAL | 11 refills | Status: DC

## 2019-07-06 MED ORDER — SPIRONOLACTONE 25 MG PO TABS: 25.0000 mg | ORAL_TABLET | Freq: Every day | ORAL | 11 refills | Status: DC

## 2019-07-06 MED FILL — ATORVASTATIN 40 MG TABLET: 40 | 30 days supply | Qty: 30 | Fill #0

## 2019-07-06 MED FILL — SPIRONOLACTONE 25 MG TABLET: 25 | 30 days supply | Qty: 30 | Fill #3

## 2019-07-06 MED FILL — CARVEDILOL 12.5 MG TABLET: 12.5 | 30 days supply | Qty: 60 | Fill #1

## 2019-07-06 NOTE — Patient Instructions (Addendum)
It was a pleasure seeing you today!  MEDICATIONS: -We are changing your medications today -Increase furosemide back to 40 mg (1 tab) daily  -Bring in [place] for manufacturer's assistance application. -Call if you have questions about your medications.  LABS: -We will call you if your labs need attention.  NEXT APPOINTMENT: Return to clinic in 1 week with Pharmacy Clinic for lab check.  In general, to take care of your heart failure: -Limit your fluid intake to 2 Liters (half-gallon) per day.   -Limit your salt intake to ideally 2-3 grams (2000-3000 mg) per day. -Weigh yourself daily and record, and bring that "weight diary" to your next appointment.  (Weight gain of 2-3 pounds in 1 day typically means fluid weight.) -The medications for your heart are to help your heart and help you live longer.   -Please contact us before stopping any of your heart medications.  Call the clinic at [phone] with questions or to reschedule future appointments.

## 2019-07-13 ENCOUNTER — Ambulatory Visit (HOSPITAL_COMMUNITY)
Admission: RE | Admit: 2019-07-13 | Discharge: 2019-07-13 | Disposition: A | Discharge status: Home / Self Care | Payer: Self-pay | Source: Ambulatory Visit | Attending: Cardiology | Admitting: Cardiology

## 2019-07-13 ENCOUNTER — Other Ambulatory Visit: Payer: Self-pay

## 2019-07-13 ENCOUNTER — Telehealth (HOSPITAL_COMMUNITY): Payer: Self-pay | Admitting: *Deleted

## 2019-07-13 VITALS — BP 122/84 | HR 75 | Wt 208.2 lb

## 2019-07-13 DIAGNOSIS — I110 Hypertensive heart disease with heart failure: Secondary | ICD-10-CM | POA: Insufficient documentation

## 2019-07-13 DIAGNOSIS — E785 Hyperlipidemia, unspecified: Secondary | ICD-10-CM | POA: Insufficient documentation

## 2019-07-13 DIAGNOSIS — I5022 Chronic systolic (congestive) heart failure: Secondary | ICD-10-CM | POA: Insufficient documentation

## 2019-07-13 DIAGNOSIS — Z79899 Other long term (current) drug therapy: Secondary | ICD-10-CM | POA: Insufficient documentation

## 2019-07-13 LAB — BASIC METABOLIC PANEL
Anion gap: 11 (ref 5–15)
BUN: 17 mg/dL (ref 6–20)
CO2: 24 mmol/L (ref 22–32)
Calcium: 9.3 mg/dL (ref 8.9–10.3)
Chloride: 107 mmol/L (ref 98–111)
Creatinine, Ser: 1.42 mg/dL — ABNORMAL HIGH (ref 0.61–1.24)
GFR calc Af Amer: >60 mL/min (ref >60)
GFR calc non Af Amer: 57 mL/min — ABNORMAL LOW (ref >60)
Glucose, Bld: 117 mg/dL — ABNORMAL HIGH (ref 70–99)
Potassium: 4 mmol/L (ref 3.5–5.1)
Sodium: 142 mmol/L (ref 135–145)

## 2019-07-13 MED ORDER — HYDRALAZINE HCL 25 MG PO TABS: 25.0000 mg | ORAL_TABLET | Freq: Three times a day (TID) | ORAL | 3 refills | Status: DC

## 2019-07-13 MED ORDER — ISOSORBIDE MONONITRATE ER 30 MG PO TB24: 30.0000 mg | ORAL_TABLET | Freq: Every day | ORAL | 3 refills | Status: DC

## 2019-07-13 MED FILL — hydrALAZINE HCL 25 MG TABS: 25 | 30 days supply | Qty: 90 | Fill #0

## 2019-07-13 MED FILL — ISOSORBIDE MN ER 30 MG TAB: 30 | 30 days supply | Qty: 30 | Fill #0

## 2019-07-13 NOTE — Progress Notes (Signed)
PCP: Patient, [name] Primary Cardiologist: Dr Shirlee Latch   HPI:  Nathaniel Carris a 52 y.o.malewith a history of HTN, HLD, and anemia. No known prior cardiac history. He had an uncle with heart failure.   Admitted 02/21/2019 with dyspnea and cough. CTA negative for PE, COVID-19 negative. Diagnosed with CHF, diuresed with IV lasix. Echo showed newly reduced EF 20-25%, so HF team was consulted.He underwent RHC/LHC that showed preserved cardiac output, no CAD, and optimized filling pressures post-diuresis. HF meds started. Discharge weight 192.5 pounds.   Echo 06/17/2019 showed EF 25-30% with mild LV dilation.   Recently presented to HF Clinic for follow up with Dr. Shirlee Latch. He reported doing well overall, only becoming mildly short of breath walking up a flight of stairs.  No problems walking on flat ground.  No orthopnea/PND.  No chest pain.  No palpitations.  No lightheadedness or syncope. Occasional dry cough.  He reported taking all his meds.  Weight stable.    Today he returns to HF clinic for pharmacist medication titration. At last visit with MD, carvedilol was increased to 12.5 mg BID. His furosemide was held for two days and then decreased to 20 mg daily due to Scr increase. Overall he is feeling well today. No dizziness, lightheadedness, fatigue or syncope. No complaints of SOB or DOE, and has been walking 5-10 miles every other day with his wife. He still complains of a dry cough, which occurs daily but comes and goes throughout the day. He notes his weight has been increasing over the last week, since furosemide was decreased to 20 mg daily. His weight is normally <200 lbs, but has been >205, which is abnormal for him. He has trace edema in the lower extremities, stable 2 pillow orthopnea and no PND. JVD 3 cm above the clavicle on exam. ReDS vest 44% in clinic today. He reports his appetite "comes and goes" and that he feels less hungry when his weight is up. He does adhere to a  low salt diet (his wife cooks).    . Shortness of breath/dyspnea on exertion? no  . Orthopnea/PND? no . Edema? Trace LEE, JVD 3 cm above the clavicle at 90 degrees.  . Lightheadedness/dizziness? no . Daily weights at home? Usually but scale is broken currently (will pick one up tomorrow). Weights have been closer to 210 lbs recently, but his dry weight is ~200 lbs.  . Blood pressure/heart rate monitoring at home? no . Following low-sodium/fluid-restricted diet? Yes - his wife cooks and just bought a low-salt cookbook.   HF Medications: Carvedilol 12.5 mg twice daily Entresto 97/103 mg twice daily Spironolactone 25 mg daily Furosemide 20 mg daily  Has the patient been experiencing any side effects to the medications prescribed?  no  Does the patient have any problems obtaining medications due to transportation or finances?   Yes - no prescription insurance. Obtains prescriptions through [facility]. Obtains Entresto through [employer].   Understanding of regimen: poor - his wife manages his medications Understanding of indications: fair Potential of compliance: good Patient understands to avoid NSAIDs. Patient understands to avoid decongestants.    Pertinent Lab Values 07/06/19:  . Serum creatinine 129, BUN 13, Potassium 3.7, Sodium 140, BNP 999.7 (03/23/19)  Vital Signs: . Weight: 212.4 lbs (last clinic weight: 207) . Blood pressure: 138/102  . Heart rate: 98   Assessment: 1. Chronic systolic CHF:  Nonischemic cardiomyopathy.  02/22/2019 Echo with EF 20-25%, diffuse hypokinesis, normal RV, moderate  MR. Cause uncertain. Does have history of HTN. Cath showed no significant coronary disease and preserved cardiac output. Uncle has CHF, no other family members. No ETOH/drugs. TSH was ok. No definite pre-existing URI/viral symptoms. Cardiac MRI in 5/20 showed LVEF 17%, RV normal. Unfortunately, delayed enhancement images were uninterpretable.  Echo 06/17/19  showed EF remains 25-30%.   -NYHA class II symptoms.  Hypervolemic on exam (ReDS 44%, JVD 3 cm above the clavicle). -BP elevated at 138/102, HR 78 -Labs reviewed in clinic today: Scr normal at 1.29, K 3.7 - Increase furosemide back to 40 mg daily. Repeat BMET in 1 week. - Continue carvedilol 12.5 mg BID.   - Continue Entresto 97-103 BID. Obtains through [employer]. - Continue spironolactone 25 mg daily. - Plan to start BiDil at next visit. Working on Surveyor, quantity.  - EF remains low by echo.  He has a nonischemic cardiomyopathy but is young at 29 with persistently low EF.  Dr. Aundra Dubin referred to EP for evaluation for ICD.  - CPX for objective measure of functional capacity.  2. HTN: Elevated today. Continue current regimen given recent Scr bump and elevated volume status on exam. Will plan to start BiDil at next visit.  3. Hyperlipidemia: Very high LDL, continue atorvastatin.  - Check lipids next visit.    Plan: 1) Medication changes: Based on clinical presentation, vital signs and recent labs will increase furosemide to 40 mg daily. Follow-up BMET in 1 week.  2) Labs: Scr 1.29, K 3.7 3) Follow-up: Pharmacy Clinic/BMET in 1 week.    Audry Riles, PharmD, BCPS, CPP Heart Failure Clinic Pharmacist [phone]  Patient PMH and assessment discussed in detail with Dr. Lynelle Smoke.  Agree with assessment and plan.   Bonnita Nasuti Pharm.D. CPP, BCPS Clinical Pharmacist [phone] 07/13/2019 12:18 PM

## 2019-07-13 NOTE — Telephone Encounter (Signed)
Left VM for pt to call back to schedule covid test for 10/23. cpx on 10/26

## 2019-07-13 NOTE — Patient Instructions (Signed)
It was a pleasure seeing you today!  MEDICATIONS: -We are changing your medications today -Start hydralazine 25 mg (1 tab) three times daily -Start isosorbide mononitrate (Imdur) 30 mg (1 tab) daily -I am sending your medications to [facility] ([place], [place], [place] [address]) -Call if you have questions about your medications.  LABS: -We will call you if your labs need attention.  NEXT APPOINTMENT: Return to clinic in 3 weeks with Dr. Aundra Dubin.  In general, to take care of your heart failure: -Limit your fluid intake to 2 Liters (half-gallon) per day.   -Limit your salt intake to ideally 2-3 grams (2000-3000 mg) per day. -Weigh yourself daily and record, and bring that "weight diary" to your next appointment.  (Weight gain of 2-3 pounds in 1 day typically means fluid weight.) -The medications for your heart are to help your heart and help you live longer.   -Please contact us before stopping any of your heart medications.  Call the clinic at [phone] with questions or to reschedule future appointments.

## 2019-07-16 NOTE — Progress Notes (Signed)
PCP: Patient, [name] Primary Cardiologist: Dr Aundra Dubin   HPI:  Nathaniel Carris a 53 y.o.malewith a history of HTN, HLD, and anemia. No known prior cardiac history. He had an uncle with heart failure.   Admitted 02/21/2019 with dyspnea and cough. CTA negative for PE, COVID-19 negative. Diagnosed with CHF, diuresed with IV lasix. Echo showed newly reduced EF 20-25%, so HF team was consulted.He underwent RHC/LHC that showed preserved cardiac output, no CAD, and optimized filling pressures post-diuresis. HF meds started. Discharge weight 192.5 pounds.   Echo 06/17/2019 showed EF 25-30% with mild LV dilation.   Recently presented to HF Clinic for pharmacist medication titration. At that visit, his furosemide was increased back to 40 mg daily due to weight increase and ReDS in clinic of 44%. Today he is feeling well overall. Denies dizziness, lightheadedness, chest pain or palpitations. No SOB/DOE and is still walking 5-10 miles every other day. His weight is down in clinic today and on his home scale. He is taking furosemide 40 mg daily, but has not taken his dose today yet. ReDS reading was 43% in clinic today. JVD 1 cm above the clavicle, which is decreased from last visit. No lower extremity edema or PND; orthopnea stable at 2 pillows. He does struggle with fluid restriction and we discussed limiting his fluid intake to 2 L per day. He feels his cough has improved since he has lost some of the fluid weight and that his breathing is better. His appetite is ok, still up and down, but following a salt-restricted diet.   . Shortness of breath/dyspnea on exertion? no  . Orthopnea/PND? no . Edema? No LEE, JVD 1 cm above the clavicle at 90 degrees.  . Lightheadedness/dizziness? no . Daily weights at home? Yes . Blood pressure/heart rate monitoring at home? no . Following low-sodium/fluid-restricted diet? Yes - his wife cooks and just bought a low-salt cookbook.   HF  Medications: Carvedilol 12.5 mg twice daily Entresto 97/103 mg twice daily Spironolactone 25 mg daily Furosemide 40 mg daily  Has the patient been experiencing any side effects to the medications prescribed?  no  Does the patient have any problems obtaining medications due to transportation or finances?   Yes - no prescription insurance. Obtains prescriptions through [facility] HF fund. Obtains Entresto through [employer].   Understanding of regimen: poor - his wife manages his medications Understanding of indications: fair Potential of compliance: good Patient understands to avoid NSAIDs. Patient understands to avoid decongestants.    Pertinent Lab Values 07/13/19:  . Serum creatinine 1.42, BUN 17, Potassium 4.0, Sodium 142, BNP 999.7 (03/23/19)  Vital Signs: . Weight: 208.2 lbs (last clinic weight: 212.4) . Blood pressure: 122/84 . Heart rate: 75   Assessment: 1. Chronic systolic CHF:  Nonischemic cardiomyopathy.  02/22/2019 Echo with EF 20-25%, diffuse hypokinesis, normal RV, moderate MR. Cause uncertain. Does have history of HTN. Cath showed no significant coronary disease and preserved cardiac output. Uncle has CHF, no other family members. No ETOH/drugs. TSH was ok. No definite pre-existing URI/viral symptoms. Cardiac MRI in 5/20 showed LVEF 17%, RV normal. Unfortunately, delayed enhancement images were uninterpretable.  Echo 06/17/19 showed EF remains 25-30%.   -NYHA class II symptoms.  Volume status improving, ReDS 43%, JVD 1 cm above the clavicle, weight down. Has not taken furosemide yet this AM.  -BP normal at 122/84, HR 75 -Labs reviewed in clinic today: Scr inc 1.29>>1.42 but is still within his normal range; K 4.0 -  Continue furosemide 40 mg daily. - Continue carvedilol 12.5 mg BID.   - Continue Entresto 97-103 BID. Obtains through [employer]. - Continue spironolactone 25 mg daily. - Start hydralazine 25 mg TID and Imdur 30  mg daily.   - EF remains low by echo.  He has a nonischemic cardiomyopathy but is young at 81 with persistently low EF.  Dr. Shirlee Latch referred to EP for evaluation for ICD.  - CPX for objective measure of functional capacity.  2. HTN: within normal limits.  -Start hydralazine/imdur and continue Entresto, carvedilol and spironolactone as above.  3. Hyperlipidemia: Very high LDL, continue atorvastatin.  - Check lipids next visit.    Plan: 1) Medication changes: Based on clinical presentation, vital signs and recent labs will Start hydralazine 25 mg TID and Imdur 30 mg daily. 2) Labs: Scr 1.42, K 4.0 - stable today 3) Follow-up: HF Clinic with Dr. Shirlee Latch in 3 weeks.    Karle Plumber, PharmD, BCPS, CPP Heart Failure Clinic Pharmacist [phone]   Patient PMH and assessment discussed with Dr. Marden Noble.  I agree with assessment and plan.  Leota Sauers Pharm.D. CPP, BCPS Clinical Pharmacist [phone] 07/16/2019 1:18 PM

## 2019-07-17 ENCOUNTER — Institutional Professional Consult (permissible substitution): Payer: Self-pay | Admitting: Internal Medicine

## 2019-07-22 ENCOUNTER — Telehealth (HOSPITAL_COMMUNITY): Payer: Self-pay

## 2019-07-22 NOTE — Telephone Encounter (Signed)
Pt left message stating that he needs to schedule covid screen prior to CPX on 08/13/19.  LM for patient to call back confirming if 11/2 date works for him.

## 2019-07-22 NOTE — Telephone Encounter (Signed)
Per Cyril Mourning, patient will have to be quarantined after covid screen.  message forwarded to [facility] to reschedule.

## 2019-07-22 NOTE — Telephone Encounter (Signed)
Pt called back, he notes that date of cpx will not work and need to reschedule. Pt also inquired about whether or not he would have to be quarantined after covid test. Message sent to [place] with information. Will f/u

## 2019-08-03 ENCOUNTER — Encounter (HOSPITAL_COMMUNITY): Payer: Self-pay | Admitting: Cardiology

## 2019-08-03 ENCOUNTER — Ambulatory Visit (HOSPITAL_COMMUNITY)
Admission: RE | Admit: 2019-08-03 | Discharge: 2019-08-03 | Disposition: A | Discharge status: Home / Self Care | Payer: Self-pay | Source: Ambulatory Visit | Attending: Cardiology | Admitting: Cardiology

## 2019-08-03 ENCOUNTER — Other Ambulatory Visit: Payer: Self-pay

## 2019-08-03 VITALS — BP 123/87 | HR 70 | Wt 203.2 lb

## 2019-08-03 DIAGNOSIS — R0602 Shortness of breath: Secondary | ICD-10-CM | POA: Insufficient documentation

## 2019-08-03 DIAGNOSIS — I110 Hypertensive heart disease with heart failure: Secondary | ICD-10-CM | POA: Insufficient documentation

## 2019-08-03 DIAGNOSIS — I5022 Chronic systolic (congestive) heart failure: Secondary | ICD-10-CM

## 2019-08-03 DIAGNOSIS — R05 Cough: Secondary | ICD-10-CM | POA: Insufficient documentation

## 2019-08-03 DIAGNOSIS — Z7982 Long term (current) use of aspirin: Secondary | ICD-10-CM | POA: Insufficient documentation

## 2019-08-03 DIAGNOSIS — E785 Hyperlipidemia, unspecified: Secondary | ICD-10-CM | POA: Insufficient documentation

## 2019-08-03 DIAGNOSIS — I10 Essential (primary) hypertension: Secondary | ICD-10-CM

## 2019-08-03 DIAGNOSIS — Z79899 Other long term (current) drug therapy: Secondary | ICD-10-CM | POA: Insufficient documentation

## 2019-08-03 DIAGNOSIS — I428 Other cardiomyopathies: Secondary | ICD-10-CM | POA: Insufficient documentation

## 2019-08-03 LAB — BASIC METABOLIC PANEL
Anion gap: 8 (ref 5–15)
BUN: 31 mg/dL — ABNORMAL HIGH (ref 6–20)
CO2: 23 mmol/L (ref 22–32)
Calcium: 9.1 mg/dL (ref 8.9–10.3)
Chloride: 107 mmol/L (ref 98–111)
Creatinine, Ser: 1.84 mg/dL — ABNORMAL HIGH (ref 0.61–1.24)
GFR calc Af Amer: 48 mL/min — ABNORMAL LOW (ref >60)
GFR calc non Af Amer: 42 mL/min — ABNORMAL LOW (ref >60)
Glucose, Bld: 115 mg/dL — ABNORMAL HIGH (ref 70–99)
Potassium: 4.4 mmol/L (ref 3.5–5.1)
Sodium: 138 mmol/L (ref 135–145)

## 2019-08-03 LAB — LIPID PANEL
Cholesterol: 145 mg/dL (ref 0–200)
HDL: 43 mg/dL (ref >40)
LDL Cholesterol: 90 mg/dL (ref 0–99)
Total CHOL/HDL Ratio: 3.4 RATIO
Triglycerides: 61 mg/dL (ref <150)
VLDL: 12 mg/dL (ref 0–40)

## 2019-08-03 MED ORDER — ISOSORBIDE MONONITRATE ER 60 MG PO TB24: 60.0000 mg | ORAL_TABLET | Freq: Every day | ORAL | 3 refills | Status: DC

## 2019-08-03 MED ORDER — ASPIRIN 81 MG PO TBEC: DELAYED_RELEASE_TABLET | ORAL | 5 refills | Status: DC

## 2019-08-03 MED ORDER — HYDRALAZINE HCL 50 MG PO TABS: 50.0000 mg | ORAL_TABLET | Freq: Three times a day (TID) | ORAL | 3 refills | Status: DC

## 2019-08-03 MED FILL — ASPIRIN 81MG ADULT LOW STRE: 81 | 30 days supply | Qty: 30 | Fill #0

## 2019-08-03 MED FILL — FUROSEMIDE 40 MG TAB: 40 | 30 days supply | Qty: 30 | Fill #3

## 2019-08-03 MED FILL — CARVEDILOL 12.5 MG TABLET: 12.5 | 30 days supply | Qty: 60 | Fill #2

## 2019-08-03 MED FILL — ISOSORBIDE MN ER 60 MG TAB: 60 | 30 days supply | Qty: 30 | Fill #0

## 2019-08-03 MED FILL — SPIRONOLACTONE 25 MG TABS: 25 | 30 days supply | Qty: 30 | Fill #4

## 2019-08-03 MED FILL — ATORVASTATIN 40 MG TABLET: 40 | 30 days supply | Qty: 30 | Fill #1

## 2019-08-03 MED FILL — hydrALAZINE HCL 50 MG TABS: 50 | 30 days supply | Qty: 90 | Fill #0

## 2019-08-03 NOTE — Patient Instructions (Addendum)
INCREASE Hydralazine 50mg  (1 tab) three times a day  INCREASE Imdur 60mg  (1 tab) daily  Labs today We will only contact you if something comes back abnormal or we need to make some changes. Otherwise no news is good news!  Your physician has requested that you have an echocardiogram. Echocardiography is a painless test that uses sound waves to create images of your heart. It provides your doctor with information about the size and shape of your heart and how well your heart's chambers and valves are working. This procedure takes approximately one hour. There are no restrictions for this procedure.  You have been referred to Electrophysiology for consult for an ICD. Your physician has recommended that you have a defibrillator inserted. An implantable cardioverter defibrillator (ICD) is a small device that is placed in your chest or, in rare cases, your abdomen. This device uses electrical pulses or shocks to help control life-threatening, irregular heartbeats that could lead the heart to suddenly stop beating (sudden cardiac arrest). Leads are attached to the ICD that goes into your heart. This is done in the hospital and usually requires an overnight stay. Please see the instruction sheet given to you today for more information.   Your physician has recommended that you have a cardiopulmonary stress test (CPX). CPX testing is a non-invasive measurement of heart and lung function. It replaces a traditional treadmill stress test. This type of test provides a tremendous amount of information that relates not only to your present condition but also for future outcomes. This test combines measurements of you ventilation, respiratory gas exchange in the lungs, electrocardiogram (EKG), blood pressure and physical response before, during, and following an exercise protocol.  Your physician recommends that you schedule a follow-up appointment in: 3 months with Dr Aundra Dubin

## 2019-08-03 NOTE — Progress Notes (Signed)
PCP: Patient, [name] Primary Cardiologist: Dr Shirlee Latch   HPI: Nathaniel Jarret. is a 53 y.o. male with a history of HTN, HLD, and anemia. No known prior cardiac history. He had an uncle with heart failure.   Admitted 02/21/2019 with dyspnea and cough. CTA negative for PE, COVID-19 negative. Diagnosed with CHF, diuresed with IV lasix. Echo showed newly reduced EF 20-25%, so HF team was consulted. He underwent RHC/LHC that showed preserved cardiac output, no CAD, and optimized filling pressures post-diuresis. HF meds started. Discharge weight 192.5 pounds.   Echo in 9/20 showed EF remains 25-30% with mild LV dilation.   He returns for followup of CHF.  He is doing very well, no dyspnea walking on flat ground.  He is short of breath carrying packages up stairs.  He walks 5-10 miles walking every other day.  No orthopnea/PND.  Occasional cough.  No chest pain.  He is very active, working 2 jobs.  Weight is down 4 lbs.   Labs (5/20): LDL 181 Labs (7/20): K 4.3, creatinine 1.4 Labs (10/20): K 4, creatinine 1.42  PMH: 1. Hyperlipidemia 2. HTN 3. Chronic systolic CHF: Nonischemic cardiomyopathy.  Echo (5/20) with EF 20-25%, normal RV.  - RHC/LHC (5/20): No significant coronary disease.  Mean RA 4, PA 29/17 mean 21, mean PCWP 10, CI 2.93.  - Cardiac MRI (5/20): Moderate LV dilation with diffuse, severe hypokinesis, EF 17%. Normal RV size and systolic function, EF 46%.Moderate MR and moderate TR.  Uninterpretable delayed enhancement images.  - Echo (9/20): EF 25-30%, mild LV dilation, moderate MR, moderate LAE.   ROS: All systems negative except as listed in HPI, PMH and Problem List.  SH:  Social History   Socioeconomic History  . Marital status: Married    Spouse name: Not on file  . Number of children: Not on file  . Years of education: Not on file  . Highest education level: Not on file  Occupational History  . Not on file  Social Needs  . Financial resource strain: Not on  file  . Food insecurity    Worry: Not on file    Inability: Not on file  . Transportation needs    Medical: Not on file    Non-medical: Not on file  Tobacco Use  . Smoking status: Never Smoker  . Smokeless tobacco: Never Used  . 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 Alcohol use: No  . Drug use: No  . Sexual activity: Yes  Lifestyle  . Physical activity    Days per week: Not on file    Minutes per session: Not on file  . Stress: Not on file  Relationships  . Social Musician on phone: Not on file    Gets together: Not on file    Attends religious service: Not on file    Active member of club or organization: Not on file    Attends meetings of clubs or organizations: Not on file    Relationship status: Not on file  . Intimate partner violence    Fear of current or ex partner: Not on file    Emotionally abused: Not on file    Physically abused: Not on file    Forced sexual activity: Not on file  Other Topics Concern  . Not on file  Social History Narrative  . Not on file    FH:  Family History  Problem Relation Age of Onset  . Hypertension Paternal Grandfather      Current Outpatient Medications  Medication Sig Dispense Refill  . Ascorbic Acid (VITAMIN C PO) Take 1 tablet by mouth daily.    Marland Kitchen aspirin (ASPIRIN ADULT LOW STRENGTH) 81 MG EC tablet TAKE 1 TABLET BY MOUTH DAILY 30 tablet 5  . aspirin-acetaminophen-caffeine (EXCEDRIN MIGRAINE) 250-250-65 MG per tablet Take 2 tablets by mouth every 6 (six) hours as needed for headache or migraine.     Marland Kitchen atorvastatin (LIPITOR) 40 MG tablet Take 1 tablet (40 mg total) by mouth daily. 30 tablet 11  . carvedilol (COREG) 12.5 MG tablet Take 1 tablet (12.5 mg total) by mouth 2 (two) times daily with a meal. 60 tablet 6  . Cholecalciferol (VITAMIN D-3 PO) Take 1 capsule by mouth 4 (four) times a week.     . furosemide (LASIX) 40 MG tablet Take 1  tablet (40 mg total) by mouth daily. 30 tablet 6  . hydrALAZINE (APRESOLINE) 50 MG tablet Take 1 tablet (50 mg total) by mouth 3 (three) times daily. 90 tablet 3  . isosorbide mononitrate (IMDUR) 60 MG 24 hr tablet Take 1 tablet (60 mg total) by mouth daily. 30 tablet 3  . sacubitril-valsartan (ENTRESTO) 97-103 MG Take 1 tablet by mouth 2 (two) times daily. 180 tablet 3  . spironolactone (ALDACTONE) 25 MG tablet Take 1 tablet (25 mg total) by mouth daily. 30 tablet 11   No current facility-administered medications for this encounter.     Vitals:   08/03/19 0930  BP: 123/87  Pulse: 70  SpO2: 100%  Weight: 92.2 kg (203 lb 3.2 oz)   Wt Readings from Last 3 Encounters:  08/03/19 92.2 kg (203 lb 3.2 oz)  07/13/19 94.4 kg (208 lb 3.2 oz)  07/06/19 96.3 kg (212 lb 6.4 oz)    PHYSICAL EXAM: General: NAD Neck: No JVD, no thyromegaly or thyroid nodule.  Lungs: Clear to auscultation bilaterally with normal respiratory effort. CV: Nondisplaced PMI.  Heart regular S1/S2, no S3/S4, no murmur.  No peripheral edema.  No carotid bruit.  Normal pedal pulses.  Abdomen: Soft, nontender, no hepatosplenomegaly, no distention.  Skin: Intact without lesions or rashes.  Neurologic: Alert and oriented x 3.  Psych: Normal affect. Extremities: No clubbing or cyanosis.  HEENT: Normal.   ASSESSMENT & PLAN: 1. Chronic systolic CHF:  Nonischemic cardiomyopathy.  02/22/2019 Echo with EF 20-25%, diffuse hypokinesis, normal RV, moderate MR. Cause uncertain. Does have history of HTN. Cath showed no significant coronary disease and preserved cardiac output. Uncle has CHF, no other family members. No ETOH/drugs. TSH was ok. No definite pre-existing URI/viral symptoms. Cardiac MRI in 5/20 showed LVEF 17%, RV normal. Unfortunately, delayed enhancement images were uninterpretable.  Echo in 9/20 showed EF 25-30%.  NYHA class II symptoms.  He is not volume overloaded on exam.  - Continue lasix 40 mg daily, recent BMET  stable.  - Continue Coreg 12.5 mg bid.   - Continue Entresto 97-103 twice a day. - Continue spironolactone 25 mg daily. - Increase hydralazine to 50 mg tid and Imdur to 60 mg daily.  - I will have him repeat a limited echo in 11/20, 6 months after initial echo.  If this shows decreased EF, will plan for ICD placement (not CRT candidate).  - I will arrange for CPX for objective measure of functional capacity.  2. HTN: Controlled. Much improved. Continue current regimen.   3. Hyperlipidemia: Very high LDL in the past,  continue atorvastatin.  - Check lipids today.   Followup in 3 months.   Loralie Champagne 08/03/2019

## 2019-08-10 ENCOUNTER — Telehealth (HOSPITAL_COMMUNITY): Payer: Self-pay | Admitting: *Deleted

## 2019-08-10 ENCOUNTER — Encounter (HOSPITAL_COMMUNITY): Payer: Self-pay

## 2019-08-10 MED ORDER — FUROSEMIDE 20 MG PO TABS: 20.0000 mg | ORAL_TABLET | Freq: Every day | ORAL | 3 refills | Status: DC

## 2019-08-10 MED FILL — FUROSEMIDE 20 MG TABS: 20 | 30 days supply | Qty: 30 | Fill #0

## 2019-08-10 NOTE — Telephone Encounter (Signed)
Pt returned call about lab results. Reviewed labs with pt he is aware and agreeable with plan.

## 2019-08-13 ENCOUNTER — Encounter (HOSPITAL_COMMUNITY): Payer: Self-pay

## 2019-08-17 ENCOUNTER — Other Ambulatory Visit: Payer: Self-pay

## 2019-08-17 ENCOUNTER — Ambulatory Visit (HOSPITAL_COMMUNITY)
Admission: RE | Admit: 2019-08-17 | Discharge: 2019-08-17 | Disposition: A | Discharge status: Home / Self Care | Payer: Self-pay | Source: Ambulatory Visit | Attending: Cardiology | Admitting: Cardiology

## 2019-08-17 DIAGNOSIS — R7989 Other specified abnormal findings of blood chemistry: Secondary | ICD-10-CM | POA: Insufficient documentation

## 2019-08-17 DIAGNOSIS — E785 Hyperlipidemia, unspecified: Secondary | ICD-10-CM | POA: Insufficient documentation

## 2019-08-17 DIAGNOSIS — I083 Combined rheumatic disorders of mitral, aortic and tricuspid valves: Secondary | ICD-10-CM | POA: Insufficient documentation

## 2019-08-17 DIAGNOSIS — Z87891 Personal history of nicotine dependence: Secondary | ICD-10-CM | POA: Insufficient documentation

## 2019-08-17 DIAGNOSIS — I110 Hypertensive heart disease with heart failure: Secondary | ICD-10-CM | POA: Insufficient documentation

## 2019-08-17 DIAGNOSIS — I5022 Chronic systolic (congestive) heart failure: Secondary | ICD-10-CM | POA: Insufficient documentation

## 2019-08-17 NOTE — Progress Notes (Signed)
  Echocardiogram 2D Echocardiogram has been performed.  Nathaniel Carr 08/17/2019, 8:57 AM

## 2019-08-23 NOTE — Progress Notes (Deleted)
ELECTROPHYSIOLOGY CONSULT NOTE  Patient ID: Nathaniel Dault., MRN: [id number], DOB/AGE: 1966-01-31 53 y.o. Admit date: (Not on file) Date of Consult: 08/23/2019  Primary Physician: Patient, [name] Primary Cardiologist: *** Zackerie Sara. is a 53 y.o. male who is being seen today for the evaluation of *** at the request of ***.   Chief Complaint: ***   HPI Nathaniel Scarlata. is a 53 y.o. male ***    DATE TEST EF   5/20 Echo  25%   5/20 LHC   No CAD  5./20 cMRI 17% LGE Noninterpretable  9/20 Echo  25-30%          3. Chronic systolic CHF: Nonischemic cardiomyopathy.  Echo (5/20) with EF 20-25%, normal RV.  - RHC/LHC (5/20): No significant coronary disease.  Mean RA 4, PA 29/17 mean 21, mean PCWP 10, CI 2.93.  - Cardiac MRI (5/20): Moderate LV dilation with diffuse, severe hypokinesis, EF 17%. Normal RV size and systolic function, EF 46%.Moderate MR and moderate TR.  Uninterpretable delayed enhancement images.  - Echo (9/20): EF 25-30%, mild LV dilation, moderate MR, moderate LAE.   Past Medical History:  Diagnosis Date  . ANEMIA-NOS 12/05/2009  . DYSLIPIDEMIA 01/09/2010  . Headache(784.0) 12/05/2009  . Hemorrhoids   . HYPERTENSION 12/05/2009      Surgical History:  Past Surgical History:  Procedure Laterality Date  . HEMORRHOID SURGERY    . RIGHT/LEFT HEART CATH AND CORONARY ANGIOGRAPHY N/A 02/24/2019   Procedure: RIGHT/LEFT HEART CATH AND CORONARY ANGIOGRAPHY;  Surgeon: Laurey Morale, MD;  Location: [facility] INVASIVE CV LAB;  Service: Cardiovascular;  Laterality: N/A;     Home Meds: Prior to Admission medications   Medication Sig Start Date End Date Taking? Authorizing Provider  Ascorbic Acid (VITAMIN C PO) Take 1 tablet by mouth daily.    [provider]  aspirin (ASPIRIN ADULT LOW STRENGTH) 81 MG EC tablet TAKE 1 TABLET BY MOUTH DAILY 08/03/19   Laurey Morale, MD  aspirin-acetaminophen-caffeine [facility] MIGRAINE) [phone] MG per  tablet Take 2 tablets by mouth every 6 (six) hours as needed for headache or migraine.     [provider]  atorvastatin (LIPITOR) 40 MG tablet Take 1 tablet (40 mg total) by mouth daily. 07/06/19   Laurey Morale, MD  carvedilol (COREG) 12.5 MG tablet Take 1 tablet (12.5 mg total) by mouth 2 (two) times daily with a meal. 06/17/19   Laurey Morale, MD  Cholecalciferol (VITAMIN D-3 PO) Take 1 capsule by mouth 4 (four) times a week.     [provider]  furosemide (LASIX) 20 MG tablet Take 1 tablet (20 mg total) by mouth daily. 08/10/19   Laurey Morale, MD  hydrALAZINE (APRESOLINE) 50 MG tablet Take 1 tablet (50 mg total) by mouth 3 (three) times daily. 08/03/19   Laurey Morale, MD  isosorbide mononitrate (IMDUR) 60 MG 24 hr tablet Take 1 tablet (60 mg total) by mouth daily. 08/03/19   Laurey Morale, MD  sacubitril-valsartan (ENTRESTO) 97-103 MG Take 1 tablet by mouth 2 (two) times daily. 03/30/19   Bensimhon, Bevelyn Buckles, MD  spironolactone (ALDACTONE) 25 MG tablet Take 1 tablet (25 mg total) by mouth daily. 07/06/19   Laurey Morale, MD     Allergies:  Allergies  Allergen Reactions  . Shellfish-Derived Products Shortness Of Breath and Nausea And Vomiting    Can tolerate grilled shrimp  . Codeine Nausea Only  . Grass  Extracts [Gramineae Pollens] Nausea Only  . Penicillins Nausea Only    Has patient had a PCN reaction causing immediate rash, facial/tongue/throat swelling, SOB or lightheadedness with hypotension: No Has patient had a PCN reaction causing severe rash involving mucus membranes or skin necrosis: No Has patient had a PCN reaction that required hospitalization: Unk Has patient had a PCN reaction occurring within the last 10 years: From childhood If all of the above answers are "NO", then may proc    Social History   Socioeconomic History  . Marital status: Married    Spouse name: Not on file  . Number of children: Not on file  . Years of education:  Not on file  . Highest education level: Not on file  Occupational History  . Not on file  Social Needs  . Financial resource strain: Not on file  . Food insecurity    Worry: Not on file    Inability: Not on file  . Transportation needs    Medical: Not on file    Non-medical: Not on file  Tobacco Use  . Smoking status: Never Smoker  . Smokeless tobacco: Never Used  . 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 Alcohol use: No  . Drug use: No  . Sexual activity: Yes  Lifestyle  . Physical activity    Days per week: Not on file    Minutes per session: Not on file  . Stress: Not on file  Relationships  . Social Musician on phone: Not on file    Gets together: Not on file    Attends religious service: Not on file    Active member of club or organization: Not on file    Attends meetings of clubs or organizations: Not on file    Relationship status: Not on file  . Intimate partner violence    Fear of current or ex partner: Not on file    Emotionally abused: Not on file    Physically abused: Not on file    Forced sexual activity: Not on file  Other Topics Concern  . Not on file  Social History Narrative  . Not on file     Family History  Problem Relation Age of Onset  . Hypertension Paternal Grandfather      ROS:  Please see the history of present illness.   {ros master:[id number]}  All other systems reviewed and negative.    Physical Exam:*** There were no vitals taken for this visit. General: Well developed, well nourished male in no acute distress. Head: Normocephalic, atraumatic, sclera non-icteric, no xanthomas, nares are without discharge. EENT: normal Lymph Nodes:  none Back: without scoliosis/kyphosis***, no CVA tendersness Neck: Negative for carotid bruits. JVD not elevated. Lungs: Clear bilaterally to auscultation without wheezes, rales, or rhonchi. Breathing is unlabored. Heart:  RRR with S1 S2. No*** ***/6 systolic*** murmur , rubs, or gallops appreciated. Abdomen: Soft, non-tender, non-distended with normoactive bowel sounds. No hepatomegaly. No rebound/guarding. No obvious abdominal masses. Msk:  Strength and tone appear normal for age. Extremities: No clubbing or cyanosis. No*** ***+*** edema.  Distal pedal pulses are 2+ and equal bilaterally. Skin: Warm and Dry Neuro: Alert and oriented X 3. CN III-XII intact Grossly normal sensory and motor function . Psych:  Responds to questions appropriately with a normal affect.      Labs: Cardiac Enzymes No results for input(s): CKTOTAL, CKMB, TROPONINI in the last 72  hours. CBC Lab Results  Component Value Date   WBC 6.9 02/23/2019   HGB 17.0 02/24/2019   HGB 17.0 02/24/2019   HCT 50.0 02/24/2019   HCT 50.0 02/24/2019   MCV 102.6 (H) 02/23/2019   PLT 246 02/23/2019   PROTIME: No results for input(s): LABPROT, INR in the last 72 hours. Chemistry No results for input(s): NA, K, CL, CO2, BUN, CREATININE, CALCIUM, PROT, BILITOT, ALKPHOS, ALT, AST, GLUCOSE in the last 168 hours.  Invalid input(s): LABALBU Lipids Lab Results  Component Value Date   CHOL 145 08/03/2019   HDL 43 08/03/2019   LDLCALC 90 08/03/2019   TRIG 61 08/03/2019   BNP No results found for: PROBNP Thyroid Function Tests: No results for input(s): TSH, T4TOTAL, T3FREE, THYROIDAB in the last 72 hours.  Invalid input(s): FREET3    Miscellaneous Lab Results  Component Value Date   DDIMER 1.07 (H) 02/21/2019    Radiology/Studies:  No results found.  EKG: ***   Assessment and Plan: *** Nathaniel Carr

## 2019-08-24 ENCOUNTER — Institutional Professional Consult (permissible substitution): Payer: Self-pay | Admitting: Internal Medicine

## 2019-08-24 ENCOUNTER — Telehealth: Payer: Self-pay

## 2019-08-24 NOTE — Telephone Encounter (Signed)
Per Dr. Caryl Comes, based off pts last echo, he will not need to come in for his appt to discuss ICD implant. LVM for pt regarding cancellation of appt today. He was instructed to call back with any questions.

## 2019-09-01 MED FILL — ATORVASTATIN 40 MG TABLET: 40 | 30 days supply | Qty: 30 | Fill #2

## 2019-09-01 MED FILL — CARVEDILOL 12.5 MG TABLET: 12.5 | 30 days supply | Qty: 60 | Fill #3

## 2019-09-01 MED FILL — ISOSORBIDE MN ER 60 MG TAB: 60 | 30 days supply | Qty: 30 | Fill #1

## 2019-09-01 MED FILL — spIRONOLACTONE 25 MG TABS: 25 | 30 days supply | Qty: 30 | Fill #5

## 2019-09-01 MED FILL — hydrALAZINE HCL 50 MG TABS: 50 | 30 days supply | Qty: 90 | Fill #1

## 2019-09-01 MED FILL — ASPIRIN ADULT LOW STRENGTH: 81 | 30 days supply | Qty: 30 | Fill #1

## 2019-09-01 MED FILL — FUROSEMIDE 20 MG TABS: 20 | 30 days supply | Qty: 30 | Fill #1

## 2019-09-04 ENCOUNTER — Other Ambulatory Visit (HOSPITAL_COMMUNITY)
Admission: RE | Admit: 2019-09-04 | Discharge: 2019-09-04 | Disposition: A | Discharge status: Home / Self Care | Payer: Self-pay | Source: Ambulatory Visit | Attending: Cardiology | Admitting: Cardiology

## 2019-09-04 DIAGNOSIS — Z20828 Contact with and (suspected) exposure to other viral communicable diseases: Secondary | ICD-10-CM | POA: Insufficient documentation

## 2019-09-04 DIAGNOSIS — Z01812 Encounter for preprocedural laboratory examination: Secondary | ICD-10-CM | POA: Insufficient documentation

## 2019-09-04 LAB — SARS CORONAVIRUS 2 (TAT 6-24 HRS): SARS Coronavirus 2: NEGATIVE

## 2019-09-07 ENCOUNTER — Ambulatory Visit (HOSPITAL_COMMUNITY): Payer: Self-pay | Attending: Cardiology

## 2019-09-07 ENCOUNTER — Other Ambulatory Visit (HOSPITAL_COMMUNITY): Payer: Self-pay | Admitting: *Deleted

## 2019-09-07 ENCOUNTER — Other Ambulatory Visit: Payer: Self-pay

## 2019-09-07 DIAGNOSIS — I5022 Chronic systolic (congestive) heart failure: Secondary | ICD-10-CM

## 2019-09-11 ENCOUNTER — Telehealth (HOSPITAL_COMMUNITY): Payer: Self-pay

## 2019-09-11 NOTE — Telephone Encounter (Signed)
-----   Message from Larey Dresser, MD sent at 09/07/2019  4:09 PM EST ----- Mild to moderate functional impairment due to HF.

## 2019-09-11 NOTE — Telephone Encounter (Signed)
As there have been 3 attempts to reach patient to discuss CPX results without success, letter mailed for him to contact office to disucss

## 2019-09-14 ENCOUNTER — Telehealth (HOSPITAL_COMMUNITY): Payer: Self-pay

## 2019-09-14 NOTE — Telephone Encounter (Signed)
Pt called back after numerous missed calls from office.  Pt called for CPX results. Results reviewed. Questions answered. Verbalized appreciation

## 2019-09-28 MED FILL — hydrALAZINE HCL 50 MG TABS: 50 | 30 days supply | Qty: 90 | Fill #2

## 2019-09-28 MED FILL — ISOSORBIDE MN ER 60 MG TAB: 60 | 30 days supply | Qty: 30 | Fill #2

## 2019-10-05 MED FILL — CARVEDILOL 12.5 MG TABLET: 12.5 | 30 days supply | Qty: 60 | Fill #4

## 2019-10-05 MED FILL — FUROSEMIDE 20 MG TABS: 20 | 30 days supply | Qty: 30 | Fill #2

## 2019-10-05 MED FILL — spIRONOLACTONE 25 MG TABS: 25 | 30 days supply | Qty: 30 | Fill #0

## 2019-10-05 MED FILL — ATORVASTATIN 40 MG TABLET: 40 | 30 days supply | Qty: 30 | Fill #3

## 2019-10-05 MED FILL — ASPIRIN LOW DOSE 81 MG TBEC: 81 | 30 days supply | Qty: 30 | Fill #2

## 2019-10-29 MED FILL — ISOSORBIDE MN ER 60 MG TAB: 60 | 30 days supply | Qty: 30 | Fill #3

## 2019-11-03 ENCOUNTER — Other Ambulatory Visit: Payer: Self-pay

## 2019-11-03 ENCOUNTER — Ambulatory Visit (HOSPITAL_COMMUNITY)
Admission: RE | Admit: 2019-11-03 | Discharge: 2019-11-03 | Disposition: A | Discharge status: Home / Self Care | Payer: 59 | Source: Ambulatory Visit | Attending: Cardiology | Admitting: Cardiology

## 2019-11-03 ENCOUNTER — Encounter (HOSPITAL_COMMUNITY): Payer: Self-pay | Admitting: Cardiology

## 2019-11-03 VITALS — BP 147/102 | HR 79 | Wt 198.8 lb

## 2019-11-03 DIAGNOSIS — I110 Hypertensive heart disease with heart failure: Secondary | ICD-10-CM | POA: Diagnosis not present

## 2019-11-03 DIAGNOSIS — Z79899 Other long term (current) drug therapy: Secondary | ICD-10-CM | POA: Insufficient documentation

## 2019-11-03 DIAGNOSIS — E785 Hyperlipidemia, unspecified: Secondary | ICD-10-CM | POA: Diagnosis not present

## 2019-11-03 DIAGNOSIS — Z8249 Family history of ischemic heart disease and other diseases of the circulatory system: Secondary | ICD-10-CM | POA: Insufficient documentation

## 2019-11-03 DIAGNOSIS — Z7982 Long term (current) use of aspirin: Secondary | ICD-10-CM | POA: Insufficient documentation

## 2019-11-03 DIAGNOSIS — I428 Other cardiomyopathies: Secondary | ICD-10-CM | POA: Diagnosis not present

## 2019-11-03 DIAGNOSIS — Z7901 Long term (current) use of anticoagulants: Secondary | ICD-10-CM | POA: Diagnosis not present

## 2019-11-03 DIAGNOSIS — I5022 Chronic systolic (congestive) heart failure: Secondary | ICD-10-CM | POA: Insufficient documentation

## 2019-11-03 LAB — BASIC METABOLIC PANEL
Anion gap: 9 (ref 5–15)
BUN: 20 mg/dL (ref 6–20)
CO2: 23 mmol/L (ref 22–32)
Calcium: 9.3 mg/dL (ref 8.9–10.3)
Chloride: 109 mmol/L (ref 98–111)
Creatinine, Ser: 1.35 mg/dL — ABNORMAL HIGH (ref 0.61–1.24)
GFR calc Af Amer: >60 mL/min (ref >60)
GFR calc non Af Amer: >60 mL/min (ref >60)
Glucose, Bld: 108 mg/dL — ABNORMAL HIGH (ref 70–99)
Potassium: 3.9 mmol/L (ref 3.5–5.1)
Sodium: 141 mmol/L (ref 135–145)

## 2019-11-03 MED ORDER — HYDRALAZINE HCL 50 MG PO TABS: 75.0000 mg | ORAL_TABLET | Freq: Three times a day (TID) | ORAL | 3 refills | Status: DC

## 2019-11-03 MED ORDER — ISOSORBIDE MONONITRATE ER 60 MG PO TB24: 90.0000 mg | ORAL_TABLET | Freq: Every day | ORAL | 5 refills | Status: DC

## 2019-11-03 MED FILL — hydrALAZINE HCL 50 MG TABS: 50 | 30 days supply | Qty: 135 | Fill #0

## 2019-11-03 MED FILL — SPIRONOLACTONE 25 MG TABS: 25 | 30 days supply | Qty: 30 | Fill #1

## 2019-11-03 MED FILL — ATORVASTATIN 40 MG TABLET: 40 | 30 days supply | Qty: 30 | Fill #4

## 2019-11-03 MED FILL — ASPIRIN LOW DOSE 81 MG TBEC: 81 | 30 days supply | Qty: 30 | Fill #3

## 2019-11-03 MED FILL — FUROSEMIDE 20 MG TABS: 20 | 30 days supply | Qty: 30 | Fill #3

## 2019-11-03 MED FILL — CARVEDILOL 12.5 MG TABLET: 12.5 | 30 days supply | Qty: 60 | Fill #5

## 2019-11-03 NOTE — Progress Notes (Signed)
PCP: Patient, [name] Primary Cardiologist: Dr Shirlee Latch   HPI: Nathaniel Carr. is a 54 y.o. male with a history of HTN, HLD, and anemia. No known prior cardiac history. He had an uncle with heart failure.   Admitted 02/21/2019 with dyspnea and cough. CTA negative for PE, COVID-19 negative. Diagnosed with CHF, diuresed with IV lasix. Echo showed newly reduced EF 20-25%, so HF team was consulted. He underwent RHC/LHC that showed preserved cardiac output, no CAD, and optimized filling pressures post-diuresis. HF meds started. Discharge weight 192.5 pounds.   Echo in 9/20 showed EF remains 25-30% with mild LV dilation.   Echo in 11/20 showed EF 40-45%, mild LVH, mildly decreased RV systolic function, mild-moderate MR.   He returns for followup of CHF.  He is doing well, no complaints today.  BP high in the office, generally SBP 130s when he checks at home.  No significant exertional dyspnea, walks 5-10 miles at [facility] without dyspnea.  No lightheadedness.  No orthopnea/PND.     Labs (5/20): LDL 181 Labs (7/20): K 4.3, creatinine 1.4 Labs (10/20): K 4, creatinine 1.42 => 1.84, LDL 90  PMH: 1. Hyperlipidemia 2. HTN 3. Chronic systolic CHF: Nonischemic cardiomyopathy.  Echo (5/20) with EF 20-25%, normal RV.  - RHC/LHC (5/20): No significant coronary disease.  Mean RA 4, PA 29/17 mean 21, mean PCWP 10, CI 2.93.  - Cardiac MRI (5/20): Moderate LV dilation with diffuse, severe hypokinesis, EF 17%. Normal RV size and systolic function, EF 46%.Moderate MR and moderate TR.  Uninterpretable delayed enhancement images.  - Echo (9/20): EF 25-30%, mild LV dilation, moderate MR, moderate LAE.  - Echo (11/20): EF 40-45%, mild LVH, mildly decreased RV systolic function, mild-moderate MR.  - CPX (11/20): peak VO2 23.7, VE/VCO2 slope 41, RER 1.1.  Mild-moderate HF limitation, restrictive lung physiology likely due to body habitus.   ROS: All systems negative except as listed in HPI, PMH  and Problem List.  SH:  Social History   Socioeconomic History  . Marital status: Married    Spouse name: Not on file  . Number of children: Not on file  . Years of education: Not on file  . Highest education level: Not on file  Occupational History  . Not on file  Tobacco Use  . Smoking status: Never Smoker  . Smokeless tobacco: Never Used  . 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 Alcohol use: No  . Drug use: No  . Sexual activity: Yes  Other Topics Concern  . Not on file  Social History Narrative  . Not on file   Social Determinants of Health   Financial Resource Strain:   . Difficulty of Paying Living Expenses: Not on file  Food Insecurity:   . Worried About Programme researcher, broadcasting/film/video in the Last Year: Not on file  . Ran Out of Food in the Last Year: Not on file  Transportation Needs:   . Lack of Transportation (Medical): Not on file  . Lack of Transportation (Non-Medical): Not on file  Physical Activity:   . Days of Exercise per Week: Not on file  . Minutes of Exercise per Session: Not on file  Stress:   . Feeling of Stress : Not on file  Social Connections:   . Frequency of Communication with Friends and Family: Not on file  . Frequency of Social Gatherings with Friends and Family: Not on file  .  Attends Religious Services: Not on file  . Active Member of Clubs or Organizations: Not on file  . Attends Banker Meetings: Not on file  . Marital Status: Not on file  Intimate Partner Violence:   . Fear of Current or Ex-Partner: Not on file  . Emotionally Abused: Not on file  . Physically Abused: Not on file  . Sexually Abused: Not on file    FH:  Family History  Problem Relation Age of Onset  . Hypertension Paternal Grandfather      Current Outpatient Medications  Medication Sig Dispense Refill  . Ascorbic Acid (VITAMIN C PO) Take 1 tablet by mouth daily.    Marland Kitchen aspirin (ASPIRIN  ADULT LOW STRENGTH) 81 MG EC tablet TAKE 1 TABLET BY MOUTH DAILY 30 tablet 5  . aspirin-acetaminophen-caffeine (EXCEDRIN MIGRAINE) 250-250-65 MG per tablet Take 2 tablets by mouth every 6 (six) hours as needed for headache or migraine.     Marland Kitchen atorvastatin (LIPITOR) 40 MG tablet Take 1 tablet (40 mg total) by mouth daily. 30 tablet 11  . carvedilol (COREG) 12.5 MG tablet Take 1 tablet (12.5 mg total) by mouth 2 (two) times daily with a meal. 60 tablet 6  . Cholecalciferol (VITAMIN D-3 PO) Take 1 capsule by mouth 4 (four) times a week.     . hydrALAZINE (APRESOLINE) 50 MG tablet Take 1.5 tablets (75 mg total) by mouth 3 (three) times daily. 135 tablet 3  . isosorbide mononitrate (IMDUR) 60 MG 24 hr tablet Take 1.5 tablets (90 mg total) by mouth daily. 45 tablet 5  . sacubitril-valsartan (ENTRESTO) 97-103 MG Take 1 tablet by mouth 2 (two) times daily. 180 tablet 3  . spironolactone (ALDACTONE) 25 MG tablet Take 1 tablet (25 mg total) by mouth daily. 30 tablet 11   No current facility-administered medications for this encounter.    Vitals:   11/03/19 0900  BP: (!) 147/102  Pulse: 79  SpO2: 100%  Weight: 90.2 kg (198 lb 12.8 oz)   Wt Readings from Last 3 Encounters:  11/03/19 90.2 kg (198 lb 12.8 oz)  08/03/19 92.2 kg (203 lb 3.2 oz)  07/13/19 94.4 kg (208 lb 3.2 oz)    PHYSICAL EXAM: General: NAD Neck: No JVD, no thyromegaly or thyroid nodule.  Lungs: Clear to auscultation bilaterally with normal respiratory effort. CV: Nondisplaced PMI.  Heart regular S1/S2, no S3/S4, no murmur.  No peripheral edema.  No carotid bruit.  Normal pedal pulses.  Abdomen: Soft, nontender, no hepatosplenomegaly, no distention.  Skin: Intact without lesions or rashes.  Neurologic: Alert and oriented x 3.  Psych: Normal affect. Extremities: No clubbing or cyanosis.  HEENT: Normal.   ASSESSMENT & PLAN: 1. Chronic systolic CHF:  Nonischemic cardiomyopathy.  02/22/2019 Echo with EF 20-25%, diffuse hypokinesis,  normal RV, moderate MR. Cause uncertain. Does have history of HTN. Cath showed no significant coronary disease and preserved cardiac output. Uncle has CHF, no other family members. No ETOH/drugs. TSH was ok. No definite pre-existing URI/viral symptoms. Cardiac MRI in 5/20 showed LVEF 17%, RV normal. Unfortunately, delayed enhancement images were uninterpretable.  Echo in 9/20 showed EF 25-30%, but echo in 11/20 showed EF back up to 40-45%. CPX with mild-moderate HF limitation. NYHA class II symptoms.  He is not volume overloaded on exam.  - I think he can stop Lasix.   - Continue Coreg 12.5 mg bid.   - Continue Entresto 97-103 twice a day. - Continue spironolactone 25 mg daily. BMET today.  -  Increase hydralazine to 75 mg tid and Imdur to 90 mg daily.  - Echo is out of ICD range.   2. HTN: BP high today but has not taken all his meds.   3. Hyperlipidemia: Very high LDL in the past, continue atorvastatin.  Lipids ok in 10/20.    See HF pharmacist in 1 month to titrate up Coreg, see me in 3 months.    Loralie Champagne 11/03/2019

## 2019-11-03 NOTE — Patient Instructions (Signed)
INCREASE Hydralazine to 75mg  (1.5 tabs) three times a day  INCREASE Imdur to 90mg  (1.5 tabs) daily  STOP Lasix (Furosemide)  Your physician recommends that you schedule a follow-up appointment in: 1 month with the Pharmacist  Your physician recommends that you schedule a follow-up appointment in: 3 months with Dr  Please call office at [phone] option 2 if you have any questions or concerns.   At the [facility], you and your health needs are our priority. As part of our continuing mission to provide you with exceptional heart care, we have created designated Provider Care Teams. These Care Teams include your primary Cardiologist (physician) and Advanced Practice Providers (APPs- Physician Assistants and Nurse Practitioners) who all work together to provide you with the care you need, when you need it.   You may see any of the following providers on your designated Care Team at your next follow up: Shirlee Latch Dr [phone] . Dr Marland Kitchen . Arvilla Meres, NP . Marca Ancona, PA . Tonye Becket, PharmD   Please be sure to bring in all your medications bottles to every appointment.

## 2019-11-30 MED FILL — CARVEDILOL 12.5 MG TABLET: 12.5 | 30 days supply | Qty: 60 | Fill #6

## 2019-11-30 MED FILL — ISOSORBIDE MN ER 60 MG TAB: 60 | 30 days supply | Qty: 45 | Fill #0

## 2019-11-30 MED FILL — SM ASPIRIN EC 81 MG TABLET: 81 | 30 days supply | Qty: 30 | Fill #4

## 2019-11-30 MED FILL — SPIRONOLACTONE 25 MG TABS: 25 | 30 days supply | Qty: 30 | Fill #2

## 2019-11-30 MED FILL — ATORVASTATIN 40 MG TABLET: 40 | 30 days supply | Qty: 30 | Fill #5

## 2019-12-07 ENCOUNTER — Encounter (HOSPITAL_COMMUNITY): Payer: Self-pay

## 2019-12-07 ENCOUNTER — Other Ambulatory Visit: Payer: Self-pay

## 2019-12-07 ENCOUNTER — Other Ambulatory Visit (HOSPITAL_COMMUNITY): Payer: Self-pay | Admitting: Cardiology

## 2019-12-07 ENCOUNTER — Ambulatory Visit (HOSPITAL_COMMUNITY)
Admission: RE | Admit: 2019-12-07 | Discharge: 2019-12-07 | Disposition: A | Discharge status: Home / Self Care | Payer: 59 | Source: Ambulatory Visit | Attending: Cardiology | Admitting: Cardiology

## 2019-12-07 VITALS — BP 142/98 | HR 80 | Wt 198.0 lb

## 2019-12-07 DIAGNOSIS — D649 Anemia, unspecified: Secondary | ICD-10-CM | POA: Diagnosis not present

## 2019-12-07 DIAGNOSIS — Z8249 Family history of ischemic heart disease and other diseases of the circulatory system: Secondary | ICD-10-CM | POA: Insufficient documentation

## 2019-12-07 DIAGNOSIS — I110 Hypertensive heart disease with heart failure: Secondary | ICD-10-CM | POA: Diagnosis not present

## 2019-12-07 DIAGNOSIS — I5021 Acute systolic (congestive) heart failure: Secondary | ICD-10-CM

## 2019-12-07 DIAGNOSIS — Z79899 Other long term (current) drug therapy: Secondary | ICD-10-CM | POA: Insufficient documentation

## 2019-12-07 DIAGNOSIS — I10 Essential (primary) hypertension: Secondary | ICD-10-CM | POA: Diagnosis present

## 2019-12-07 DIAGNOSIS — E785 Hyperlipidemia, unspecified: Secondary | ICD-10-CM | POA: Diagnosis not present

## 2019-12-07 DIAGNOSIS — I428 Other cardiomyopathies: Secondary | ICD-10-CM | POA: Insufficient documentation

## 2019-12-07 DIAGNOSIS — I5022 Chronic systolic (congestive) heart failure: Secondary | ICD-10-CM | POA: Insufficient documentation

## 2019-12-07 MED ORDER — CARVEDILOL 25 MG PO TABS: 25.0000 mg | ORAL_TABLET | Freq: Two times a day (BID) | ORAL | 3 refills | Status: DC

## 2019-12-07 MED FILL — CARVEDILOL 25 MG TABLET: 25 | 30 days supply | Qty: 60 | Fill #0

## 2019-12-07 NOTE — Progress Notes (Signed)
PCP: Patient, [name] Primary Cardiologist: Dr. Shirlee Latch   HPI:  Nathaniel Carris a 54 y.o.malewith a history of HTN, HLD, and anemia. No known prior cardiac history. He had an uncle with heart failure.   Admitted 02/21/2019 with dyspnea and cough. CTA negative for PE, COVID-19 negative. Diagnosed with CHF, diuresed with IV lasix. Echo showed newly reduced EF 20-25%, so HF team was consulted.He underwent RHC/LHC that showed preserved cardiac output, no CAD, and optimized filling pressures post-diuresis. HF meds started. Discharge weight 192.5 pounds.   Echo in 9/20 showed EF remains 25-30% with mild LV dilation.   Echo in 11/20 showed EF 40-45%, mild LVH, mildly decreased RV systolic function, mild-moderate MR.  He was last seen by Dr. Shirlee Latch on 11/03/19 and his BP was elevated at 147/102 (but reports he had not taken all of his medications and that home SBP ~130s). He appeared euvolemic on exam and his furosemide was stopped. His isosorbide was also increased to 90 mg daily.  He presents to HF PharmD clinic for follow up. Overall, he is feeling a little tired today but generally doing well. He denies dizziness and lightheadedness. He believes his fatigue is secondary to poor sleep habits over the last few days. He denies any chest pain or palpitations. His breathing is good. Denies shortness of breath. He is able to complete all ADLs. He exercises several times per week - he does strength training with weights 3 times weekly and walks for 30-45 mins every other day. His weight at home ranges from 190-195 lbs. Denies PND/orthopnea. He reports his appetite is variable - he says there are periods where he feels hungry but doesn't feel like eating and other times his appetite is fine.  . Shortness of breath/dyspnea on exertion? no  . Orthopnea/PND? no . Edema? no . Lightheadedness/dizziness? no . Daily weights at home? yes . Blood pressure/heart rate monitoring at home? Yes -  110/90s . Following low-sodium/fluid-restricted diet? yes  HF Medications: Entresto 97/103 mg BID Carvedilol 12.5 mg BID Spironolactone 25 mg daily Hydralazine 75 mg TID Isosorbide mononitrate 90 mg daily  Has the patient been experiencing any side effects to the medications prescribed?  no  Does the patient have any problems obtaining medications due to transportation or finances?  Yes - no prescription insurance - uses HF fund with [facility] outpatient pharmacy  Understanding of regimen: good Understanding of indications: good Potential of compliance: good Patient understands to avoid NSAIDs. Patient understands to avoid decongestants.    Pertinent Lab Values (11/03/19): Marland Kitchen Serum creatinine 1.35, BUN 20, Potassium 3.9, Sodium 141  Vital Signs: . Weight: 198.6 lbs (dry weight: 190-195 lbs) . Blood pressure: 142/98 mmHg . Heart rate: 80 bpm    Assessment: 1. Chronic systolic CHF:  Nonischemic cardiomyopathy.  02/22/2019 Echo with EF 20-25%, diffuse hypokinesis, normal RV, moderate MR. Cause uncertain. Does have history of HTN. Cath showed no significant coronary disease and preserved cardiac output. Uncle has CHF, no other family members. No ETOH/drugs. TSH was ok. No definite pre-existing URI/viral symptoms. Cardiac MRI in 5/20 showed LVEF 17%, RV normal. Unfortunately, delayed enhancement images were uninterpretable.  Echo in 9/20 showed EF 25-30%, but echo in 11/20 showed EF back up to 40-45%. CPX with mild-moderate HF limitation.  - NYHA class II symptoms, euvolemic on exam. - Increase carvedilol to 25 mg BID - Continue Entresto 97/103 mg BID - Continue spironolactone 25 mg daily - Continue hydralazine 75 mg TID - Continue isosorbide mononitrate 90  mg daily - Provided pt with pill box to enhance compliance and ease of taking multiple medications per day - Basic disease state pathophysiology, medication indication, mechanism and side effects reviewed at length with patient and he  verbalized understanding  2. HTN:  - Increase carvedilol to 25 mg BID as above. - Continue other anti-HTN as above  3. Hyperlipidemia: - Very high LDL in the past. LDL in 07/2019 was 90 - Continue atorvastatin.    Plan: 1) Medication changes: Based on clinical presentation, vital signs and recent labs will increase carvedilol to 25 mg BID 2) Labs: none 3) Follow-up: 4/26 with Dr. Johny Shock, PharmD, BCPS Clinical Pharmacist Phone: [phone] 12/07/2019 1:59 PM  Audry Riles, PharmD, BCPS, BCCP, CPP [facility] Pharmacist [phone]

## 2019-12-07 NOTE — Patient Instructions (Signed)
It was a pleasure seeing you today!  MEDICATIONS: -We are changing your medications today -Increase your carvedilol to 1 tablet (25 mg) by mouth twice daily -Continue your other medications as prescribed -Call if you have questions about your medications.  NEXT APPOINTMENT: Return to clinic in 1 month with Dr. Shirlee Latch.  In general, to take care of your heart failure: -Limit your fluid intake to 2 Liters (half-gallon) per day.   -Limit your salt intake to ideally 2-3 grams (2000-3000 mg) per day. -Weigh yourself daily and record, and bring that "weight diary" to your next appointment.  (Weight gain of 2-3 pounds in 1 day typically means fluid weight.) -The medications for your heart are to help your heart and help you live longer.   -Please contact us before stopping any of your heart medications.  Call the clinic at [phone] with questions or to reschedule future appointments.

## 2019-12-15 MED FILL — hydrALAZINE HCL 50 MG TABS: 50 | 30 days supply | Qty: 135 | Fill #1

## 2019-12-31 MED FILL — CARVEDILOL 25 MG TABLET: 25 | 30 days supply | Qty: 60 | Fill #1

## 2019-12-31 MED FILL — ISOSORBIDE MN ER 60 MG TAB: 60 | 30 days supply | Qty: 45 | Fill #1

## 2019-12-31 MED FILL — SM ASPIRIN EC 81 MG TABLET: 81 | 30 days supply | Qty: 30 | Fill #5

## 2019-12-31 MED FILL — ATORVASTATIN 40 MG TABLET: 40 | 30 days supply | Qty: 30 | Fill #6

## 2019-12-31 MED FILL — SPIRONOLACTONE 25 MG TABS: 25 | 30 days supply | Qty: 30 | Fill #3

## 2020-01-25 ENCOUNTER — Other Ambulatory Visit (HOSPITAL_COMMUNITY): Payer: Self-pay | Admitting: Cardiology

## 2020-01-25 ENCOUNTER — Other Ambulatory Visit (HOSPITAL_COMMUNITY): Payer: Self-pay | Admitting: *Deleted

## 2020-01-25 MED ORDER — SACUBITRIL-VALSARTAN 97-103 MG PO TABS: 1.0000 | ORAL_TABLET | Freq: Two times a day (BID) | ORAL | 3 refills | Status: DC

## 2020-01-25 MED FILL — SPIRONOLACTONE 25 MG TABS: 25 | 30 days supply | Qty: 30 | Fill #4

## 2020-01-25 MED FILL — CARVEDILOL 25 MG TABLET: 25 | 30 days supply | Qty: 60 | Fill #2

## 2020-01-25 MED FILL — hydrALAZINE HCL 50 MG TABS: 50 | 30 days supply | Qty: 135 | Fill #2

## 2020-01-25 MED FILL — ISOSORBIDE MN ER 60 MG TAB: 60 | 30 days supply | Qty: 45 | Fill #2

## 2020-01-25 MED FILL — ATORVASTATIN 40 MG TABLET: 40 | 30 days supply | Qty: 30 | Fill #7

## 2020-01-26 ENCOUNTER — Other Ambulatory Visit (HOSPITAL_COMMUNITY): Payer: Self-pay | Admitting: *Deleted

## 2020-01-26 MED ORDER — SACUBITRIL-VALSARTAN 97-103 MG PO TABS: 1.0000 | ORAL_TABLET | Freq: Two times a day (BID) | ORAL | 3 refills | Status: DC

## 2020-02-01 ENCOUNTER — Ambulatory Visit (HOSPITAL_COMMUNITY)
Admission: RE | Admit: 2020-02-01 | Discharge: 2020-02-01 | Disposition: A | Discharge status: Home / Self Care | Payer: 59 | Source: Ambulatory Visit | Attending: Cardiology | Admitting: Cardiology

## 2020-02-01 ENCOUNTER — Other Ambulatory Visit: Payer: Self-pay

## 2020-02-01 VITALS — BP 138/74 | HR 73 | Wt 196.0 lb

## 2020-02-01 DIAGNOSIS — I110 Hypertensive heart disease with heart failure: Secondary | ICD-10-CM | POA: Insufficient documentation

## 2020-02-01 DIAGNOSIS — Z7982 Long term (current) use of aspirin: Secondary | ICD-10-CM | POA: Insufficient documentation

## 2020-02-01 DIAGNOSIS — I428 Other cardiomyopathies: Secondary | ICD-10-CM | POA: Diagnosis not present

## 2020-02-01 DIAGNOSIS — I5022 Chronic systolic (congestive) heart failure: Secondary | ICD-10-CM

## 2020-02-01 DIAGNOSIS — Z8249 Family history of ischemic heart disease and other diseases of the circulatory system: Secondary | ICD-10-CM | POA: Insufficient documentation

## 2020-02-01 DIAGNOSIS — Z7984 Long term (current) use of oral hypoglycemic drugs: Secondary | ICD-10-CM | POA: Insufficient documentation

## 2020-02-01 DIAGNOSIS — E785 Hyperlipidemia, unspecified: Secondary | ICD-10-CM | POA: Insufficient documentation

## 2020-02-01 DIAGNOSIS — Z79899 Other long term (current) drug therapy: Secondary | ICD-10-CM | POA: Insufficient documentation

## 2020-02-01 DIAGNOSIS — I5021 Acute systolic (congestive) heart failure: Secondary | ICD-10-CM | POA: Diagnosis not present

## 2020-02-01 LAB — BASIC METABOLIC PANEL
Anion gap: 7 (ref 5–15)
BUN: 16 mg/dL (ref 6–20)
CO2: 24 mmol/L (ref 22–32)
Calcium: 9.1 mg/dL (ref 8.9–10.3)
Chloride: 108 mmol/L (ref 98–111)
Creatinine, Ser: 1.14 mg/dL (ref 0.61–1.24)
GFR calc Af Amer: >60 mL/min (ref >60)
GFR calc non Af Amer: >60 mL/min (ref >60)
Glucose, Bld: 110 mg/dL — ABNORMAL HIGH (ref 70–99)
Potassium: 3.9 mmol/L (ref 3.5–5.1)
Sodium: 139 mmol/L (ref 135–145)

## 2020-02-01 MED ORDER — ASPIRIN 81 MG PO TBEC: 81.0000 mg | DELAYED_RELEASE_TABLET | Freq: Every day | ORAL | 5 refills | Status: DC

## 2020-02-01 MED ORDER — DAPAGLIFLOZIN PROPANEDIOL 10 MG PO TABS: 10.0000 mg | ORAL_TABLET | Freq: Every day | ORAL | 5 refills | Status: DC

## 2020-02-01 MED FILL — SM ASPIRIN EC 81 MG TABLET: 81 | 30 days supply | Qty: 30 | Fill #0

## 2020-02-01 MED FILL — FARXIGA 10 MG TABLET: 10 | 30 days supply | Qty: 30 | Fill #0

## 2020-02-01 NOTE — Addendum Note (Signed)
Encounter addended by: Marisa Hua, RN on: 02/01/2020 9:49 AM  Actions taken: Pharmacy for encounter modified, Order list changed

## 2020-02-01 NOTE — Addendum Note (Signed)
Encounter addended by: Laurey Morale, MD on: 02/01/2020 10:00 PM  Actions taken: Clinical Note Signed, Level of Service modified, Visit diagnoses modified

## 2020-02-01 NOTE — Progress Notes (Signed)
PCP: Patient, [name] Primary Cardiologist: Dr Shirlee Latch   HPI: Nathaniel Jech. is a 54 y.o. male with a history of HTN, HLD, and anemia. No known prior cardiac history. He had an uncle with heart failure.   Admitted 02/21/2019 with dyspnea and cough. CTA negative for PE, COVID-19 negative. Diagnosed with CHF, diuresed with IV lasix. Echo showed newly reduced EF 20-25%, so HF team was consulted. He underwent RHC/LHC that showed preserved cardiac output, no CAD, and optimized filling pressures post-diuresis. HF meds started. Discharge weight 192.5 pounds.   Echo in 9/20 showed EF remains 25-30% with mild LV dilation.   Echo in 11/20 showed EF 40-45%, mild LVH, mildly decreased RV systolic function, mild-moderate MR.   He returns for followup of CHF.  He continues to do well symptomatically. Dyspnea only with heavy lifting.  No problems walking on flat ground.  Walks in the park for exercise.  No chest pain.  No orthopnea/PND.  Weight down 2 lbs.   ECG (personally reviewed): NSR, inferior and lateral TWIs   Labs (5/20): LDL 181 Labs (7/20): K 4.3, creatinine 1.4 Labs (10/20): K 4, creatinine 1.42 => 1.84, LDL 90 Labs (1/21): K 3.9, creatinine 1.35  PMH: 1. Hyperlipidemia 2. HTN 3. Chronic systolic CHF: Nonischemic cardiomyopathy.  Echo (5/20) with EF 20-25%, normal RV.  - RHC/LHC (5/20): No significant coronary disease.  Mean RA 4, PA 29/17 mean 21, mean PCWP 10, CI 2.93.  - Cardiac MRI (5/20): Moderate LV dilation with diffuse, severe hypokinesis, EF 17%. Normal RV size and systolic function, EF 46%.Moderate MR and moderate TR.  Uninterpretable delayed enhancement images.  - Echo (9/20): EF 25-30%, mild LV dilation, moderate MR, moderate LAE.  - Echo (11/20): EF 40-45%, mild LVH, mildly decreased RV systolic function, mild-moderate MR.  - CPX (11/20): peak VO2 23.7, VE/VCO2 slope 41, RER 1.1.  Mild-moderate HF limitation, restrictive lung physiology likely due to body habitus.     ROS: All systems negative except as listed in HPI, PMH and Problem List.  SH:  Social History   Socioeconomic History  . Marital status: Married    Spouse name: Not on file  . Number of children: Not on file  . Years of education: Not on file  . Highest education level: Not on file  Occupational History  . Not on file  Tobacco Use  . Smoking status: Never Smoker  . Smokeless tobacco: Never Used  . 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 Alcohol use: No  . Drug use: No  . Sexual activity: Yes  Other Topics Concern  . Not on file  Social History Narrative  . Not on file   Social Determinants of Health   Financial Resource Strain:   . Difficulty of Paying Living Expenses:   Food Insecurity:   . Worried About Programme researcher, broadcasting/film/video in the Last Year:   . Barista in the Last Year:   Transportation Needs:   . Freight forwarder (Medical):   Marland Kitchen Lack of Transportation (Non-Medical):   Physical Activity:   . Days of Exercise per Week:   . Minutes of Exercise per Session:   Stress:   . Feeling of Stress :   Social Connections:   . Frequency of Communication with Friends and Family:   . Frequency of Social Gatherings with Friends and Family:   . Attends Religious Services:   . Active  Member of Clubs or Organizations:   . Attends Banker Meetings:   Marland Kitchen Marital Status:   Intimate Partner Violence:   . Fear of Current or Ex-Partner:   . Emotionally Abused:   Marland Kitchen Physically Abused:   . Sexually Abused:     FH:  Family History  Problem Relation Age of Onset  . Hypertension Paternal Grandfather      Current Outpatient Medications  Medication Sig Dispense Refill  . aspirin (SM ASPIRIN ADULT LOW STRENGTH) 81 MG EC tablet Take 1 tablet (81 mg total) by mouth daily. Swallow whole. 30 tablet 5  . aspirin-acetaminophen-caffeine (EXCEDRIN MIGRAINE) 250-250-65 MG per tablet Take 2 tablets  by mouth every 6 (six) hours as needed for headache or migraine.     Marland Kitchen atorvastatin (LIPITOR) 40 MG tablet Take 1 tablet (40 mg total) by mouth daily. 30 tablet 11  . carvedilol (COREG) 25 MG tablet Take 1 tablet (25 mg total) by mouth 2 (two) times daily with a meal. 180 tablet 3  . Cholecalciferol (VITAMIN D-3 PO) Take 1 capsule by mouth 4 (four) times a week.     . hydrALAZINE (APRESOLINE) 50 MG tablet Take 1.5 tablets (75 mg total) by mouth 3 (three) times daily. 135 tablet 3  . isosorbide mononitrate (IMDUR) 60 MG 24 hr tablet Take 1.5 tablets (90 mg total) by mouth daily. 45 tablet 5  . sacubitril-valsartan (ENTRESTO) 97-103 MG Take 1 tablet by mouth 2 (two) times daily. 180 tablet 3  . spironolactone (ALDACTONE) 25 MG tablet Take 1 tablet (25 mg total) by mouth daily. 30 tablet 11  . dapagliflozin propanediol (FARXIGA) 10 MG TABS tablet Take 10 mg by mouth daily before breakfast. 30 tablet 5   No current facility-administered medications for this encounter.    Vitals:   02/01/20 0855  BP: 138/74  Pulse: 73  SpO2: 97%  Weight: 88.9 kg (196 lb)   Wt Readings from Last 3 Encounters:  02/01/20 88.9 kg (196 lb)  12/07/19 89.8 kg (198 lb)  11/03/19 90.2 kg (198 lb 12.8 oz)    PHYSICAL EXAM: General: NAD Neck: No JVD, no thyromegaly or thyroid nodule.  Lungs: Clear to auscultation bilaterally with normal respiratory effort. CV: Nondisplaced PMI.  Heart regular S1/S2, no S3/S4, no murmur.  No peripheral edema.  No carotid bruit.  Normal pedal pulses.  Abdomen: Soft, nontender, no hepatosplenomegaly, no distention.  Skin: Intact without lesions or rashes.  Neurologic: Alert and oriented x 3.  Psych: Normal affect. Extremities: No clubbing or cyanosis.  HEENT: Normal.   ASSESSMENT & PLAN: 1. Chronic systolic CHF:  Nonischemic cardiomyopathy.  02/22/2019 Echo with EF 20-25%, diffuse hypokinesis, normal RV, moderate MR. Cause uncertain. Does have history of HTN. Cath showed no  significant coronary disease and preserved cardiac output. Uncle has CHF, no other family members. No ETOH/drugs. TSH was ok. No definite pre-existing URI/viral symptoms. Cardiac MRI in 5/20 showed LVEF 17%, RV normal. Unfortunately, delayed enhancement images were uninterpretable.  Echo in 9/20 showed EF 25-30%, but echo in 11/20 showed EF back up to 40-45%. CPX with mild-moderate HF limitation. NYHA class II symptoms, now volume overloaded on exam.   - Continue Coreg 25 mg bid.   - Continue Entresto 97-103 twice a day. - Continue spironolactone 25 mg daily. BMET today.  - Continue hydralazine 75 mg tid and Imdur 90 mg daily.  - Add dapagliflozin 10 mg daily. BMET today and in 10 days.  - Echo is out of ICD  range.   2. HTN: BP borderline elevated.    3. Hyperlipidemia: Very high LDL in the past, continue atorvastatin.  Lipids ok in 10/20.    Followup in 4 months.    Loralie Champagne 02/01/2020

## 2020-02-01 NOTE — Patient Instructions (Signed)
START Farxiga 10mg  (1 tab) daily  Labs today and repeat in 10 days We will only contact you if something comes back abnormal or we need to make some changes. Otherwise no news is good news!  Your physician recommends that you schedule a follow-up appointment in: 4 month with Dr  Please call office at [phone] option [phone] if you have any questions or concerns.   At the [facility], you and your health needs are our priority. As part of our continuing mission to provide you with exceptional heart care, we have created designated Provider Care Teams. These Care Teams include your primary Cardiologist (physician) and Advanced Practice Providers (APPs- Physician Assistants and Nurse Practitioners) who all work together to provide you with the care you need, when you need it.   You may see any of the following providers on your designated Care Team at your next follow up: [phone] Dr Marland Kitchen . Dr Arvilla Meres . Marca Ancona, NP . Tonye Becket, PA . Robbie Lis, PharmD   Please be sure to bring in all your medications bottles to every appointment.

## 2020-02-02 ENCOUNTER — Other Ambulatory Visit (HOSPITAL_COMMUNITY): Payer: Self-pay

## 2020-02-02 MED ORDER — DAPAGLIFLOZIN PROPANEDIOL 10 MG PO TABS: 10.0000 mg | ORAL_TABLET | Freq: Every day | ORAL | 5 refills | Status: DC

## 2020-02-09 ENCOUNTER — Telehealth (HOSPITAL_COMMUNITY): Payer: Self-pay | Admitting: Pharmacist

## 2020-02-09 NOTE — Telephone Encounter (Signed)
Received message from [employer] that patient is due for re-enrollment for [facility] patient assistance. Left patient a VM asking him to call me back so we can start the application process.   Karle Plumber, PharmD, BCPS, BCCP, CPP [facility] Pharmacist [phone]

## 2020-02-12 NOTE — Telephone Encounter (Signed)
Left [employer] re-enrollment application at front desk of clinic for patient to sign.  Karle Plumber, PharmD, BCPS, BCCP, CPP [facility] Pharmacist [phone]

## 2020-02-15 ENCOUNTER — Other Ambulatory Visit (HOSPITAL_COMMUNITY): Payer: Self-pay | Admitting: *Deleted

## 2020-02-15 ENCOUNTER — Ambulatory Visit (HOSPITAL_COMMUNITY)
Admission: RE | Admit: 2020-02-15 | Discharge: 2020-02-15 | Disposition: A | Discharge status: Home / Self Care | Payer: 59 | Source: Ambulatory Visit | Attending: Cardiology | Admitting: Cardiology

## 2020-02-15 ENCOUNTER — Other Ambulatory Visit: Payer: Self-pay

## 2020-02-15 DIAGNOSIS — I5022 Chronic systolic (congestive) heart failure: Secondary | ICD-10-CM

## 2020-02-15 LAB — BASIC METABOLIC PANEL
Anion gap: 9 (ref 5–15)
BUN: 23 mg/dL — ABNORMAL HIGH (ref 6–20)
CO2: 23 mmol/L (ref 22–32)
Calcium: 9.3 mg/dL (ref 8.9–10.3)
Chloride: 109 mmol/L (ref 98–111)
Creatinine, Ser: 1.37 mg/dL — ABNORMAL HIGH (ref 0.61–1.24)
GFR calc Af Amer: >60 mL/min (ref >60)
GFR calc non Af Amer: 59 mL/min — ABNORMAL LOW (ref >60)
Glucose, Bld: 105 mg/dL — ABNORMAL HIGH (ref 70–99)
Potassium: 4.2 mmol/L (ref 3.5–5.1)
Sodium: 141 mmol/L (ref 135–145)

## 2020-02-15 NOTE — Telephone Encounter (Signed)
Sent in Manufacturer's Assistance application to [employer] for Entresto re-enrollment.    Application pending, will continue to follow.   Kelle Ruppert, PharmD, BCPS, BCCP, CPP [facility] Pharmacist [phone]  

## 2020-02-26 ENCOUNTER — Telehealth (HOSPITAL_COMMUNITY): Payer: Self-pay | Admitting: Pharmacy Technician

## 2020-02-26 MED FILL — SPIRONOLACTONE 25 MG TABS: 25 | 30 days supply | Qty: 30 | Fill #5

## 2020-02-26 MED FILL — hydrALAZINE HCL 50 MG TABS: 50 | 30 days supply | Qty: 135 | Fill #3

## 2020-02-26 MED FILL — CARVEDILOL 25 MG TABLET: 25 | 30 days supply | Qty: 60 | Fill #3

## 2020-02-26 MED FILL — SM ASPIRIN EC 81 MG TABLET: 81 | 30 days supply | Qty: 30 | Fill #1

## 2020-02-26 MED FILL — ATORVASTATIN 40 MG TABLET: 40 | 30 days supply | Qty: 30 | Fill #8

## 2020-02-26 NOTE — Telephone Encounter (Signed)
Patient Advocate Encounter   Received notification from [employer] that prior authorization for Marcelline Deist is required.   PA submitted on [employer]. Key [id number] Status is pending   Will continue to follow.

## 2020-02-29 ENCOUNTER — Telehealth (HOSPITAL_COMMUNITY): Payer: Self-pay | Admitting: Pharmacist

## 2020-02-29 NOTE — Telephone Encounter (Addendum)
Entered in error

## 2020-03-02 NOTE — Telephone Encounter (Signed)
Switching patient to [place] since Nathaniel Carr was denied. 90 day supply of Jardiance is $500, with copay card, it is now $10.  Sherryll Burger was approved by insurance as well. 90 day supply is $500, with co-pay card, it is now $10.  Pharmacy Billing Information  Jardiance Co-pay Card  BIN: [id number]  PCN: [id number]  Group: [id number]  ID: [id number]  Entresto Co-pay Card  BIN: [id number]  PCN: [id number]  Group: [id number]  ID: [id number]  Called and left patient a voicemail to call me back.

## 2020-03-04 NOTE — Telephone Encounter (Signed)
Patient's insurance sent an approval for [place].  Approved 03/03/20-03/03/21  Pharmacy Billing Information  BIN: [id number]  PCN: [id number]  Group: [id number]  ID: [id number]  Called [facility] gave billing information and updated them with the change.  Archer Asa, CPhT

## 2020-03-04 NOTE — Telephone Encounter (Signed)
Patient now has Nurse, learning disability. Will cancel [employer] re-enrollment.   Karle Plumber, PharmD, BCPS, BCCP, CPP [facility] Pharmacist [phone]

## 2020-03-08 ENCOUNTER — Telehealth (HOSPITAL_COMMUNITY): Payer: Self-pay

## 2020-03-08 NOTE — Telephone Encounter (Signed)
Received a fax from [facility] Disability Determination Services for medical records dating from 08/2017-present. Medical records were faxed to number provided on [phone]. Forms will be scanned into patients chart.

## 2020-03-23 ENCOUNTER — Inpatient Hospital Stay (HOSPITAL_COMMUNITY): Payer: 59

## 2020-03-23 ENCOUNTER — Emergency Department (HOSPITAL_COMMUNITY): Payer: 59

## 2020-03-23 ENCOUNTER — Encounter (HOSPITAL_COMMUNITY): Payer: Self-pay

## 2020-03-23 ENCOUNTER — Other Ambulatory Visit: Payer: Self-pay

## 2020-03-23 ENCOUNTER — Inpatient Hospital Stay (HOSPITAL_COMMUNITY)
Admission: EM | Admit: 2020-03-23 | Discharge: 2020-03-26 | DRG: 389 | Disposition: A | Discharge status: Home / Self Care | Payer: 59 | Attending: Internal Medicine | Admitting: Internal Medicine

## 2020-03-23 DIAGNOSIS — Z20822 Contact with and (suspected) exposure to covid-19: Secondary | ICD-10-CM | POA: Diagnosis present

## 2020-03-23 DIAGNOSIS — N289 Disorder of kidney and ureter, unspecified: Secondary | ICD-10-CM | POA: Diagnosis present

## 2020-03-23 DIAGNOSIS — I361 Nonrheumatic tricuspid (valve) insufficiency: Secondary | ICD-10-CM | POA: Diagnosis not present

## 2020-03-23 DIAGNOSIS — Z885 Allergy status to narcotic agent status: Secondary | ICD-10-CM | POA: Diagnosis not present

## 2020-03-23 DIAGNOSIS — E785 Hyperlipidemia, unspecified: Secondary | ICD-10-CM | POA: Diagnosis present

## 2020-03-23 DIAGNOSIS — K56609 Unspecified intestinal obstruction, unspecified as to partial versus complete obstruction: Secondary | ICD-10-CM | POA: Diagnosis present

## 2020-03-23 DIAGNOSIS — E872 Acidosis: Secondary | ICD-10-CM | POA: Diagnosis present

## 2020-03-23 DIAGNOSIS — K56600 Partial intestinal obstruction, unspecified as to cause: Secondary | ICD-10-CM | POA: Diagnosis not present

## 2020-03-23 DIAGNOSIS — I110 Hypertensive heart disease with heart failure: Secondary | ICD-10-CM | POA: Diagnosis present

## 2020-03-23 DIAGNOSIS — Z8249 Family history of ischemic heart disease and other diseases of the circulatory system: Secondary | ICD-10-CM

## 2020-03-23 DIAGNOSIS — Z91048 Other nonmedicinal substance allergy status: Secondary | ICD-10-CM

## 2020-03-23 DIAGNOSIS — Z7982 Long term (current) use of aspirin: Secondary | ICD-10-CM | POA: Diagnosis not present

## 2020-03-23 DIAGNOSIS — K760 Fatty (change of) liver, not elsewhere classified: Secondary | ICD-10-CM | POA: Diagnosis present

## 2020-03-23 DIAGNOSIS — I5022 Chronic systolic (congestive) heart failure: Secondary | ICD-10-CM | POA: Diagnosis present

## 2020-03-23 DIAGNOSIS — R17 Unspecified jaundice: Secondary | ICD-10-CM

## 2020-03-23 DIAGNOSIS — I160 Hypertensive urgency: Secondary | ICD-10-CM | POA: Diagnosis present

## 2020-03-23 DIAGNOSIS — I509 Heart failure, unspecified: Secondary | ICD-10-CM

## 2020-03-23 DIAGNOSIS — Z91013 Allergy to seafood: Secondary | ICD-10-CM | POA: Diagnosis not present

## 2020-03-23 DIAGNOSIS — Z79899 Other long term (current) drug therapy: Secondary | ICD-10-CM | POA: Diagnosis not present

## 2020-03-23 DIAGNOSIS — I340 Nonrheumatic mitral (valve) insufficiency: Secondary | ICD-10-CM | POA: Diagnosis present

## 2020-03-23 DIAGNOSIS — I428 Other cardiomyopathies: Secondary | ICD-10-CM | POA: Diagnosis present

## 2020-03-23 DIAGNOSIS — D649 Anemia, unspecified: Secondary | ICD-10-CM | POA: Diagnosis present

## 2020-03-23 DIAGNOSIS — Z0189 Encounter for other specified special examinations: Secondary | ICD-10-CM

## 2020-03-23 DIAGNOSIS — R7989 Other specified abnormal findings of blood chemistry: Secondary | ICD-10-CM

## 2020-03-23 DIAGNOSIS — I10 Essential (primary) hypertension: Secondary | ICD-10-CM

## 2020-03-23 DIAGNOSIS — Z880 Allergy status to penicillin: Secondary | ICD-10-CM | POA: Diagnosis not present

## 2020-03-23 HISTORY — DX: Other cardiomyopathies: I42.8

## 2020-03-23 LAB — COMPREHENSIVE METABOLIC PANEL
ALT: 27 U/L (ref 0–44)
AST: 34 U/L (ref 15–41)
Albumin: 4.8 g/dL (ref 3.5–5.0)
Alkaline Phosphatase: 44 U/L (ref 38–126)
Anion gap: 14 (ref 5–15)
BUN: 13 mg/dL (ref 6–20)
CO2: 21 mmol/L — ABNORMAL LOW (ref 22–32)
Calcium: 10.1 mg/dL (ref 8.9–10.3)
Chloride: 106 mmol/L (ref 98–111)
Creatinine, Ser: 1.42 mg/dL — ABNORMAL HIGH (ref 0.61–1.24)
GFR calc Af Amer: >60 mL/min (ref >60)
GFR calc non Af Amer: 57 mL/min — ABNORMAL LOW (ref >60)
Glucose, Bld: 144 mg/dL — ABNORMAL HIGH (ref 70–99)
Potassium: 4.2 mmol/L (ref 3.5–5.1)
Sodium: 141 mmol/L (ref 135–145)
Total Bilirubin: 1.7 mg/dL — ABNORMAL HIGH (ref 0.3–1.2)
Total Protein: 7.7 g/dL (ref 6.5–8.1)

## 2020-03-23 LAB — BILIRUBIN, FRACTIONATED(TOT/DIR/INDIR)
Bilirubin, Direct: 0.2 mg/dL (ref 0.0–0.2)
Indirect Bilirubin: 1.2 mg/dL — ABNORMAL HIGH (ref 0.3–0.9)
Total Bilirubin: 1.4 mg/dL — ABNORMAL HIGH (ref 0.3–1.2)

## 2020-03-23 LAB — CBC WITH DIFFERENTIAL/PLATELET
Abs Immature Granulocytes: 0.03 10*3/uL (ref 0.00–0.07)
Basophils Absolute: 0 10*3/uL (ref 0.0–0.1)
Basophils Relative: 0 %
Eosinophils Absolute: 0 10*3/uL (ref 0.0–0.5)
Eosinophils Relative: 0 %
HCT: 45.1 % (ref 39.0–52.0)
Hemoglobin: 15.1 g/dL (ref 13.0–17.0)
Immature Granulocytes: 0 %
Lymphocytes Relative: 9 %
Lymphs Abs: 0.6 10*3/uL — ABNORMAL LOW (ref 0.7–4.0)
MCH: 32.4 pg (ref 26.0–34.0)
MCHC: 33.5 g/dL (ref 30.0–36.0)
MCV: 96.8 fL (ref 80.0–100.0)
Monocytes Absolute: 0.3 10*3/uL (ref 0.1–1.0)
Monocytes Relative: 4 %
Neutro Abs: 6.2 10*3/uL (ref 1.7–7.7)
Neutrophils Relative %: 87 %
Platelets: 246 10*3/uL (ref 150–400)
RBC: 4.66 MIL/uL (ref 4.22–5.81)
RDW: 12.9 % (ref 11.5–15.5)
WBC: 7.2 10*3/uL (ref 4.0–10.5)
nRBC: 0 % (ref 0.0–0.2)

## 2020-03-23 LAB — HIV ANTIBODY (ROUTINE TESTING W REFLEX): HIV Screen 4th Generation wRfx: NONREACTIVE

## 2020-03-23 LAB — URINALYSIS, ROUTINE W REFLEX MICROSCOPIC
Bacteria, UA: NONE SEEN
Bilirubin Urine: NEGATIVE
Glucose, UA: 50 mg/dL — AB
Ketones, ur: 20 mg/dL — AB
Leukocytes,Ua: NEGATIVE
Nitrite: NEGATIVE
Protein, ur: 100 mg/dL — AB
Specific Gravity, Urine: 1.024 (ref 1.005–1.030)
pH: 7 (ref 5.0–8.0)

## 2020-03-23 LAB — LIPASE, BLOOD: Lipase: 23 U/L (ref 11–51)

## 2020-03-23 LAB — SARS CORONAVIRUS 2 BY RT PCR (HOSPITAL ORDER, PERFORMED IN ~~LOC~~ HOSPITAL LAB): SARS Coronavirus 2: NEGATIVE

## 2020-03-23 LAB — LACTIC ACID, PLASMA
Lactic Acid, Venous: 2.5 mmol/L (ref 0.5–1.9)
Lactic Acid, Venous: 1.9 mmol/L (ref 0.5–1.9)

## 2020-03-23 MED ORDER — MORPHINE SULFATE (PF) 2 MG/ML IV SOLN
1.0000 mg | INTRAVENOUS | Status: DC | PRN
  Administered 2020-03-23: 1 mg via INTRAVENOUS
  Filled 2020-03-23: qty 1

## 2020-03-23 MED ORDER — PROCHLORPERAZINE EDISYLATE 10 MG/2ML IJ SOLN
10.0000 mg | Freq: Once | INTRAMUSCULAR | Status: AC
  Administered 2020-03-23: 10 mg via INTRAVENOUS
  Filled 2020-03-23: qty 2

## 2020-03-23 MED ORDER — MORPHINE SULFATE (PF) 4 MG/ML IV SOLN
4.0000 mg | Freq: Once | INTRAVENOUS | Status: AC
  Administered 2020-03-23: 4 mg via INTRAVENOUS
  Filled 2020-03-23: qty 1

## 2020-03-23 MED ORDER — IOHEXOL 300 MG/ML  SOLN
100.0000 mL | Freq: Once | INTRAMUSCULAR | Status: AC | PRN
  Administered 2020-03-23: 100 mL via INTRAVENOUS

## 2020-03-23 MED ORDER — HYDRALAZINE HCL 20 MG/ML IJ SOLN: 10.0000 mg | INTRAMUSCULAR | Status: DC | PRN

## 2020-03-23 MED ORDER — ACETAMINOPHEN 325 MG PO TABS: 650.0000 mg | ORAL_TABLET | Freq: Four times a day (QID) | ORAL | Status: DC | PRN

## 2020-03-23 MED ORDER — SODIUM CHLORIDE 0.9 % IV SOLN: INTRAVENOUS | Status: AC

## 2020-03-23 MED ORDER — MORPHINE SULFATE (PF) 2 MG/ML IV SOLN
2.0000 mg | INTRAVENOUS | Status: DC | PRN
  Administered 2020-03-23 – 2020-03-25 (×3): 2 mg via INTRAVENOUS
  Filled 2020-03-23 (×3): qty 1

## 2020-03-23 MED ORDER — METOPROLOL TARTRATE 5 MG/5ML IV SOLN
5.0000 mg | Freq: Four times a day (QID) | INTRAVENOUS | Status: DC
  Administered 2020-03-23 – 2020-03-24 (×3): 5 mg via INTRAVENOUS
  Filled 2020-03-23 (×3): qty 5

## 2020-03-23 MED ORDER — DIATRIZOATE MEGLUMINE & SODIUM 66-10 % PO SOLN
90.0000 mL | Freq: Once | ORAL | Status: AC
  Administered 2020-03-23: 90 mL via NASOGASTRIC
  Filled 2020-03-23: qty 90

## 2020-03-23 MED ORDER — ONDANSETRON HCL 4 MG/2ML IJ SOLN
4.0000 mg | Freq: Four times a day (QID) | INTRAMUSCULAR | Status: DC | PRN
  Administered 2020-03-23 – 2020-03-25 (×3): 4 mg via INTRAVENOUS
  Filled 2020-03-23 (×3): qty 2

## 2020-03-23 MED ORDER — METOCLOPRAMIDE HCL 5 MG/ML IJ SOLN
10.0000 mg | Freq: Once | INTRAMUSCULAR | Status: AC
  Administered 2020-03-23: 10 mg via INTRAVENOUS
  Filled 2020-03-23: qty 2

## 2020-03-23 MED ORDER — ACETAMINOPHEN 650 MG RE SUPP: 650.0000 mg | Freq: Four times a day (QID) | RECTAL | Status: DC | PRN

## 2020-03-23 MED ORDER — SODIUM CHLORIDE 0.9 % IV BOLUS
1000.0000 mL | Freq: Once | INTRAVENOUS | Status: AC
  Administered 2020-03-23: 1000 mL via INTRAVENOUS

## 2020-03-23 NOTE — Plan of Care (Signed)
  Problem: Elimination: Goal: Will not experience complications related to bowel motility Outcome: Progressing   

## 2020-03-23 NOTE — H&P (Signed)
History and Physical    Nathaniel Carr. MGQ:[id number] DOB: [birth year] DOA: 03/23/2020  PCP: Patient, [name] Patient coming from: Home  Chief Complaint: Abdominal pain, nausea  HPI: Nathaniel Carr. is a 54 y.o. male with medical history significant of hypertension, hyperlipidemia, anemia, chronic systolic CHF secondary to nonischemic cardiomyopathy presenting to the ED via EMS with complaints of sudden onset right upper quadrant abdominal pain associated with nausea and vomiting.  Hypertensive with systolic in the 200s with EMS.  Patient states he was doing fine during the day and symptoms started all of a sudden at night.  Reports having regular bowel movements.  Denies history of abdominal surgeries.  Denies fevers, chills, cough, shortness of breath, or chest pain.  ED Course: Afebrile.  Not tachycardic.  Blood pressure 181/100 on arrival.  Labs showing no leukocytosis.  Lipase normal.  T bili 1.7, remainder of LFTs normal.  Creatinine 1.4, no significant change from baseline.  Lactic acid 2.5.  UA not suggestive of infection.  SARS-CoV-2 PCR test pending.  CT showing dilated small bowel loops with no clear transition point.  NG tube placed and general surgery consulted.  Patient was given Reglan, morphine, Compazine, and 1 L normal saline bolus.  Review of Systems:  All systems reviewed and apart from history of presenting illness, are negative.  Past Medical History:  Diagnosis Date  . ANEMIA-NOS 12/05/2009  . DYSLIPIDEMIA 01/09/2010  . Headache(784.0) 12/05/2009  . Hemorrhoids   . HYPERTENSION 12/05/2009    Past Surgical History:  Procedure Laterality Date  . HEMORRHOID SURGERY    . RIGHT/LEFT HEART CATH AND CORONARY ANGIOGRAPHY N/A 02/24/2019   Procedure: RIGHT/LEFT HEART CATH AND CORONARY ANGIOGRAPHY;  Surgeon: Laurey Morale, MD;  Location: [facility] INVASIVE CV LAB;  Service: Cardiovascular;  Laterality: N/A;     reports that he has never smoked. He has never used  smokeless tobacco. He reports that he does not drink alcohol and does not use drugs.  Allergies  Allergen Reactions  . Shellfish-Derived Products Shortness Of Breath and Nausea And Vomiting    Can tolerate grilled shrimp  . Codeine Nausea Only  . Grass Extracts [Gramineae Pollens] Nausea Only  . Penicillins Nausea Only    Has patient had a PCN reaction causing immediate rash, facial/tongue/throat swelling, SOB or lightheadedness with hypotension: No Has patient had a PCN reaction causing severe rash involving mucus membranes or skin necrosis: No Has patient had a PCN reaction that required hospitalization: Unk Has patient had a PCN reaction occurring within the last 10 years: From childhood If all of the above answers are "NO", then may proc    Family History  Problem Relation Age of Onset  . Hypertension Paternal Grandfather     Prior to Admission medications   Medication Sig Start Date End Date Taking? Authorizing Provider  aspirin (SM ASPIRIN ADULT LOW STRENGTH) 81 MG EC tablet Take 1 tablet (81 mg total) by mouth daily. Swallow whole. 02/01/20   Laurey Morale, MD  aspirin-acetaminophen-caffeine [facility] MIGRAINE) [id number] MG per tablet Take 2 tablets by mouth every 6 (six) hours as needed for headache or migraine.     [provider]  atorvastatin (LIPITOR) 40 MG tablet Take 1 tablet (40 mg total) by mouth daily. 07/06/19   Laurey Morale, MD  carvedilol (COREG) 25 MG tablet Take 1 tablet (25 mg total) by mouth 2 (two) times daily with a meal. 12/07/19   Laurey Morale, MD  Cholecalciferol (VITAMIN  D-3 PO) Take 1 capsule by mouth 4 (four) times a week.     [provider]  dapagliflozin propanediol (FARXIGA) 10 MG TABS tablet Take 10 mg by mouth daily before breakfast. 02/02/20   Larey Dresser, MD  hydrALAZINE (APRESOLINE) 50 MG tablet Take 1.5 tablets (75 mg total) by mouth 3 (three) times daily. 11/03/19   Larey Dresser, MD  isosorbide mononitrate  (IMDUR) 60 MG 24 hr tablet Take 1.5 tablets (90 mg total) by mouth daily. 11/03/19   Larey Dresser, MD  sacubitril-valsartan (ENTRESTO) 97-103 MG Take 1 tablet by mouth 2 (two) times daily. 01/26/20   Larey Dresser, MD  spironolactone (ALDACTONE) 25 MG tablet Take 1 tablet (25 mg total) by mouth daily. 07/06/19   Larey Dresser, MD    Physical Exam: Vitals:   03/23/20 0232 03/23/20 0400 03/23/20 0415 03/23/20 0430  BP: (!) 181/100 (!) 193/120 (!) 176/124 (!) 183/122  Pulse: 64 63 64 69  Resp: 20     Temp:      TempSrc:      SpO2: 97% 99% 99% 99%  Weight:      Height:        Physical Exam Constitutional:      Appearance: He is well-developed.  HENT:     Head: Normocephalic.  Eyes:     Extraocular Movements: Extraocular movements intact.  Cardiovascular:     Rate and Rhythm: Normal rate and regular rhythm.     Heart sounds: Normal heart sounds.  Pulmonary:     Effort: Pulmonary effort is normal. No respiratory distress.     Breath sounds: Normal breath sounds. No wheezing or rales.  Abdominal:     General: Abdomen is protuberant. Bowel sounds are decreased.     Tenderness: There is no abdominal tenderness. There is no guarding.  Musculoskeletal:        General: No swelling.  Skin:    General: Skin is warm and dry.  Neurological:     Mental Status: He is alert and oriented to person, place, and time.     Labs on Admission: I have personally reviewed following labs and imaging studies  CBC: Recent Labs  Lab 03/23/20 0232  WBC 7.2  NEUTROABS 6.2  HGB 15.1  HCT 45.1  MCV 96.8  PLT 315   Basic Metabolic Panel: Recent Labs  Lab 03/23/20 0232  NA 141  K 4.2  CL 106  CO2 21*  GLUCOSE 144*  BUN 13  CREATININE 1.42*  CALCIUM 10.1   GFR: Estimated Creatinine Clearance: 65.5 mL/min (A) (by C-G formula based on SCr of 1.42 mg/dL (H)). Liver Function Tests: Recent Labs  Lab 03/23/20 0232  AST 34  ALT 27  ALKPHOS 44  BILITOT 1.7*  PROT 7.7  ALBUMIN  4.8   Recent Labs  Lab 03/23/20 0232  LIPASE 23   No results for input(s): AMMONIA in the last 168 hours. Coagulation Profile: No results for input(s): INR, PROTIME in the last 168 hours. Cardiac Enzymes: No results for input(s): CKTOTAL, CKMB, CKMBINDEX, TROPONINI in the last 168 hours. BNP (last 3 results) No results for input(s): PROBNP in the last 8760 hours. HbA1C: No results for input(s): HGBA1C in the last 72 hours. CBG: No results for input(s): GLUCAP in the last 168 hours. Lipid Profile: No results for input(s): CHOL, HDL, LDLCALC, TRIG, CHOLHDL, LDLDIRECT in the last 72 hours. Thyroid Function Tests: No results for input(s): TSH, T4TOTAL, FREET4, T3FREE, THYROIDAB in the last  72 hours. Anemia Panel: No results for input(s): VITAMINB12, FOLATE, FERRITIN, TIBC, IRON, RETICCTPCT in the last 72 hours. Urine analysis:    Component Value Date/Time   COLORURINE YELLOW 03/23/2020 0343   APPEARANCEUR CLEAR 03/23/2020 0343   LABSPEC 1.024 03/23/2020 0343   PHURINE 7.0 03/23/2020 0343   GLUCOSEU 50 (A) 03/23/2020 0343   HGBUR SMALL (A) 03/23/2020 0343   BILIRUBINUR NEGATIVE 03/23/2020 0343   KETONESUR 20 (A) 03/23/2020 0343   PROTEINUR 100 (A) 03/23/2020 0343   NITRITE NEGATIVE 03/23/2020 0343   LEUKOCYTESUR NEGATIVE 03/23/2020 0343    Radiological Exams on Admission: CT ABDOMEN PELVIS W CONTRAST  Result Date: 03/23/2020 CLINICAL DATA:  Right upper quadrant pain, nausea, vomiting EXAM: CT ABDOMEN AND PELVIS WITH CONTRAST TECHNIQUE: Multidetector CT imaging of the abdomen and pelvis was performed using the standard protocol following bolus administration of intravenous contrast. CONTRAST:  OMNIPAQUE IOHEXOL 300 MG/ML  SOLN COMPARISON:  None. FINDINGS: Lower chest: Lung bases are clear. No effusions. Heart is normal size. Hepatobiliary: Diffuse fatty infiltration of the liver. No focal hepatic abnormality. Gallbladder unremarkable. Pancreas: No focal abnormality or  ductal dilatation. Spleen: No focal abnormality.  Normal size. Adrenals/Urinary Tract: No adrenal abnormality. No focal renal abnormality. No stones or hydronephrosis. Urinary bladder is unremarkable. Stomach/Bowel: Colonic diverticulosis. No active diverticulitis. Normal appendix. Mildly prominent fluid-filled mid abdominal small bowel loops. Exact transition is not visualized, but distal small bowel loops are decompressed. There is edema within the r small bowel mesentery involving both dilated and nondilated bowel loops. Vascular/Lymphatic: Aortic atherosclerosis. No enlarged abdominal or pelvic lymph nodes. Reproductive: No visible focal abnormality. Other: No free fluid or free air. Musculoskeletal: No acute bony abnormality. IMPRESSION: Diffuse fatty infiltration of the liver. Prominent dilated fluid-filled small bowel loops in the mid and lower abdomen. Distal small bowel loops are decompressed. Exact transition is not visualized, there is edema within the small bowel mesentery. Findings are concerning for small bowel obstruction. Exact cause not visualize, but given the edema within the small bowel mesentery, cannot exclude vascular compromise, possibly from internal hernia. Electronically Signed   By: Charlett Nose M.D.   On: 03/23/2020 03:10    EKG: Independently reviewed.  Sinus rhythm, T wave inversions seen on prior tracing as well.  Assessment/Plan Principal Problem:   SBO (small bowel obstruction) (HCC) Active Problems:   Hypertensive urgency   Bilirubinemia   CHF (congestive heart failure) (HCC)   Possible SBO: CT showing dilated small bowel loops with no clear transition point.  No prior history of abdominal surgeries.  No signs of peritonitis.  Mild lactic acidosis likely related to abdominal pathology.  No fever, leukocytosis, or signs of sepsis.  NG tube placed in the ED.  Patient has been seen by general surgery and recommendation is to obtain cardiology evaluation given history of  CHF in case patient does not improve and needs exploratory laparotomy. -Keep n.p.o. NG tube placed.  Gentle IV fluid hydration, pain management, and antiemetic as needed.  Serial abdominal exams.  Hypertensive urgency: Systolic in the 180s at present. -Hydralazine as needed for SBP >170, manage pain as it is also likely contributing  Hyperbilirubinemia: T bili 1.7, remainder of LFTs normal.  T bili was 2.0 in May 2020. -Check fractionated bilirubin levels  Chronic systolic CHF: LVEF 40 to 45% on echo done November 2020.  Appears euvolemic at this time. -Consult cardiology in a.m. as mentioned above.  DVT prophylaxis: SCDs Code Status: Full code Family Communication: No family available  this time. Disposition Plan: Status is: Inpatient  Remains inpatient appropriate because:Ongoing active pain requiring inpatient pain management and IV treatments appropriate due to intensity of illness or inability to take PO   Dispo: The patient is from: Home              Anticipated d/c is to: Home              Anticipated d/c date is: > 3 days              Patient currently is not medically stable to d/c.  The medical decision making on this patient was of high complexity and the patient is at high risk for clinical deterioration, therefore this is a level 3 visit.  John Giovanni MD [facility]  If 7PM-7AM, please contact night-coverage [email or web address]  03/23/2020, 5:13 AM
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
## 2020-03-23 NOTE — Progress Notes (Signed)
PROGRESS NOTE    Nathaniel Carr.  QMV:[id number] DOB: [birth year] DOA: 03/23/2020 PCP: Patient, [name]   Brief Narrative:  Nathaniel Carr. is a 54 y.o. male with medical history significant of hypertension, hyperlipidemia, anemia, chronic systolic CHF secondary to nonischemic cardiomyopathy presenting to the ED via EMS with complaints of sudden onset right upper quadrant abdominal pain associated with nausea and vomiting.  Hypertensive with systolic in the 200s with EMS.  Patient states he was doing fine during the day and symptoms started all of a sudden at night.  Reports having regular bowel movements.  Denies history of abdominal surgeries.  Denies fevers, chills, cough, shortness of breath, or chest pain. In ED: Patient was found to be afebrile, non-tachycardic.  Blood pressure 181/100 on arrival.  Labs showing no leukocytosis.  Lipase normal.  T bili 1.7, remainder of LFTs normal.  Creatinine 1.4, no significant change from baseline.  Lactic acid 2.5.  UA not suggestive of infection.  SARS-CoV-2 PCR test negative. CT showing dilated small bowel loops with no clear transition point. NG tube placed and general surgery consulted. Patient was given Reglan, morphine, Compazine, and 1 L normal saline bolus.  Hospitalist was called to admit.   Assessment & Plan:   Principal Problem:   SBO (small bowel obstruction) (HCC) Active Problems:   Hypertensive urgency   Bilirubinemia   CHF (congestive heart failure) (HCC)  Possible SBO, POA:  - CT showing dilated small bowel loops with no clear transition point.  No prior history of abdominal surgeries.  No signs of peritonitis.  Mild lactic acidosis likely related to abdominal pathology.   - NG tube placed in the ED 700 cc output.   -General surgery following, appreciate insight and recommendations - Keep n.p.o. NG tube placed for decompression, continue IV fluids given poor p.o. status and as needed IV medications for pain and  nausea  Hypertensive urgency:  -In the setting of above systolic as high as 180 at admission -Blood pressure downtrending appropriately with pain control  Hyperbilirubinemia:  - T bili 1.7, remainder of LFTs normal.  T bili was 2.0 in May 2020. - Majority of bilirubin is indirect  Chronic systolic CHF:  - LVEF 40 to 45% on echo done November 2020.  Appears euvolemic at this time.  DVT prophylaxis: SCDs Code Status: Full code Family Communication: No family available this time. Disposition Plan:  Status is: Inpatient Dispo: The patient is from: Home              Anticipated d/c is to: Home              Anticipated d/c date is: 48 to 72 hours pending clinical course              Patient currently not medically stable for discharge due to ongoing need for work-up, possible surgical intervention and evaluation.  Consultants:   General surgery, cardiology  Procedures:   Pending  Antimicrobials:  Not currently indicated  Subjective: Abdominal pain nausea vomiting have remarkably improved with NG tube placement and current therapy.  Denies headache, fever, chills, chest pain, shortness of breath.  Objective: Vitals:   03/23/20 0400 03/23/20 0415 03/23/20 0430 03/23/20 0547  BP: (!) 193/120 (!) 176/124 (!) 183/122 (!) 148/99  Pulse: 63 64 69 77  Resp:    16  Temp:    98.8 F (37.1 C)  TempSrc:    Oral  SpO2: 99% 99% 99% 97%  Weight:  Height:        Intake/Output Summary (Last 24 hours) at 03/23/2020 1515 Last data filed at 03/23/2020 1255 Gross per 24 hour  Intake --  Output 700 ml  Net -700 ml   Filed Weights   03/23/20 0231  Weight: 85.3 kg    Examination:  General:  Pleasantly resting in bed, No acute distress. HEENT:  Normocephalic atraumatic.  Sclerae nonicteric, noninjected.  Extraocular movements intact bilaterally. Neck:  Without mass or deformity.  Trachea is midline. Lungs:  Clear to auscultate bilaterally without rhonchi, wheeze, or  rales. Heart:  Regular rate and rhythm.  Without murmurs, rubs, or gallops. Abdomen:  Soft, minimally tender, nondistended.  Without guarding or rebound. Extremities: Without cyanosis, clubbing, edema, or obvious deformity. Vascular:  Dorsalis pedis and posterior tibial pulses palpable bilaterally. Skin:  Warm and dry, no erythema, no ulcerations.   Data Reviewed: I have personally reviewed following labs and imaging studies  CBC: Recent Labs  Lab 03/23/20 0232  WBC 7.2  NEUTROABS 6.2  HGB 15.1  HCT 45.1  MCV 96.8  PLT 246   Basic Metabolic Panel: Recent Labs  Lab 03/23/20 0232  NA 141  K 4.2  CL 106  CO2 21*  GLUCOSE 144*  BUN 13  CREATININE 1.42*  CALCIUM 10.1   GFR: Estimated Creatinine Clearance: 65.5 mL/min (A) (by C-G formula based on SCr of 1.42 mg/dL (H)). Liver Function Tests: Recent Labs  Lab 03/23/20 0232 03/23/20 0840  AST 34  --   ALT 27  --   ALKPHOS 44  --   BILITOT 1.7* 1.4*  PROT 7.7  --   ALBUMIN 4.8  --    Recent Labs  Lab 03/23/20 0232  LIPASE 23   No results for input(s): AMMONIA in the last 168 hours. Coagulation Profile: No results for input(s): INR, PROTIME in the last 168 hours. Cardiac Enzymes: No results for input(s): CKTOTAL, CKMB, CKMBINDEX, TROPONINI in the last 168 hours. BNP (last 3 results) No results for input(s): PROBNP in the last 8760 hours. HbA1C: No results for input(s): HGBA1C in the last 72 hours. CBG: No results for input(s): GLUCAP in the last 168 hours. Lipid Profile: No results for input(s): CHOL, HDL, LDLCALC, TRIG, CHOLHDL, LDLDIRECT in the last 72 hours. Thyroid Function Tests: No results for input(s): TSH, T4TOTAL, FREET4, T3FREE, THYROIDAB in the last 72 hours. Anemia Panel: No results for input(s): VITAMINB12, FOLATE, FERRITIN, TIBC, IRON, RETICCTPCT in the last 72 hours. Sepsis Labs: Recent Labs  Lab 03/23/20 0301 03/23/20 0510  LATICACIDVEN 2.5* 1.9    Recent Results (from the past 240  hour(s))  SARS Coronavirus 2 by RT PCR (hospital order, performed in [facility] hospital lab) Nasopharyngeal Nasopharyngeal Swab     Status: None   Collection Time: 03/23/20  3:43 AM   Specimen: Nasopharyngeal Swab  Result Value Ref Range Status   SARS Coronavirus 2 NEGATIVE NEGATIVE Final    Comment: (NOTE) SARS-CoV-2 target nucleic acids are NOT DETECTED.  The SARS-CoV-2 RNA is generally detectable in upper and lower respiratory specimens during the acute phase of infection. The lowest concentration of SARS-CoV-2 viral copies this assay can detect is 250 copies / mL. A negative result does not preclude SARS-CoV-2 infection and should not be used as the sole basis for treatment or other patient management decisions.  A negative result may occur with improper specimen collection / handling, submission of specimen other than nasopharyngeal swab, presence of viral mutation(s) within the areas targeted by this  assay, and inadequate number of viral copies (<250 copies / mL). A negative result must be combined with clinical observations, patient history, and epidemiological information.  Fact Sheet for Patients:   StrictlyIdeas.no  Fact Sheet for Healthcare Providers: BankingDealers.co.za  This test is not yet approved or  cleared by the [place] FDA and has been authorized for detection and/or diagnosis of SARS-CoV-2 by FDA under an Emergency Use Authorization (EUA).  This EUA will remain in effect (meaning this test can be used) for the duration of the COVID-19 declaration under Section 564(b)(1) of the Act, 21 U.S.C. section 360bbb-3(b)(1), unless the authorization is terminated or revoked sooner.  Performed at [facility], [place] [address], [place], [place] [address]          Radiology Studies: CT ABDOMEN PELVIS W CONTRAST  Result Date: 03/23/2020 CLINICAL DATA:  Right upper quadrant pain, nausea, vomiting EXAM: CT  ABDOMEN AND PELVIS WITH CONTRAST TECHNIQUE: Multidetector CT imaging of the abdomen and pelvis was performed using the standard protocol following bolus administration of intravenous contrast. CONTRAST:  165mL OMNIPAQUE IOHEXOL 300 MG/ML  SOLN COMPARISON:  None. FINDINGS: Lower chest: Lung bases are clear. No effusions. Heart is normal size. Hepatobiliary: Diffuse fatty infiltration of the liver. No focal hepatic abnormality. Gallbladder unremarkable. Pancreas: No focal abnormality or ductal dilatation. Spleen: No focal abnormality.  Normal size. Adrenals/Urinary Tract: No adrenal abnormality. No focal renal abnormality. No stones or hydronephrosis. Urinary bladder is unremarkable. Stomach/Bowel: Colonic diverticulosis. No active diverticulitis. Normal appendix. Mildly prominent fluid-filled mid abdominal small bowel loops. Exact transition is not visualized, but distal small bowel loops are decompressed. There is edema within the r small bowel mesentery involving both dilated and nondilated bowel loops. Vascular/Lymphatic: Aortic atherosclerosis. No enlarged abdominal or pelvic lymph nodes. Reproductive: No visible focal abnormality. Other: No free fluid or free air. Musculoskeletal: No acute bony abnormality. IMPRESSION: Diffuse fatty infiltration of the liver. Prominent dilated fluid-filled small bowel loops in the mid and lower abdomen. Distal small bowel loops are decompressed. Exact transition is not visualized, there is edema within the small bowel mesentery. Findings are concerning for small bowel obstruction. Exact cause not visualize, but given the edema within the small bowel mesentery, cannot exclude vascular compromise, possibly from internal hernia. Electronically Signed   By: Rolm Baptise M.D.   On: 03/23/2020 03:10   DG Abd Portable 1V-Small Bowel Protocol-Position Verification  Result Date: 03/23/2020 CLINICAL DATA:  Nasogastric tube placement EXAM: PORTABLE ABDOMEN - 1 VIEW COMPARISON:  Earlier  today FINDINGS: Nasogastric tube with tip and side-port over the stomach. Contrast is being excreted from the kidneys. No interval finding from prior CT. IMPRESSION: Nasogastric tube in good position. Electronically Signed   By: Monte Fantasia M.D.   On: 03/23/2020 05:15    Scheduled Meds: Continuous Infusions:  sodium chloride 75 mL/hr at 03/23/20 0557     LOS: 0 days   Time spent: 49min  Cleave Ternes C Kawena Lyday, DO [facility]  If 7PM-7AM, please contact night-coverage [email or web address]  03/23/2020, 3:15 PM

## 2020-03-23 NOTE — ED Triage Notes (Signed)
Pt BIB [facility] from home with ABD Pain.  Pain began approx. 2200, 6/15. Pain was sudden in RUQ and progressively getting worse with N/V as well.   Pt is Hypertensive (200s sys with EMS)  4 mg Zofran given by EMS  All other VSS stable.

## 2020-03-23 NOTE — Consult Note (Addendum)
Cardiology Consultation:   Patient ID: Nathaniel Carr. MRN: [id number]; DOB: 1966-10-02  Admit date: 03/23/2020 Date of Consult: 03/23/2020  Primary Care Provider: Patient, [name] [facility] Cardiologist: No primary care provider on file. Dr. Shirlee Latch [facility] Electrophysiologist:  None    Patient Profile:   Nathaniel Kainz. is a 54 y.o. male with a hx of HTN, HLD, anemia and no known cardiac hx. But NICM with EF 20-25% though improved with meds to 40-45% who is being seen today for the evaluation of pre-op eval for possible exploratory laparotomy if pt does not improve at the request of Dr. Corliss Skains and Dr. Natale Milch.  History of Present Illness:   Nathaniel Carr originally diagnosed with NICM 02/2019 and diuresed.  EF 20-25%, cardiac cath with no CAD and preserved cardiac output.  Cardiac MRI in 5/20 showed LVEF 17%, RV normal. Unfortunately, delayed enhancement images were uninterpretable.   Follow up echo 06/2019 with EF 25-30% with mild LV dilatation but by 08/2019 EF 40-45% , mild LVH, mildly decreased RV systolic function, mild-moderate MR.  CPX (11/20): peak VO2 23.7, VE/VCO2 slope 41, RER 1.1.  Mild-moderate HF limitation, restrictive lung physiology likely due to body habitus.  On coreg 25 BID, entresto 97/103 BID, spironolactone 25 daly hydralazine 75 TID and imdur 90 mg daily.  fapagliflozin 10 mg daily was added. On statin for high LDL  Now admitted 03/22/20 with presentation of sudden Rt upper quad abd pain began late last night. + Nausea,  No fevers, cough colds or SOB and no chest pain, + hypertensive 181/100    CT showing dilated small bowel loops with no clear transition point, NG placed and surgery has seen for possible need for expl. Lap.    He is sedated but answers questions and his wife is in room.  No recent chest pain or SOB, no having to sleep on more pillows.  Has been taking all his meds. No rapid HR no syncope.   EKG:  The EKG was personally  reviewed and demonstrates:  SR with deep T wave inversion in inf lat leads same in 02/01/20 and similar to 05/04/19.  Telemetry:  Telemetry was personally reviewed and demonstrates:  SR  Na 141, K+ 4.2 Cr 1.42, total bili 1.7 indirect bili 1.2 LFTs normal Hgb 15.1 hct 45 WBC 7.2   No CXR but CTA of abd  IMPRESSION: Lungs were clear, heart size normal. Diffuse fatty infiltration of the liver.  Prominent dilated fluid-filled small bowel loops in the mid and lower abdomen. Distal small bowel loops are decompressed. Exact transition is not visualized, there is edema within the small bowel mesentery. Findings are concerning for small bowel obstruction. Exact cause not visualize, but given the edema within the small bowel mesentery, cannot exclude vascular compromise, possibly from internal hernia.  Past Medical History:  Diagnosis Date  . ANEMIA-NOS 12/05/2009  . DYSLIPIDEMIA 01/09/2010  . Headache(784.0) 12/05/2009  . Hemorrhoids   . HYPERTENSION 12/05/2009  . NICM (nonischemic cardiomyopathy) [facility])     Past Surgical History:  Procedure Laterality Date  . HEMORRHOID SURGERY    . RIGHT/LEFT HEART CATH AND CORONARY ANGIOGRAPHY N/A 02/24/2019   Procedure: RIGHT/LEFT HEART CATH AND CORONARY ANGIOGRAPHY;  Surgeon: Laurey Morale, MD;  Location: [facility] INVASIVE CV LAB;  Service: Cardiovascular;  Laterality: N/A;     Home Medications:  Prior to Admission medications   Medication Sig Start Date End Date Taking? Authorizing Provider  aspirin (SM ASPIRIN ADULT LOW STRENGTH)  81 MG EC tablet Take 1 tablet (81 mg total) by mouth daily. Swallow whole. 02/01/20  Yes Laurey Morale, MD  aspirin-acetaminophen-caffeine [id number] MIGRAINE) [phone] MG per tablet Take 2 tablets by mouth every 6 (six) hours as needed for headache or migraine.    Yes [provider]  atorvastatin (LIPITOR) 40 MG tablet Take 1 tablet (40 mg total) by mouth daily. 07/06/19  Yes Laurey Morale, MD  carvedilol  (COREG) 25 MG tablet Take 1 tablet (25 mg total) by mouth 2 (two) times daily with a meal. 12/07/19  Yes Laurey Morale, MD  Cholecalciferol (VITAMIN D-3 PO) Take 1 capsule by mouth 4 (four) times a week.    Yes [provider]  dapagliflozin propanediol (FARXIGA) 10 MG TABS tablet Take 10 mg by mouth daily before breakfast. 02/02/20  Yes Laurey Morale, MD  hydrALAZINE (APRESOLINE) 50 MG tablet Take 1.5 tablets (75 mg total) by mouth 3 (three) times daily. 11/03/19  Yes Laurey Morale, MD  isosorbide mononitrate (IMDUR) 60 MG 24 hr tablet Take 1.5 tablets (90 mg total) by mouth daily. 11/03/19  Yes Laurey Morale, MD  sacubitril-valsartan (ENTRESTO) 97-103 MG Take 1 tablet by mouth 2 (two) times daily. 01/26/20  Yes Laurey Morale, MD  spironolactone (ALDACTONE) 25 MG tablet Take 1 tablet (25 mg total) by mouth daily. 07/06/19  Yes Laurey Morale, MD    Inpatient Medications: Scheduled Meds:  Continuous Infusions: . sodium chloride 75 mL/hr at 03/23/20 0557   PRN Meds: acetaminophen **OR** acetaminophen, hydrALAZINE, morphine injection, ondansetron (ZOFRAN) IV  Allergies:    Allergies  Allergen Reactions  . Shellfish-Derived Products Shortness Of Breath and Nausea And Vomiting    Can tolerate grilled shrimp  . Codeine Nausea Only  . Grass Extracts [Gramineae Pollens] Nausea Only  . Penicillins Nausea Only    Has patient had a PCN reaction causing immediate rash, facial/tongue/throat swelling, SOB or lightheadedness with hypotension: No Has patient had a PCN reaction causing severe rash involving mucus membranes or skin necrosis: No Has patient had a PCN reaction that required hospitalization: Unk Has patient had a PCN reaction occurring within the last 10 years: From childhood If all of the above answers are "NO", then may proc    Social History:   Social History   Socioeconomic History  . Marital status: Married    Spouse name: Not on file  . Number of children:  Not on file  . Years of education: Not on file  . Highest education level: Not on file  Occupational History  . Not on file  Tobacco Use  . Smoking status: Never Smoker  . Smokeless tobacco: Never Used  . 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 Alcohol use: No  . Drug use: No  . Sexual activity: Yes  Other Topics Concern  . Not on file  Social History Narrative  . Not on file   Social Determinants of Health   Financial Resource Strain:   . Difficulty of Paying Living Expenses:   Food Insecurity:   . Worried About Programme researcher, broadcasting/film/video in the Last Year:   . Barista in the Last Year:   Transportation Needs:   . Freight forwarder (Medical):   Marland Kitchen Lack of Transportation (Non-Medical):   Physical Activity:   . Days of Exercise per Week:   . Minutes of Exercise per Session:  Stress:   . Feeling of Stress :   Social Connections:   . Frequency of Communication with Friends and Family:   . Frequency of Social Gatherings with Friends and Family:   . Attends Religious Services:   . Active Member of Clubs or Organizations:   . Attends Banker Meetings:   Marland Kitchen Marital Status:   Intimate Partner Violence:   . Fear of Current or Ex-Partner:   . Emotionally Abused:   Marland Kitchen Physically Abused:   . Sexually Abused:     Family History:    Family History  Problem Relation Age of Onset  . Hypertension Paternal Grandfather      ROS:  Please see the history of present illness.  General:no colds or fevers, no weight changes Skin:no rashes or ulcers HEENT:no blurred vision, no congestion CV:see HPI PUL:see HPI GI:no diarrhea constipation or melena, no indigestion + abd pain GU:no hematuria, no dysuria MS:no joint pain, no claudication Neuro:no syncope, no lightheadedness Endo:no diabetes, no thyroid disease  All other ROS reviewed and negative.     Physical Exam/Data:   Vitals:   03/23/20  0415 03/23/20 0430 03/23/20 0547 03/23/20 1610  BP: (!) 176/124 (!) 183/122 (!) 148/99 (!) 171/102  Pulse: 64 69 77 70  Resp:   16 14  Temp:   98.8 F (37.1 C) 99.5 F (37.5 C)  TempSrc:   Oral Oral  SpO2: 99% 99% 97% 100%  Weight:      Height:        Intake/Output Summary (Last 24 hours) at 03/23/2020 1650 Last data filed at 03/23/2020 1300 Gross per 24 hour  Intake 0 ml  Output 1150 ml  Net -1150 ml   Last 3 Weights 03/23/2020 02/01/2020 12/07/2019  Weight (lbs) 188 lb 196 lb 198 lb  Weight (kg) 85.276 kg 88.905 kg 89.812 kg     Body mass index is 28.59 kg/m.  General:  Well nourished, well developed, in no acute distress- sedated for pain but answers questions and wife tells me has has passed some gas. HEENT: normal Lymph: no adenopathy Neck: no JVD Endocrine:  No thryomegaly Vascular: No carotid bruits; pedal pulses 2+ bilaterally  Cardiac:  normal S1, S2; RRR; 1-2/6 systolic murmur no gallup rub or click Lungs:  clear to auscultation bilaterally, no wheezing, rhonchi or rales  Abd: soft, +tenderness, no hepatomegaly  Ext: no edema Musculoskeletal:  No deformities, BUE and BLE strength normal and equal Skin: warm and dry  Neuro:  Alert and oriented X 3 and sleepy  no focal abnormalities noted Psych:  Normal affect   Relevant CV Studies:  CPX (11/20): peak VO2 23.7, VE/VCO2 slope 41, RER 1.1.  Mild-moderate HF limitation, restrictive lung physiology likely due to body habitus.  TTE 08/17/2019 IMPRESSIONS    1. Left ventricular ejection fraction, by visual estimation, is 40 to  45%. The left ventricle has mild to moderately decreased function. There  is mildly increased left ventricular hypertrophy.  2. Left ventricular diastolic parameters are consistent with Grade I  diastolic dysfunction (impaired relaxation).  3. Global right ventricle has mildly reduced systolic function.The right  ventricular size is normal. No increase in right ventricular wall  thickness.   4. Left atrial size was normal.  5. Right atrial size was normal.  6. The mitral valve is abnormal. Mild to moderate mitral valve  regurgitation.  7. The tricuspid valve is grossly normal. Tricuspid valve regurgitation  is mild.  8. The aortic valve is tricuspid. Aortic  valve regurgitation is not  visualized. Mild aortic valve sclerosis without stenosis.  9. The pulmonic valve was grossly normal. Pulmonic valve regurgitation is  not visualized.  10. Normal pulmonary artery systolic pressure.  11. The inferior vena cava is normal in size with greater than 50%  respiratory variability, suggesting right atrial pressure of 3 mmHg.  12. Compared to a prior study on 06/17/2019, the LVEF has improved from  25-30% up to 40-45%   FINDINGS  Left Ventricle: Left ventricular ejection fraction, by visual estimation,  is 40 to 45%. The left ventricle has mild to moderately decreased  function. There is mildly increased left ventricular hypertrophy. Left  ventricular diastolic parameters are  consistent with Grade I diastolic dysfunction (impaired relaxation).  Indeterminate filling pressures.   Right Ventricle: The right ventricular size is normal. No increase in  right ventricular wall thickness. Global RV systolic function is has  mildly reduced systolic function. The tricuspid regurgitant velocity is  2.39 m/s, and with an assumed right atrial  pressure of 3 mmHg, the estimated right ventricular systolic pressure is  normal at 25.8 mmHg.   Left Atrium: Left atrial size was normal in size.   Right Atrium: Right atrial size was normal in size   Pericardium: There is no evidence of pericardial effusion.   Mitral Valve: The mitral valve is abnormal. There is mild thickening of  the mitral valve leaflet(s). MV Area by PHT, 4.80 cm. MV PHT, 45.82 msec.  Mild to moderate mitral valve regurgitation.   Tricuspid Valve: The tricuspid valve is grossly normal. Tricuspid valve    regurgitation is mild.   Aortic Valve: The aortic valve is tricuspid. Aortic valve regurgitation is  not visualized. Mild aortic valve sclerosis is present, with no evidence  of aortic valve stenosis.   Pulmonic Valve: The pulmonic valve was grossly normal. Pulmonic valve  regurgitation is not visualized.   Aorta: The aortic root and ascending aorta are structurally normal, with  no evidence of dilitation.   Venous: The inferior vena cava is normal in size with greater than 50%  respiratory variability, suggesting right atrial pressure of 3 mmHg.   Shunts: No atrial level shunt detected by color flow Doppler.    cardiac cath 02/2019 1. Optimized filling pressures.  2. Preserved cardiac output.  3. No significant coronary disease.  Nonischemic cardiomyopathy   Laboratory Data:  High Sensitivity Troponin:  No results for input(s): TROPONINIHS in the last 720 hours.   Chemistry Recent Labs  Lab 03/23/20 0232  NA 141  K 4.2  CL 106  CO2 21*  GLUCOSE 144*  BUN 13  CREATININE 1.42*  CALCIUM 10.1  GFRNONAA 57*  GFRAA >60  ANIONGAP 14    Recent Labs  Lab 03/23/20 0232 03/23/20 0840  PROT 7.7  --   ALBUMIN 4.8  --   AST 34  --   ALT 27  --   ALKPHOS 44  --   BILITOT 1.7* 1.4*   Hematology Recent Labs  Lab 03/23/20 0232  WBC 7.2  RBC 4.66  HGB 15.1  HCT 45.1  MCV 96.8  MCH 32.4  MCHC 33.5  RDW 12.9  PLT 246   BNPNo results for input(s): BNP, PROBNP in the last 168 hours.  DDimer No results for input(s): DDIMER in the last 168 hours.   Radiology/Studies:  CT ABDOMEN PELVIS W CONTRAST  Result Date: 03/23/2020 CLINICAL DATA:  Right upper quadrant pain, nausea, vomiting EXAM: CT ABDOMEN AND PELVIS WITH CONTRAST TECHNIQUE:  Multidetector CT imaging of the abdomen and pelvis was performed using the standard protocol following bolus administration of intravenous contrast. CONTRAST:  OMNIPAQUE IOHEXOL 300 MG/ML  SOLN COMPARISON:  None. FINDINGS: Lower  chest: Lung bases are clear. No effusions. Heart is normal size. Hepatobiliary: Diffuse fatty infiltration of the liver. No focal hepatic abnormality. Gallbladder unremarkable. Pancreas: No focal abnormality or ductal dilatation. Spleen: No focal abnormality.  Normal size. Adrenals/Urinary Tract: No adrenal abnormality. No focal renal abnormality. No stones or hydronephrosis. Urinary bladder is unremarkable. Stomach/Bowel: Colonic diverticulosis. No active diverticulitis. Normal appendix. Mildly prominent fluid-filled mid abdominal small bowel loops. Exact transition is not visualized, but distal small bowel loops are decompressed. There is edema within the r small bowel mesentery involving both dilated and nondilated bowel loops. Vascular/Lymphatic: Aortic atherosclerosis. No enlarged abdominal or pelvic lymph nodes. Reproductive: No visible focal abnormality. Other: No free fluid or free air. Musculoskeletal: No acute bony abnormality. IMPRESSION: Diffuse fatty infiltration of the liver. Prominent dilated fluid-filled small bowel loops in the mid and lower abdomen. Distal small bowel loops are decompressed. Exact transition is not visualized, there is edema within the small bowel mesentery. Findings are concerning for small bowel obstruction. Exact cause not visualize, but given the edema within the small bowel mesentery, cannot exclude vascular compromise, possibly from internal hernia. Electronically Signed   By: Charlett Nose M.D.   On: 03/23/2020 03:10   DG Abd Portable 1V-Small Bowel Obstruction Protocol-initial, 8 hr delay  Result Date: 03/23/2020 CLINICAL DATA:  Small bowel obstructions EXAM: PORTABLE ABDOMEN - 1 VIEW COMPARISON:  03/23/2020 FINDINGS: Nasogastric tube is present. There is a small amount of inspissated barium in the stomach. Contrast medium is present in the urinary bladder, residual from recent CT examination. Most of the bowel is gasless. There is a mildly dilated loop of jejunum  measuring 3.4 cm in diameter in the left upper quadrant. Partially transitional L5 vertebra with pseudoarticulation of the broad left transverse process of L5 with the sacrum. IMPRESSION: 1. Mildly dilated loop of left upper quadrant jejunum at 3.4 cm. Most of bowel is gasless. 2. Small amount of inspissated oral contrast medium in the stomach. No orally administered contrast is recognized in the bowel. Nasogastric tube is present in the stomach. Electronically Signed   By: Gaylyn Rong M.D.   On: 03/23/2020 16:29   DG Abd Portable 1V-Small Bowel Protocol-Position Verification  Result Date: 03/23/2020 CLINICAL DATA:  Nasogastric tube placement EXAM: PORTABLE ABDOMEN - 1 VIEW COMPARISON:  Earlier today FINDINGS: Nasogastric tube with tip and side-port over the stomach. Contrast is being excreted from the kidneys. No interval finding from prior CT. IMPRESSION: Nasogastric tube in good position. Electronically Signed   By: Marnee Spring M.D.   On: 03/23/2020 05:15        No chest pain  Assessment and Plan:   1. Pre-op eval for possible exploratory Lap with SBO, NG in place.  Pt without CAD, he is moderate risk for surgery due to his NICM.  Dr. Jacques Navy to see.  2. Possible SBO NG in place hope will improve but if not exp lap.  3. NICM with last EF improved to 40-45% on medications.  The meds are on on hold due to NPO 4. Chronic systolic HF currently euvolemic 5. HTN uncontrolled, pain may be adding to this.  add IV BB every 6 Hours  Currently on prn hydralazine 6. HLD resume statin at discharge.       For questions or updates,  please contact [facility] Please consult [email or web address] for contact info under    Signed, Nada Boozer, NP  03/23/2020 4:50 PM   Patient seen and examined with Nada Boozer NP.  Agree as above, with the following exceptions and changes as noted below. Nathaniel Carr presents with signs of small bowel obstruction, currently being managed with NG tube to suction.   He has a history of nonischemic cardiomyopathy, with improvement in EF to 40-45% on goal-directed medical therapy.  Medical therapy includes carvedilol 25 mg twice daily, Entresto 97-103 mg twice daily, atorvastatin, Farxiga, hydralazine, Imdur, and spironolactone. Gen: NAD, CV: RRR, 2/6 holosystolic murmur heard across the precordium, Lungs: clear, Abd: Distended but not tense, Extrem: Warm, well perfused, no edema, Neuro/Psych: alert and oriented x 3, normal mood and affect. All available labs, radiology testing, previous records reviewed.    Patient is seen today with his wife.  Patient is quite sleepy but interacts appropriately and answers questions.  He is euvolemic, has had coronary angiography with no coronary artery disease, and cardiovascular status appears overall stable.  Surgical team is pursuing NG decompression,, patient feels slightly better on my exam.  We will continue to follow the patient in the perioperative period.  Would recommend resuming heart failure therapy when NG is no longer to suction, as he is on an excellent regimen and has had an improvement in his ejection fraction with therapy.  The patient is overall intermediate risk for general anesthesia.  He has stable cardiovascular status and is likely NYHA class I symptoms by report, however with his reduced ejection fraction and nonischemic cardiomyopathy, his risk is likely intermediate for MACE. Holosystolic murmur on exam, reasonable to check limited echocardiogram to evaluate mitral valve regurgitation.  Parke Poisson, MD 03/23/20 9:17 PM

## 2020-03-23 NOTE — ED Notes (Signed)
Pt transported to CT ?

## 2020-03-23 NOTE — Consult Note (Signed)
Reason for Consult: SBO Referring Physician: Theresa Dohrman. is an 54 y.o. male.  HPI: This is a 54 year old male with CHF and no previous abdominal surgery who presents with acute abdominal pain, nausea, and vomiting.  He had a bowel movement during the day on 6/15.  He was eating dinner and began having mid-abdominal pain.  Subsequently, he began having non-bloody emesis.  Mildly distended.  He presented to the ED for evaluation.  WBC normal.  Cr elevated.  CT shows a dilated stomach, mildly dilated proximal SB but no clear transition point.  There is some mild edema in the mesentery of the SB.  We are asked to consult on this patient.  Past Medical History:  Diagnosis Date  . ANEMIA-NOS 12/05/2009  . DYSLIPIDEMIA 01/09/2010  . Headache(784.0) 12/05/2009  . Hemorrhoids   . HYPERTENSION 12/05/2009  Non-ischemic cardiomyopathy - EF 20-25% per last cardiology note.  Past Surgical History:  Procedure Laterality Date  . HEMORRHOID SURGERY    . RIGHT/LEFT HEART CATH AND CORONARY ANGIOGRAPHY N/A 02/24/2019   Procedure: RIGHT/LEFT HEART CATH AND CORONARY ANGIOGRAPHY;  Surgeon: Larey Dresser, MD;  Location: [facility];  Service: Cardiovascular;  Laterality: N/A;    Family History  Problem Relation Age of Onset  . Hypertension Paternal Grandfather     Social History:  reports that he has never smoked. He has never used smokeless tobacco. He reports that he does not drink alcohol and does not use drugs.  Allergies:  Allergies  Allergen Reactions  . Shellfish-Derived Products Shortness Of Breath and Nausea And Vomiting    Can tolerate grilled shrimp  . Codeine Nausea Only  . Grass Extracts [Gramineae Pollens] Nausea Only  . Penicillins Nausea Only    Has patient had a PCN reaction causing immediate rash, facial/tongue/throat swelling, SOB or lightheadedness with hypotension: No Has patient had a PCN reaction causing severe rash involving mucus membranes or skin necrosis:  No Has patient had a PCN reaction that required hospitalization: Unk Has patient had a PCN reaction occurring within the last 10 years: From childhood If all of the above answers are "NO", then may proc    Medications:  Prior to Admission medications   Medication Sig Start Date End Date Taking? Authorizing Provider  aspirin (SM ASPIRIN ADULT LOW STRENGTH) 81 MG EC tablet Take 1 tablet (81 mg total) by mouth daily. Swallow whole. 02/01/20   Larey Dresser, MD  aspirin-acetaminophen-caffeine [facility] MIGRAINE) [id number] MG per tablet Take 2 tablets by mouth every 6 (six) hours as needed for headache or migraine.     [provider]  atorvastatin (LIPITOR) 40 MG tablet Take 1 tablet (40 mg total) by mouth daily. 07/06/19   Larey Dresser, MD  carvedilol (COREG) 25 MG tablet Take 1 tablet (25 mg total) by mouth 2 (two) times daily with a meal. 12/07/19   Larey Dresser, MD  Cholecalciferol (VITAMIN D-3 PO) Take 1 capsule by mouth 4 (four) times a week.     [provider]  dapagliflozin propanediol (FARXIGA) 10 MG TABS tablet Take 10 mg by mouth daily before breakfast. 02/02/20   Larey Dresser, MD  hydrALAZINE (APRESOLINE) 50 MG tablet Take 1.5 tablets (75 mg total) by mouth 3 (three) times daily. 11/03/19   Larey Dresser, MD  isosorbide mononitrate (IMDUR) 60 MG 24 hr tablet Take 1.5 tablets (90 mg total) by mouth daily. 11/03/19   Larey Dresser, MD  sacubitril-valsartan (  ENTRESTO) 97-103 MG Take 1 tablet by mouth 2 (two) times daily. 01/26/20   Laurey Morale, MD  spironolactone (ALDACTONE) 25 MG tablet Take 1 tablet (25 mg total) by mouth daily. 07/06/19   Laurey Morale, MD     Results for orders placed or performed during the hospital encounter of 03/23/20 (from the past 48 hour(s))  Comprehensive metabolic panel     Status: Abnormal   Collection Time: 03/23/20  2:32 AM  Result Value Ref Range   Sodium 141 135 - 145 mmol/L   Potassium 4.2 3.5 - 5.1 mmol/L    Chloride 106 98 - 111 mmol/L   CO2 21 (L) 22 - 32 mmol/L   Glucose, Bld 144 (H) 70 - 99 mg/dL    Comment: Glucose reference range applies only to samples taken after fasting for at least 8 hours.   BUN 13 6 - 20 mg/dL   Creatinine, Ser 4.19 (H) 0.61 - 1.24 mg/dL   Calcium 62.2 8.9 - 29.7 mg/dL   Total Protein 7.7 6.5 - 8.1 g/dL   Albumin 4.8 3.5 - 5.0 g/dL   AST 34 15 - 41 U/L   ALT 27 0 - 44 U/L   Alkaline Phosphatase 44 38 - 126 U/L   Total Bilirubin 1.7 (H) 0.3 - 1.2 mg/dL   GFR calc non Af Amer 57 (L) >60 mL/min   GFR calc Af Amer >60 >60 mL/min   Anion gap 14 5 - 15    Comment: Performed at [facility], [address] [place], [place] [address]  Lipase, blood     Status: None   Collection Time: 03/23/20  2:32 AM  Result Value Ref Range   Lipase 23 11 - 51 U/L    Comment: Performed at [facility], [address], [place], [place] [address]  CBC with Differential     Status: Abnormal   Collection Time: 03/23/20  2:32 AM  Result Value Ref Range   WBC 7.2 4.0 - 10.5 K/uL   RBC 4.66 4.22 - 5.81 MIL/uL   Hemoglobin 15.1 13.0 - 17.0 g/dL   HCT 40.8 39 - 52 %   MCV 96.8 80.0 - 100.0 fL   MCH 32.4 26.0 - 34.0 pg   MCHC 33.5 30.0 - 36.0 g/dL   RDW 14.4 81.8 - 56.3 %   Platelets 246 150 - 400 K/uL   nRBC 0.0 0.0 - 0.2 %   Neutrophils Relative % 87 %   Neutro Abs 6.2 1.7 - 7.7 K/uL   Lymphocytes Relative 9 %   Lymphs Abs 0.6 (L) 0.7 - 4.0 K/uL   Monocytes Relative 4 %   Monocytes Absolute 0.3 0 - 1 K/uL   Eosinophils Relative 0 %   Eosinophils Absolute 0.0 0 - 0 K/uL   Basophils Relative 0 %   Basophils Absolute 0.0 0 - 0 K/uL   Immature Granulocytes 0 %   Abs Immature Granulocytes 0.03 0.00 - 0.07 K/uL    Comment: Performed at [facility], [address] [place], [place] [address]  Lactic acid, plasma     Status: Abnormal   Collection Time: 03/23/20  3:01 AM  Result Value Ref Range   Lactic Acid, Venous 2.5 (HH) 0.5 - 1.9 mmol/L    Comment:  CRITICAL RESULT CALLED TO, READ BACK BY AND VERIFIED WITH: L.SHULAR,RN [id number] 03/23/2020 M.CAMPBELL Performed at [facility], [address], [place], [place] [address]   Urinalysis,  Routine w reflex microscopic     Status: Abnormal   Collection Time: 03/23/20  3:43 AM  Result Value Ref Range   Color, Urine YELLOW YELLOW   APPearance CLEAR CLEAR   Specific Gravity, Urine 1.024 1.005 - 1.030   pH 7.0 5.0 - 8.0   Glucose, UA 50 (A) NEGATIVE mg/dL   Hgb urine dipstick SMALL (A) NEGATIVE   Bilirubin Urine NEGATIVE NEGATIVE   Ketones, ur 20 (A) NEGATIVE mg/dL   Protein, ur 916 (A) NEGATIVE mg/dL   Nitrite NEGATIVE NEGATIVE   Leukocytes,Ua NEGATIVE NEGATIVE   RBC / HPF 11-20 0 - 5 RBC/hpf   WBC, UA 0-5 0 - 5 WBC/hpf   Bacteria, UA NONE SEEN NONE SEEN   Mucus PRESENT     Comment: Performed at [facility], [address] [place], [place] [address]    CT ABDOMEN PELVIS W CONTRAST  Result Date: 03/23/2020 CLINICAL DATA:  Right upper quadrant pain, nausea, vomiting EXAM: CT ABDOMEN AND PELVIS WITH CONTRAST TECHNIQUE: Multidetector CT imaging of the abdomen and pelvis was performed using the standard protocol following bolus administration of intravenous contrast. CONTRAST:  OMNIPAQUE IOHEXOL 300 MG/ML  SOLN COMPARISON:  None. FINDINGS: Lower chest: Lung bases are clear. No effusions. Heart is normal size. Hepatobiliary: Diffuse fatty infiltration of the liver. No focal hepatic abnormality. Gallbladder unremarkable. Pancreas: No focal abnormality or ductal dilatation. Spleen: No focal abnormality.  Normal size. Adrenals/Urinary Tract: No adrenal abnormality. No focal renal abnormality. No stones or hydronephrosis. Urinary bladder is unremarkable. Stomach/Bowel: Colonic diverticulosis. No active diverticulitis. Normal appendix. Mildly prominent fluid-filled mid abdominal small bowel loops. Exact transition is not visualized, but distal small bowel loops are decompressed. There is  edema within the r small bowel mesentery involving both dilated and nondilated bowel loops. Vascular/Lymphatic: Aortic atherosclerosis. No enlarged abdominal or pelvic lymph nodes. Reproductive: No visible focal abnormality. Other: No free fluid or free air. Musculoskeletal: No acute bony abnormality. IMPRESSION: Diffuse fatty infiltration of the liver. Prominent dilated fluid-filled small bowel loops in the mid and lower abdomen. Distal small bowel loops are decompressed. Exact transition is not visualized, there is edema within the small bowel mesentery. Findings are concerning for small bowel obstruction. Exact cause not visualize, but given the edema within the small bowel mesentery, cannot exclude vascular compromise, possibly from internal hernia. Electronically Signed   By: Charlett Nose M.D.   On: 03/23/2020 03:10    Review of Systems  HENT: Negative for ear discharge, ear pain, hearing loss and tinnitus.   Eyes: Negative for photophobia and pain.  Respiratory: Negative for cough and shortness of breath.   Cardiovascular: Negative for chest pain.  Gastrointestinal: Positive for abdominal distention, abdominal pain, nausea and vomiting.  Genitourinary: Negative for dysuria, flank pain, frequency and urgency.  Musculoskeletal: Negative for back pain, myalgias and neck pain.  Neurological: Negative for dizziness and headaches.  Hematological: Does not bruise/bleed easily.  Psychiatric/Behavioral: The patient is not nervous/anxious.    Blood pressure (!) 181/100, pulse 64, temperature 98.7 F (37.1 C), temperature source Axillary, resp. rate 20, height 5\' 8"  (1.727 m), weight 85.3 kg, SpO2 97 %. Physical Exam Constitutional:  WDWN in NAD, conversant, no obvious deformities; lying in bed comfortably Eyes:  Pupils equal, round; sclera anicteric; moist conjunctiva; no lid lag HENT:  Oral mucosa moist; good dentition  Neck:  No masses palpated, trachea midline; no thyromegaly Lungs:  CTA  bilaterally; normal respiratory effort CV:  Regular rate and rhythm; no  murmurs; extremities well-perfused with no edema Abd:  +bowel sounds, mildly distended; mildly tender in upper mid-abdomen; no peritoneal signs; no hernias noted Musc:  Unable to assess gait; no apparent clubbing or cyanosis in extremities Lymphatic:  No palpable cervical or axillary lymphadenopathy Skin:  Warm, dry; no sign of jaundice Psychiatric - alert and oriented x 4; calm mood and affect  Assessment/Plan: Nausea/ vomiting Dilated loops of small bowel with no clear transition point - possible SBO, but patient has had no previous abdominal surgery.  No signs of peritonitis CHF - nonischemic cardiomyopathy   Recs:  NG tube placement Cardiology evaluation - in case patient does not improve and needs exploratory laparotomy SBO protocol Wilmon Arms Aanshi Batchelder 03/23/2020, 4:29 AM

## 2020-03-24 ENCOUNTER — Inpatient Hospital Stay (HOSPITAL_COMMUNITY): Payer: 59

## 2020-03-24 DIAGNOSIS — I361 Nonrheumatic tricuspid (valve) insufficiency: Secondary | ICD-10-CM

## 2020-03-24 DIAGNOSIS — I340 Nonrheumatic mitral (valve) insufficiency: Secondary | ICD-10-CM

## 2020-03-24 LAB — CBC
HCT: 45.2 % (ref 39.0–52.0)
Hemoglobin: 15.4 g/dL (ref 13.0–17.0)
MCH: 32.5 pg (ref 26.0–34.0)
MCHC: 34.1 g/dL (ref 30.0–36.0)
MCV: 95.4 fL (ref 80.0–100.0)
Platelets: 223 10*3/uL (ref 150–400)
RBC: 4.74 MIL/uL (ref 4.22–5.81)
RDW: 13 % (ref 11.5–15.5)
WBC: 11 10*3/uL — ABNORMAL HIGH (ref 4.0–10.5)
nRBC: 0 % (ref 0.0–0.2)

## 2020-03-24 LAB — COMPREHENSIVE METABOLIC PANEL
ALT: 23 U/L (ref 0–44)
AST: 31 U/L (ref 15–41)
Albumin: 4.4 g/dL (ref 3.5–5.0)
Alkaline Phosphatase: 46 U/L (ref 38–126)
Anion gap: 13 (ref 5–15)
BUN: 13 mg/dL (ref 6–20)
CO2: 22 mmol/L (ref 22–32)
Calcium: 9.4 mg/dL (ref 8.9–10.3)
Chloride: 106 mmol/L (ref 98–111)
Creatinine, Ser: 1.36 mg/dL — ABNORMAL HIGH (ref 0.61–1.24)
GFR calc Af Amer: >60 mL/min (ref >60)
GFR calc non Af Amer: 60 mL/min — ABNORMAL LOW (ref >60)
Glucose, Bld: 134 mg/dL — ABNORMAL HIGH (ref 70–99)
Potassium: 3.7 mmol/L (ref 3.5–5.1)
Sodium: 141 mmol/L (ref 135–145)
Total Bilirubin: 1.5 mg/dL — ABNORMAL HIGH (ref 0.3–1.2)
Total Protein: 7.1 g/dL (ref 6.5–8.1)

## 2020-03-24 LAB — ECHOCARDIOGRAM COMPLETE
Height: 68 in
Weight: 3008 oz

## 2020-03-24 MED ORDER — SACUBITRIL-VALSARTAN 97-103 MG PO TABS
1.0000 | ORAL_TABLET | Freq: Two times a day (BID) | ORAL | Status: DC
  Administered 2020-03-24 – 2020-03-25 (×4): 1 via ORAL
  Filled 2020-03-24 (×6): qty 1

## 2020-03-24 MED ORDER — CARVEDILOL 25 MG PO TABS
25.0000 mg | ORAL_TABLET | Freq: Two times a day (BID) | ORAL | Status: DC
  Administered 2020-03-24 – 2020-03-25 (×3): 25 mg
  Filled 2020-03-24 (×3): qty 1

## 2020-03-24 MED ORDER — DAPAGLIFLOZIN PROPANEDIOL 10 MG PO TABS
10.0000 mg | ORAL_TABLET | Freq: Every day | ORAL | Status: DC
  Administered 2020-03-24 – 2020-03-25 (×2): 10 mg via ORAL
  Filled 2020-03-24 (×2): qty 1

## 2020-03-24 MED ORDER — HYDRALAZINE HCL 50 MG PO TABS: 75.0000 mg | ORAL_TABLET | Freq: Three times a day (TID) | ORAL | Status: DC

## 2020-03-24 MED ORDER — SACUBITRIL-VALSARTAN 97-103 MG PO TABS
1.0000 | ORAL_TABLET | Freq: Two times a day (BID) | ORAL | Status: DC
  Filled 2020-03-24: qty 1

## 2020-03-24 MED ORDER — DAPAGLIFLOZIN PROPANEDIOL 10 MG PO TABS
10.0000 mg | ORAL_TABLET | Freq: Every day | ORAL | Status: DC
  Filled 2020-03-24: qty 1

## 2020-03-24 MED ORDER — ATORVASTATIN CALCIUM 40 MG PO TABS: 40.0000 mg | ORAL_TABLET | Freq: Every day | ORAL | Status: DC

## 2020-03-24 MED ORDER — HYDRALAZINE HCL 50 MG PO TABS
75.0000 mg | ORAL_TABLET | Freq: Three times a day (TID) | ORAL | Status: DC
  Administered 2020-03-24 – 2020-03-25 (×4): 75 mg
  Filled 2020-03-24 (×4): qty 1

## 2020-03-24 MED ORDER — ATORVASTATIN CALCIUM 40 MG PO TABS
40.0000 mg | ORAL_TABLET | Freq: Every day | ORAL | Status: DC
  Administered 2020-03-24 – 2020-03-25 (×2): 40 mg
  Filled 2020-03-24 (×2): qty 1

## 2020-03-24 MED ORDER — SPIRONOLACTONE 25 MG PO TABS
25.0000 mg | ORAL_TABLET | Freq: Every day | ORAL | Status: DC
  Administered 2020-03-24 – 2020-03-25 (×2): 25 mg
  Filled 2020-03-24 (×2): qty 1

## 2020-03-24 MED ORDER — CARVEDILOL 25 MG PO TABS: 25.0000 mg | ORAL_TABLET | Freq: Two times a day (BID) | ORAL | Status: DC

## 2020-03-24 MED ORDER — SPIRONOLACTONE 25 MG PO TABS: 25.0000 mg | ORAL_TABLET | Freq: Every day | ORAL | Status: DC

## 2020-03-24 NOTE — Progress Notes (Signed)
Echocardiogram 2D Echocardiogram has been performed.  Nathaniel Carr Nathaniel Carr 03/24/2020, 10:36 AM

## 2020-03-24 NOTE — Progress Notes (Signed)
Patient ID: Nathaniel Carr., male   DOB: [birth year], 54 y.o.   MRN: [id number]       Subjective: Abdominal pain much better today.  Passed some flatus this am.  NGT with mostly just spit type output currently.  Getting an ECHO currently  ROS: See above, otherwise other systems negative  Objective: Vital signs in last 24 hours: Temp:  [98.9 F (37.2 C)-99.5 F (37.5 C)] 98.9 F (37.2 C) (06/17 0541) Pulse Rate:  [68-78] 78 (06/17 0541) Resp:  [14-18] 18 (06/17 0541) BP: (163-172)/(102-117) 163/117 (06/17 0541) SpO2:  [98 %-100 %] 98 % (06/17 0541)    Intake/Output from previous day: 06/16 0701 - 06/17 0700 In: 786.4 [I.V.:786.4] Out: 1650 [Urine:450; Emesis/NG output:1200] Intake/Output this shift: No intake/output data recorded.  PE: Abd: soft, NT, ND, few BS, NGT with minimal clear type output  Lab Results:  Recent Labs    03/23/20 0232 03/24/20 0237  WBC 7.2 11.0*  HGB 15.1 15.4  HCT 45.1 45.2  PLT 246 223   BMET Recent Labs    03/23/20 0232 03/24/20 0237  NA 141 141  K 4.2 3.7  CL 106 106  CO2 21* 22  GLUCOSE 144* 134*  BUN 13 13  CREATININE 1.42* 1.36*  CALCIUM 10.1 9.4   PT/INR No results for input(s): LABPROT, INR in the last 72 hours. CMP     Component Value Date/Time   NA 141 03/24/2020 0237   K 3.7 03/24/2020 0237   CL 106 03/24/2020 0237   CO2 22 03/24/2020 0237   GLUCOSE 134 (H) 03/24/2020 0237   BUN 13 03/24/2020 0237   CREATININE 1.36 (H) 03/24/2020 0237   CALCIUM 9.4 03/24/2020 0237   PROT 7.1 03/24/2020 0237   ALBUMIN 4.4 03/24/2020 0237   AST 31 03/24/2020 0237   ALT 23 03/24/2020 0237   ALKPHOS 46 03/24/2020 0237   BILITOT 1.5 (H) 03/24/2020 0237   GFRNONAA 60 (L) 03/24/2020 0237   GFRAA >60 03/24/2020 0237   Lipase     Component Value Date/Time   LIPASE 23 03/23/2020 0232       Studies/Results: CT ABDOMEN PELVIS W CONTRAST  Result Date: 03/23/2020 CLINICAL DATA:  Right upper quadrant pain, nausea,  vomiting EXAM: CT ABDOMEN AND PELVIS WITH CONTRAST TECHNIQUE: Multidetector CT imaging of the abdomen and pelvis was performed using the standard protocol following bolus administration of intravenous contrast. CONTRAST:  19mL OMNIPAQUE IOHEXOL 300 MG/ML  SOLN COMPARISON:  None. FINDINGS: Lower chest: Lung bases are clear. No effusions. Heart is normal size. Hepatobiliary: Diffuse fatty infiltration of the liver. No focal hepatic abnormality. Gallbladder unremarkable. Pancreas: No focal abnormality or ductal dilatation. Spleen: No focal abnormality.  Normal size. Adrenals/Urinary Tract: No adrenal abnormality. No focal renal abnormality. No stones or hydronephrosis. Urinary bladder is unremarkable. Stomach/Bowel: Colonic diverticulosis. No active diverticulitis. Normal appendix. Mildly prominent fluid-filled mid abdominal small bowel loops. Exact transition is not visualized, but distal small bowel loops are decompressed. There is edema within the r small bowel mesentery involving both dilated and nondilated bowel loops. Vascular/Lymphatic: Aortic atherosclerosis. No enlarged abdominal or pelvic lymph nodes. Reproductive: No visible focal abnormality. Other: No free fluid or free air. Musculoskeletal: No acute bony abnormality. IMPRESSION: Diffuse fatty infiltration of the liver. Prominent dilated fluid-filled small bowel loops in the mid and lower abdomen. Distal small bowel loops are decompressed. Exact transition is not visualized, there is edema within the small bowel mesentery. Findings are concerning for small bowel obstruction. Exact cause  not visualize, but given the edema within the small bowel mesentery, cannot exclude vascular compromise, possibly from internal hernia. Electronically Signed   By: Charlett Nose M.D.   On: 03/23/2020 03:10   DG Abd Portable 1V-Small Bowel Obstruction Protocol-initial, 8 hr delay  Result Date: 03/23/2020 CLINICAL DATA:  Small bowel obstructions EXAM: PORTABLE ABDOMEN - 1  VIEW COMPARISON:  03/23/2020 FINDINGS: Nasogastric tube is present. There is a small amount of inspissated barium in the stomach. Contrast medium is present in the urinary bladder, residual from recent CT examination. Most of the bowel is gasless. There is a mildly dilated loop of jejunum measuring 3.4 cm in diameter in the left upper quadrant. Partially transitional L5 vertebra with pseudoarticulation of the broad left transverse process of L5 with the sacrum. IMPRESSION: 1. Mildly dilated loop of left upper quadrant jejunum at 3.4 cm. Most of bowel is gasless. 2. Small amount of inspissated oral contrast medium in the stomach. No orally administered contrast is recognized in the bowel. Nasogastric tube is present in the stomach. Electronically Signed   By: Gaylyn Rong M.D.   On: 03/23/2020 16:29   DG Abd Portable 1V-Small Bowel Protocol-Position Verification  Result Date: 03/23/2020 CLINICAL DATA:  Nasogastric tube placement EXAM: PORTABLE ABDOMEN - 1 VIEW COMPARISON:  Earlier today FINDINGS: Nasogastric tube with tip and side-port over the stomach. Contrast is being excreted from the kidneys. No interval finding from prior CT. IMPRESSION: Nasogastric tube in good position. Electronically Signed   By: Marnee Spring M.D.   On: 03/23/2020 05:15    Anti-infectives: Anti-infectives (From admission, onward)   None       Assessment/Plan HTN urgency Hyperbilirubinemia CHF --Per primary--  Nausea, vomiting, possible SBO -films last night with contrast in stomach -now passing gas today.  Will recheck films.  NGT output is mostly clear -if films improved, may be able to remove tube and try clears, but will await film. -mobilize -no prior surgery  FEN - NPO/NGT/IVFs VTE - ok for chemical ppx from our standpoint ID - none currently needed   LOS: 1 day    Letha Cape , [facility] 03/24/2020, 9:34 AM Please see Amion for pager number during day hours  7:00am-4:30pm or 7:00am -11:30am on weekends

## 2020-03-24 NOTE — Progress Notes (Signed)
PROGRESS NOTE    Nathaniel Carr.  EZM:[id number] DOB: [birth year] DOA: 03/23/2020 PCP: Patient, [name]   Brief Narrative:  Nathaniel Carr. is a 54 y.o. male with medical history significant of hypertension, hyperlipidemia, anemia, chronic systolic CHF secondary to nonischemic cardiomyopathy presenting to the ED via EMS with complaints of sudden onset right upper quadrant abdominal pain associated with nausea and vomiting.  Hypertensive with systolic in the 200s with EMS.  Patient states he was doing fine during the day and symptoms started all of a sudden at night.  Reports having regular bowel movements.  Denies history of abdominal surgeries.  Denies fevers, chills, cough, shortness of breath, or chest pain. In ED: Patient was found to be afebrile, non-tachycardic.  Blood pressure 181/100 on arrival.  Labs showing no leukocytosis.  Lipase normal.  T bili 1.7, remainder of LFTs normal.  Creatinine 1.4, no significant change from baseline.  Lactic acid 2.5.  UA not suggestive of infection.  SARS-CoV-2 PCR test negative. CT showing dilated small bowel loops with no clear transition point. NG tube placed and general surgery consulted. Patient was given Reglan, morphine, Compazine, and 1 L normal saline bolus.  Hospitalist was called to admit.  Assessment & Plan:   Principal Problem:   SBO (small bowel obstruction) (HCC) Active Problems:   Hypertensive urgency   Bilirubinemia   CHF (congestive heart failure) (HCC)   Possible SBO, POA, minimally improving Intractable nausea, vomiting, pain, POA  - CT showing dilated small bowel loops with no clear transition point.  No prior history of abdominal surgeries.  No signs of peritonitis.  Mild lactic acidosis likely related to abdominal pathology.   - NG tube placed in the ED 700 cc output -trending output, currently paused, surgery debating on possibly removing NG if tolerating liquids this afternoon  -General surgery following,  appreciate insight and recommendations  Hypertensive urgency resolving:  -In the setting of above systolic intractable pain as high as 180 at admission -Blood pressure downtrending appropriately with pain control and improvement in abdominal pain nausea and vomiting  Hyperbilirubinemia:  - T bili 1.7, remainder of LFTs normal.  T bili was 2.0 in May 2020, possibly a chronic issue - Majority of bilirubin is indirect - Downtrending somewhat over the past 48 hours  Chronic systolic CHF:  - LVEF 40 to 45% on echo done November 2020.  Appears euvolemic at this time.  DVT prophylaxis: SCDs Code Status: Full code Family Communication: No family available this time. Disposition Plan:  Status is: Inpatient Dispo: The patient is from: Home              Anticipated d/c is to: Home              Anticipated d/c date is: 48 to 72 hours pending clinical course              Patient currently not medically stable for discharge due to ongoing need for work-up, possible surgical intervention and evaluation.  Consultants:   General surgery, cardiology  Procedures:   Pending  Antimicrobials:  Not currently indicated  Subjective: No acute issues or events overnight, patient evaluated this morning with NG tube clamped, patient's abdomen is much less tender, denies any further nausea vomiting, he does report flatus but denies any bowel movements.  Objective: Vitals:   03/23/20 0547 03/23/20 1610 03/23/20 1954 03/24/20 0541  BP: (!) 148/99 (!) 171/102 (!) 172/113 (!) 163/117  Pulse: 77 70 68 78  Resp: 16 14 17 18   Temp: 98.8 F (37.1 C) 99.5 F (37.5 C) 98.9 F (37.2 C) 98.9 F (37.2 C)  TempSrc: Oral Oral Oral   SpO2: 97% 100% 99% 98%  Weight:      Height:        Intake/Output Summary (Last 24 hours) at 03/24/2020 0851 Last data filed at 03/24/2020 0543 Gross per 24 hour  Intake 786.41 ml  Output 1200 ml  Net -413.59 ml   Filed Weights   03/23/20 0231  Weight: 85.3 kg     Examination:  General:  Pleasantly resting in bed, No acute distress. HEENT:  Normocephalic atraumatic.  Sclerae nonicteric, noninjected.  Extraocular movements intact bilaterally. Neck:  Without mass or deformity.  Trachea is midline. Lungs:  Clear to auscultate bilaterally without rhonchi, wheeze, or rales. Heart:  Regular rate and rhythm.  Without murmurs, rubs, or gallops. Abdomen:  Soft, minimally tender, nondistended.  Without guarding or rebound. Extremities: Without cyanosis, clubbing, edema, or obvious deformity. Vascular:  Dorsalis pedis and posterior tibial pulses palpable bilaterally. Skin:  Warm and dry, no erythema, no ulcerations.   Data Reviewed: I have personally reviewed following labs and imaging studies  CBC: Recent Labs  Lab 03/23/20 0232 03/24/20 0237  WBC 7.2 11.0*  NEUTROABS 6.2  --   HGB 15.1 15.4  HCT 45.1 45.2  MCV 96.8 95.4  PLT 246 223   Basic Metabolic Panel: Recent Labs  Lab 03/23/20 0232 03/24/20 0237  NA 141 141  K 4.2 3.7  CL 106 106  CO2 21* 22  GLUCOSE 144* 134*  BUN 13 13  CREATININE 1.42* 1.36*  CALCIUM 10.1 9.4   GFR: Estimated Creatinine Clearance: 68.3 mL/min (A) (by C-G formula based on SCr of 1.36 mg/dL (H)). Liver Function Tests: Recent Labs  Lab 03/23/20 0232 03/23/20 0840 03/24/20 0237  AST 34  --  31  ALT 27  --  23  ALKPHOS 44  --  46  BILITOT 1.7* 1.4* 1.5*  PROT 7.7  --  7.1  ALBUMIN 4.8  --  4.4   Recent Labs  Lab 03/23/20 0232  LIPASE 23   No results for input(s): AMMONIA in the last 168 hours. Coagulation Profile: No results for input(s): INR, PROTIME in the last 168 hours. Cardiac Enzymes: No results for input(s): CKTOTAL, CKMB, CKMBINDEX, TROPONINI in the last 168 hours. BNP (last 3 results) No results for input(s): PROBNP in the last 8760 hours. HbA1C: No results for input(s): HGBA1C in the last 72 hours. CBG: No results for input(s): GLUCAP in the last 168 hours. Lipid Profile: No  results for input(s): CHOL, HDL, LDLCALC, TRIG, CHOLHDL, LDLDIRECT in the last 72 hours. Thyroid Function Tests: No results for input(s): TSH, T4TOTAL, FREET4, T3FREE, THYROIDAB in the last 72 hours. Anemia Panel: No results for input(s): VITAMINB12, FOLATE, FERRITIN, TIBC, IRON, RETICCTPCT in the last 72 hours. Sepsis Labs: Recent Labs  Lab 03/23/20 0301 03/23/20 0510  LATICACIDVEN 2.5* 1.9    Recent Results (from the past 240 hour(s))  SARS Coronavirus 2 by RT PCR (hospital order, performed in [facility] hospital lab) Nasopharyngeal Nasopharyngeal Swab     Status: None   Collection Time: 03/23/20  3:43 AM   Specimen: Nasopharyngeal Swab  Result Value Ref Range Status   SARS Coronavirus 2 NEGATIVE NEGATIVE Final    Comment: (NOTE) SARS-CoV-2 target nucleic acids are NOT DETECTED.  The SARS-CoV-2 RNA is generally detectable in upper and lower respiratory specimens during the acute phase  of infection. The lowest concentration of SARS-CoV-2 viral copies this assay can detect is 250 copies / mL. A negative result does not preclude SARS-CoV-2 infection and should not be used as the sole basis for treatment or other patient management decisions.  A negative result may occur with improper specimen collection / handling, submission of specimen other than nasopharyngeal swab, presence of viral mutation(s) within the areas targeted by this assay, and inadequate number of viral copies (<250 copies / mL). A negative result must be combined with clinical observations, patient history, and epidemiological information.  Fact Sheet for Patients:   StrictlyIdeas.no  Fact Sheet for Healthcare Providers: BankingDealers.co.za  This test is not yet approved or  cleared by the [place] FDA and has been authorized for detection and/or diagnosis of SARS-CoV-2 by FDA under an Emergency Use Authorization (EUA).  This EUA will remain in effect  (meaning this test can be used) for the duration of the COVID-19 declaration under Section 564(b)(1) of the Act, 21 U.S.C. section 360bbb-3(b)(1), unless the authorization is terminated or revoked sooner.  Performed at [facility], [place] [address] [place], [place] [address]          Radiology Studies: CT ABDOMEN PELVIS W CONTRAST  Result Date: 03/23/2020 CLINICAL DATA:  Right upper quadrant pain, nausea, vomiting EXAM: CT ABDOMEN AND PELVIS WITH CONTRAST TECHNIQUE: Multidetector CT imaging of the abdomen and pelvis was performed using the standard protocol following bolus administration of intravenous contrast. CONTRAST:  150mL OMNIPAQUE IOHEXOL 300 MG/ML  SOLN COMPARISON:  None. FINDINGS: Lower chest: Lung bases are clear. No effusions. Heart is normal size. Hepatobiliary: Diffuse fatty infiltration of the liver. No focal hepatic abnormality. Gallbladder unremarkable. Pancreas: No focal abnormality or ductal dilatation. Spleen: No focal abnormality.  Normal size. Adrenals/Urinary Tract: No adrenal abnormality. No focal renal abnormality. No stones or hydronephrosis. Urinary bladder is unremarkable. Stomach/Bowel: Colonic diverticulosis. No active diverticulitis. Normal appendix. Mildly prominent fluid-filled mid abdominal small bowel loops. Exact transition is not visualized, but distal small bowel loops are decompressed. There is edema within the r small bowel mesentery involving both dilated and nondilated bowel loops. Vascular/Lymphatic: Aortic atherosclerosis. No enlarged abdominal or pelvic lymph nodes. Reproductive: No visible focal abnormality. Other: No free fluid or free air. Musculoskeletal: No acute bony abnormality. IMPRESSION: Diffuse fatty infiltration of the liver. Prominent dilated fluid-filled small bowel loops in the mid and lower abdomen. Distal small bowel loops are decompressed. Exact transition is not visualized, there is edema within the small bowel mesentery. Findings  are concerning for small bowel obstruction. Exact cause not visualize, but given the edema within the small bowel mesentery, cannot exclude vascular compromise, possibly from internal hernia. Electronically Signed   By: Rolm Baptise M.D.   On: 03/23/2020 03:10   DG Abd Portable 1V-Small Bowel Obstruction Protocol-initial, 8 hr delay  Result Date: 03/23/2020 CLINICAL DATA:  Small bowel obstructions EXAM: PORTABLE ABDOMEN - 1 VIEW COMPARISON:  03/23/2020 FINDINGS: Nasogastric tube is present. There is a small amount of inspissated barium in the stomach. Contrast medium is present in the urinary bladder, residual from recent CT examination. Most of the bowel is gasless. There is a mildly dilated loop of jejunum measuring 3.4 cm in diameter in the left upper quadrant. Partially transitional L5 vertebra with pseudoarticulation of the broad left transverse process of L5 with the sacrum. IMPRESSION: 1. Mildly dilated loop of left upper quadrant jejunum at 3.4 cm. Most of bowel is gasless. 2. Small amount of inspissated oral  contrast medium in the stomach. No orally administered contrast is recognized in the bowel. Nasogastric tube is present in the stomach. Electronically Signed   By: Gaylyn Rong M.D.   On: 03/23/2020 16:29   DG Abd Portable 1V-Small Bowel Protocol-Position Verification  Result Date: 03/23/2020 CLINICAL DATA:  Nasogastric tube placement EXAM: PORTABLE ABDOMEN - 1 VIEW COMPARISON:  Earlier today FINDINGS: Nasogastric tube with tip and side-port over the stomach. Contrast is being excreted from the kidneys. No interval finding from prior CT. IMPRESSION: Nasogastric tube in good position. Electronically Signed   By: Marnee Spring M.D.   On: 03/23/2020 05:15    Scheduled Meds: . atorvastatin  40 mg Oral Daily  . carvedilol  25 mg Oral BID WC  . dapagliflozin propanediol  10 mg Oral QAC breakfast  . hydrALAZINE  75 mg Oral TID  . sacubitril-valsartan  1 tablet Oral BID  .  spironolactone  25 mg Oral Daily   Continuous Infusions:    LOS: 1 day   Time spent:  Azucena Fallen, DO [facility]  If 7PM-7AM, please contact night-coverage [email or web address]  03/24/2020, 8:51 AM

## 2020-03-24 NOTE — Plan of Care (Signed)
  Problem: Education: Goal: Knowledge of General Education information will improve Description Including pain rating scale, medication(s)/side effects and non-pharmacologic comfort measures Outcome: Progressing   

## 2020-03-25 DIAGNOSIS — I5022 Chronic systolic (congestive) heart failure: Secondary | ICD-10-CM

## 2020-03-25 LAB — COMPREHENSIVE METABOLIC PANEL
ALT: 24 U/L (ref 0–44)
AST: 25 U/L (ref 15–41)
Albumin: 4.2 g/dL (ref 3.5–5.0)
Alkaline Phosphatase: 43 U/L (ref 38–126)
Anion gap: 14 (ref 5–15)
BUN: 17 mg/dL (ref 6–20)
CO2: 24 mmol/L (ref 22–32)
Calcium: 9.8 mg/dL (ref 8.9–10.3)
Chloride: 103 mmol/L (ref 98–111)
Creatinine, Ser: 1.36 mg/dL — ABNORMAL HIGH (ref 0.61–1.24)
GFR calc Af Amer: >60 mL/min (ref >60)
GFR calc non Af Amer: 60 mL/min — ABNORMAL LOW (ref >60)
Glucose, Bld: 123 mg/dL — ABNORMAL HIGH (ref 70–99)
Potassium: 3.4 mmol/L — ABNORMAL LOW (ref 3.5–5.1)
Sodium: 141 mmol/L (ref 135–145)
Total Bilirubin: 1.8 mg/dL — ABNORMAL HIGH (ref 0.3–1.2)
Total Protein: 7.5 g/dL (ref 6.5–8.1)

## 2020-03-25 LAB — CBC
HCT: 48.8 % (ref 39.0–52.0)
Hemoglobin: 16.2 g/dL (ref 13.0–17.0)
MCH: 32 pg (ref 26.0–34.0)
MCHC: 33.2 g/dL (ref 30.0–36.0)
MCV: 96.3 fL (ref 80.0–100.0)
Platelets: 226 10*3/uL (ref 150–400)
RBC: 5.07 MIL/uL (ref 4.22–5.81)
RDW: 13.2 % (ref 11.5–15.5)
WBC: 12.3 10*3/uL — ABNORMAL HIGH (ref 4.0–10.5)
nRBC: 0 % (ref 0.0–0.2)

## 2020-03-25 MED ORDER — CARVEDILOL 25 MG PO TABS
25.0000 mg | ORAL_TABLET | Freq: Two times a day (BID) | ORAL | Status: DC
  Administered 2020-03-25: 25 mg via ORAL
  Filled 2020-03-25 (×2): qty 1

## 2020-03-25 MED ORDER — ISOSORBIDE MONONITRATE ER 60 MG PO TB24
90.0000 mg | ORAL_TABLET | Freq: Every day | ORAL | Status: DC
  Administered 2020-03-25: 90 mg via ORAL
  Filled 2020-03-25 (×2): qty 1

## 2020-03-25 MED ORDER — HYDRALAZINE HCL 50 MG PO TABS
75.0000 mg | ORAL_TABLET | Freq: Three times a day (TID) | ORAL | Status: DC
  Administered 2020-03-25: 75 mg via ORAL
  Filled 2020-03-25 (×2): qty 1

## 2020-03-25 MED ORDER — SPIRONOLACTONE 25 MG PO TABS
25.0000 mg | ORAL_TABLET | Freq: Every day | ORAL | Status: DC
  Filled 2020-03-25: qty 1

## 2020-03-25 MED ORDER — ATORVASTATIN CALCIUM 40 MG PO TABS
40.0000 mg | ORAL_TABLET | Freq: Every day | ORAL | Status: DC
  Filled 2020-03-25: qty 1

## 2020-03-25 MED ORDER — SODIUM CHLORIDE 0.9 % IV BOLUS
500.0000 mL | Freq: Once | INTRAVENOUS | Status: AC
  Administered 2020-03-25: 500 mL via INTRAVENOUS

## 2020-03-25 NOTE — Progress Notes (Signed)
Pt's BP at 83/49, asymptomatic, notified MD on call. Will monitor pt.

## 2020-03-25 NOTE — Progress Notes (Signed)
Subjective/Chief Complaint: Doing better, passing flatus, abdomen much better, tol full liquids   Objective: Vital signs in last 24 hours: Temp:  [98 F (36.7 C)-98.4 F (36.9 C)] 98.4 F (36.9 C) (06/18 0424) Pulse Rate:  [75-79] 78 (06/18 0424) Resp:  [16-17] 16 (06/18 0424) BP: (140-172)/(100-118) 140/100 (06/18 0424) SpO2:  [93 %-98 %] 93 % (06/18 0424)    Intake/Output from previous day: 06/17 0701 - 06/18 0700 In: 360 [P.O.:360] Out: -  Intake/Output this shift: No intake/output data recorded.  GI: soft nt/nd bs present  Lab Results:  Recent Labs    03/24/20 0237 03/25/20 0207  WBC 11.0* 12.3*  HGB 15.4 16.2  HCT 45.2 48.8  PLT 223 226   BMET Recent Labs    03/24/20 0237 03/25/20 0207  NA 141 141  K 3.7 3.4*  CL 106 103  CO2 22 24  GLUCOSE 134* 123*  BUN 13 17  CREATININE 1.36* 1.36*  CALCIUM 9.4 9.8   PT/INR No results for input(s): LABPROT, INR in the last 72 hours. ABG No results for input(s): PHART, HCO3 in the last 72 hours.  Invalid input(s): PCO2, PO2  Studies/Results: DG Abd Portable 1V  Result Date: 03/24/2020 CLINICAL DATA:  Small bowel obstruction EXAM: PORTABLE ABDOMEN - 1 VIEW COMPARISON:  Yesterday FINDINGS: No visible contrast within colon which remains similar caliber. There is still dilated small bowel loops in the central abdomen. Enteric tube in good position. No worrisome mass effect or gas collection IMPRESSION: Continued small bowel obstruction. Oral contrast still localizes to small bowel. Electronically Signed   By: Monte Fantasia M.D.   On: 03/24/2020 10:36   DG Abd Portable 1V-Small Bowel Obstruction Protocol-initial, 8 hr delay  Result Date: 03/23/2020 CLINICAL DATA:  Small bowel obstructions EXAM: PORTABLE ABDOMEN - 1 VIEW COMPARISON:  03/23/2020 FINDINGS: Nasogastric tube is present. There is a small amount of inspissated barium in the stomach. Contrast medium is present in the urinary bladder, residual from  recent CT examination. Most of the bowel is gasless. There is a mildly dilated loop of jejunum measuring 3.4 cm in diameter in the left upper quadrant. Partially transitional L5 vertebra with pseudoarticulation of the broad left transverse process of L5 with the sacrum. IMPRESSION: 1. Mildly dilated loop of left upper quadrant jejunum at 3.4 cm. Most of bowel is gasless. 2. Small amount of inspissated oral contrast medium in the stomach. No orally administered contrast is recognized in the bowel. Nasogastric tube is present in the stomach. Electronically Signed   By: Van Clines M.D.   On: 03/23/2020 16:29   ECHOCARDIOGRAM COMPLETE  Result Date: 03/24/2020    ECHOCARDIOGRAM REPORT   Patient Name:   Nathaniel Carr. Date of Exam: 03/24/2020 Medical Rec #:  [id number]             Height:       68.0 in Accession #:    [id number]            Weight:       188.0 lb Date of Birth:  [birth year]            BSA:          1.991 m Patient Age:    54 years              BP:           163/117 mmHg Patient Gender: M  HR:           66 bpm. Exam Location:  Inpatient Procedure: 2D Echo, 3D Echo, Color Doppler and Cardiac Doppler Indications:    I42.9 Cardiomyopathy (unspecified), Mitral Insufficiency  History:        Patient has prior history of Echocardiogram examinations, most                 recent 08/17/2019. CHF; Risk Factors:Dyslipidemia and                 Hypertension.  Sonographer:    Irving Burton Senior RDCS Referring Phys: [id number] Parke Poisson IMPRESSIONS  1. Left ventricular ejection fraction, by estimation, is 45%. The left ventricle has mildly decreased function. The left ventricle demonstrates global hypokinesis. The left ventricular internal cavity size was mildly dilated. Left ventricular diastolic parameters are consistent with Grade I diastolic dysfunction (impaired relaxation).  2. Right ventricular systolic function is normal. The right ventricular size is normal. There is normal  pulmonary artery systolic pressure.  3. Left atrial size was mild to moderately dilated.  4. The mitral valve is normal in structure. Mild to moderate mitral valve regurgitation.  5. The aortic valve is normal in structure. Aortic valve regurgitation is trivial.  6. The inferior vena cava is normal in size with greater than 50% respiratory variability, suggesting right atrial pressure of 3 mmHg. FINDINGS  Left Ventricle: Left ventricular ejection fraction, by estimation, is 45%. The left ventricle has mildly decreased function. The left ventricle demonstrates global hypokinesis. The left ventricular internal cavity size was mildly dilated. There is no left ventricular hypertrophy. Left ventricular diastolic parameters are consistent with Grade I diastolic dysfunction (impaired relaxation). Right Ventricle: The right ventricular size is normal. No increase in right ventricular wall thickness. Right ventricular systolic function is normal. There is normal pulmonary artery systolic pressure. The tricuspid regurgitant velocity is 2.57 m/s, and  with an assumed right atrial pressure of 3 mmHg, the estimated right ventricular systolic pressure is 29.4 mmHg. Left Atrium: Left atrial size was mild to moderately dilated. Right Atrium: Right atrial size was normal in size. Pericardium: There is no evidence of pericardial effusion. Mitral Valve: The mitral valve is normal in structure. Mild to moderate mitral valve regurgitation. Tricuspid Valve: The tricuspid valve is normal in structure. Tricuspid valve regurgitation is mild. Aortic Valve: The aortic valve is normal in structure. Aortic valve regurgitation is trivial. There is mild calcification of the aortic valve. Pulmonic Valve: The pulmonic valve was normal in structure. Pulmonic valve regurgitation is trivial. Aorta: The aortic root and ascending aorta are structurally normal, with no evidence of dilitation. Venous: The inferior vena cava is normal in size with greater  than 50% respiratory variability, suggesting right atrial pressure of 3 mmHg. IAS/Shunts: No atrial level shunt detected by color flow Doppler.  LEFT VENTRICLE PLAX 2D LVIDd:         6.00 cm  Diastology LVIDs:         4.60 cm  LV e' lateral:   7.29 cm/s LV PW:         1.00 cm  LV E/e' lateral: 9.5 LV IVS:        0.90 cm  LV e' medial:    3.37 cm/s LVOT diam:     2.10 cm  LV E/e' medial:  20.6 LV SV:         58 LV SV Index:   29 LVOT Area:     3.46 cm  3D Volume EF:                         3D EF:        46 %                         LV EDV:       196 ml                         LV ESV:       105 ml                         LV SV:        91 ml RIGHT VENTRICLE RV S prime:     9.46 cm/s TAPSE (M-mode): 2.3 cm LEFT ATRIUM             Index       RIGHT ATRIUM           Index LA diam:        3.50 cm 1.76 cm/m  RA Area:     19.00 cm LA Vol (A2C):   62.5 ml 31.40 ml/m RA Volume:   44.50 ml  22.35 ml/m LA Vol (A4C):   62.0 ml 31.14 ml/m LA Biplane Vol: 64.2 ml 32.25 ml/m  AORTIC VALVE LVOT Vmax:   71.10 cm/s LVOT Vmean:  52.200 cm/s LVOT VTI:    0.167 m  AORTA Ao Root diam: 3.50 cm Ao Asc diam:  3.80 cm MITRAL VALVE               TRICUSPID VALVE MV Area (PHT): 2.73 cm    TR Peak grad:   26.4 mmHg MV Decel Time: 278 msec    TR Vmax:        257.00 cm/s MV E velocity: 69.40 cm/s MV A velocity: 90.00 cm/s  SHUNTS MV E/A ratio:  0.77        Systemic VTI:  0.17 m                            Systemic Diam: 2.10 cm Arvilla Meres MD Electronically signed by Arvilla Meres MD Signature Date/Time: 03/24/2020/5:44:26 PM    Final     Anti-infectives: Anti-infectives (From admission, onward)   None      Assessment/Plan: HTN urgency Hyperbilirubinemia CHF --Per primary--  Nausea, vomiting, possible SBO -clinically much better today, I think reasonable to advance to soft diet and see how he does, if he is not able to tolerate and regresses will have to consider dx lsc -no prior surgery -dc  ng and soft diet written for  FEN - soft diet VTE - ok for chemical ppx from our standpoint ID - none currently needed  Emelia Loron 03/25/2020

## 2020-03-25 NOTE — Progress Notes (Signed)
Progress Note  Patient Name: Nathaniel Carr. Date of Encounter: 03/25/2020  [facility] Cardiologist: No primary care provider on file. [place]  Subjective   Feeling better, no CP Or SOB.  Inpatient Medications    Scheduled Meds: . atorvastatin  40 mg Per Tube Daily  . carvedilol  25 mg Per Tube BID WC  . dapagliflozin propanediol  10 mg Oral QAC breakfast  . hydrALAZINE  75 mg Per Tube TID  . sacubitril-valsartan  1 tablet Oral BID  . spironolactone  25 mg Per Tube Daily   Continuous Infusions:  PRN Meds: acetaminophen **OR** acetaminophen, hydrALAZINE, morphine injection, ondansetron (ZOFRAN) IV   Vital Signs    Vitals:   03/24/20 0541 03/24/20 1514 03/24/20 2041 03/25/20 0424  BP: (!) 163/117 (!) 172/118 (!) 153/112 (!) 140/100  Pulse: 78 75 79 78  Resp: 18 17 17 16   Temp: 98.9 F (37.2 C) 98 F (36.7 C) 98.4 F (36.9 C) 98.4 F (36.9 C)  TempSrc:  Oral    SpO2: 98% 98% 96% 93%  Weight:      Height:        Intake/Output Summary (Last 24 hours) at 03/25/2020 0538 Last data filed at 03/24/2020 1527 Gross per 24 hour  Intake 240 ml  Output 100 ml  Net 140 ml   Last 3 Weights 03/23/2020 02/01/2020 12/07/2019  Weight (lbs) 188 lb 196 lb 198 lb  Weight (kg) 85.276 kg 88.905 kg 89.812 kg      Telemetry    SR - Personally Reviewed  ECG    No new - Personally Reviewed  Physical Exam   GEN: No acute distress.   Neck: No JVD Cardiac: RRR, 2/6 HSM  Respiratory: Clear to auscultation bilaterally. GI: Soft, nontender, non-distended  MS: No edema; No deformity. Neuro:  Nonfocal  Psych: Normal affect   Labs    High Sensitivity Troponin:  No results for input(s): TROPONINIHS in the last 720 hours.    Chemistry Recent Labs  Lab 03/23/20 0232 03/23/20 0232 03/23/20 0840 03/24/20 0237 03/25/20 0207  NA 141  --   --  141 141  K 4.2  --   --  3.7 3.4*  CL 106  --   --  106 103  CO2 21*  --   --  22 24  GLUCOSE 144*  --   --  134* 123*  BUN  13  --   --  13 17  CREATININE 1.42*  --   --  1.36* 1.36*  CALCIUM 10.1  --   --  9.4 9.8  PROT 7.7  --   --  7.1 7.5  ALBUMIN 4.8  --   --  4.4 4.2  AST 34  --   --  31 25  ALT 27  --   --  23 24  ALKPHOS 44  --   --  46 43  BILITOT 1.7*   < > 1.4* 1.5* 1.8*  GFRNONAA 57*  --   --  60* 60*  GFRAA >60  --   --  >60 >60  ANIONGAP 14  --   --  13 14   < > = values in this interval not displayed.     Hematology Recent Labs  Lab 03/23/20 0232 03/24/20 0237 03/25/20 0207  WBC 7.2 11.0* 12.3*  RBC 4.66 4.74 5.07  HGB 15.1 15.4 16.2  HCT 45.1 45.2 48.8  MCV 96.8 95.4 96.3  MCH 32.4 32.5 32.0  MCHC 33.5 34.1 33.2  RDW 12.9 13.0 13.2  PLT 246 223 226    BNPNo results for input(s): BNP, PROBNP in the last 168 hours.   DDimer No results for input(s): DDIMER in the last 168 hours.   Radiology    DG Abd Portable 1V  Result Date: 03/24/2020 CLINICAL DATA:  Small bowel obstruction EXAM: PORTABLE ABDOMEN - 1 VIEW COMPARISON:  Yesterday FINDINGS: No visible contrast within colon which remains similar caliber. There is still dilated small bowel loops in the central abdomen. Enteric tube in good position. No worrisome mass effect or gas collection IMPRESSION: Continued small bowel obstruction. Oral contrast still localizes to small bowel. Electronically Signed   By: Marnee Spring M.D.   On: 03/24/2020 10:36   DG Abd Portable 1V-Small Bowel Obstruction Protocol-initial, 8 hr delay  Result Date: 03/23/2020 CLINICAL DATA:  Small bowel obstructions EXAM: PORTABLE ABDOMEN - 1 VIEW COMPARISON:  03/23/2020 FINDINGS: Nasogastric tube is present. There is a small amount of inspissated barium in the stomach. Contrast medium is present in the urinary bladder, residual from recent CT examination. Most of the bowel is gasless. There is a mildly dilated loop of jejunum measuring 3.4 cm in diameter in the left upper quadrant. Partially transitional L5 vertebra with pseudoarticulation of the broad left  transverse process of L5 with the sacrum. IMPRESSION: 1. Mildly dilated loop of left upper quadrant jejunum at 3.4 cm. Most of bowel is gasless. 2. Small amount of inspissated oral contrast medium in the stomach. No orally administered contrast is recognized in the bowel. Nasogastric tube is present in the stomach. Electronically Signed   By: Gaylyn Rong M.D.   On: 03/23/2020 16:29   ECHOCARDIOGRAM COMPLETE  Result Date: 03/24/2020    ECHOCARDIOGRAM REPORT   Patient Name:   Nathaniel Carr. Date of Exam: 03/24/2020 Medical Rec #:  [id number]             Height:       68.0 in Accession #:    [id number]            Weight:       188.0 lb Date of Birth:  12/19/1965            BSA:          1.991 m Patient Age:    53 years              BP:           163/117 mmHg Patient Gender: M                     HR:           66 bpm. Exam Location:  Inpatient Procedure: 2D Echo, 3D Echo, Color Doppler and Cardiac Doppler Indications:    I42.9 Cardiomyopathy (unspecified), Mitral Insufficiency  History:        Patient has prior history of Echocardiogram examinations, most                 recent 08/17/2019. CHF; Risk Factors:Dyslipidemia and                 Hypertension.  Sonographer:    Irving Burton Senior RDCS Referring Phys: [id number] Parke Poisson IMPRESSIONS  1. Left ventricular ejection fraction, by estimation, is 45%. The left ventricle has mildly decreased function. The left ventricle demonstrates global hypokinesis. The left ventricular internal cavity size was mildly dilated. Left ventricular diastolic parameters are consistent with Grade  I diastolic dysfunction (impaired relaxation).  2. Right ventricular systolic function is normal. The right ventricular size is normal. There is normal pulmonary artery systolic pressure.  3. Left atrial size was mild to moderately dilated.  4. The mitral valve is normal in structure. Mild to moderate mitral valve regurgitation.  5. The aortic valve is normal in structure. Aortic  valve regurgitation is trivial.  6. The inferior vena cava is normal in size with greater than 50% respiratory variability, suggesting right atrial pressure of 3 mmHg. FINDINGS  Left Ventricle: Left ventricular ejection fraction, by estimation, is 45%. The left ventricle has mildly decreased function. The left ventricle demonstrates global hypokinesis. The left ventricular internal cavity size was mildly dilated. There is no left ventricular hypertrophy. Left ventricular diastolic parameters are consistent with Grade I diastolic dysfunction (impaired relaxation). Right Ventricle: The right ventricular size is normal. No increase in right ventricular wall thickness. Right ventricular systolic function is normal. There is normal pulmonary artery systolic pressure. The tricuspid regurgitant velocity is 2.57 m/s, and  with an assumed right atrial pressure of 3 mmHg, the estimated right ventricular systolic pressure is 29.4 mmHg. Left Atrium: Left atrial size was mild to moderately dilated. Right Atrium: Right atrial size was normal in size. Pericardium: There is no evidence of pericardial effusion. Mitral Valve: The mitral valve is normal in structure. Mild to moderate mitral valve regurgitation. Tricuspid Valve: The tricuspid valve is normal in structure. Tricuspid valve regurgitation is mild. Aortic Valve: The aortic valve is normal in structure. Aortic valve regurgitation is trivial. There is mild calcification of the aortic valve. Pulmonic Valve: The pulmonic valve was normal in structure. Pulmonic valve regurgitation is trivial. Aorta: The aortic root and ascending aorta are structurally normal, with no evidence of dilitation. Venous: The inferior vena cava is normal in size with greater than 50% respiratory variability, suggesting right atrial pressure of 3 mmHg. IAS/Shunts: No atrial level shunt detected by color flow Doppler.  LEFT VENTRICLE PLAX 2D LVIDd:         6.00 cm  Diastology LVIDs:         4.60 cm  LV  e' lateral:   7.29 cm/s LV PW:         1.00 cm  LV E/e' lateral: 9.5 LV IVS:        0.90 cm  LV e' medial:    3.37 cm/s LVOT diam:     2.10 cm  LV E/e' medial:  20.6 LV SV:         58 LV SV Index:   29 LVOT Area:     3.46 cm                          3D Volume EF:                         3D EF:        46 %                         LV EDV:       196 ml                         LV ESV:       105 ml  LV SV:        91 ml RIGHT VENTRICLE RV S prime:     9.46 cm/s TAPSE (M-mode): 2.3 cm LEFT ATRIUM             Index       RIGHT ATRIUM           Index LA diam:        3.50 cm 1.76 cm/m  RA Area:     19.00 cm LA Vol (A2C):   62.5 ml 31.40 ml/m RA Volume:   44.50 ml  22.35 ml/m LA Vol (A4C):   62.0 ml 31.14 ml/m LA Biplane Vol: 64.2 ml 32.25 ml/m  AORTIC VALVE LVOT Vmax:   71.10 cm/s LVOT Vmean:  52.200 cm/s LVOT VTI:    0.167 m  AORTA Ao Root diam: 3.50 cm Ao Asc diam:  3.80 cm MITRAL VALVE               TRICUSPID VALVE MV Area (PHT): 2.73 cm    TR Peak grad:   26.4 mmHg MV Decel Time: 278 msec    TR Vmax:        257.00 cm/s MV E velocity: 69.40 cm/s MV A velocity: 90.00 cm/s  SHUNTS MV E/A ratio:  0.77        Systemic VTI:  0.17 m                            Systemic Diam: 2.10 cm Glori Bickers MD Electronically signed by Glori Bickers MD Signature Date/Time: 03/24/2020/5:44:26 PM    Final     Cardiac Studies   Echo pending at time of exam.  Patient Profile     Nathaniel Carr. is a 54 y.o. male with a hx of HTN, HLD, anemia and no known cardiac hx. But NICM with EF 20-25% though improved with meds to 40-45% who is being seen today for the evaluation of pre-op eval for possible exploratory laparotomy if pt does not improve at the request of Dr. Georgette Dover and Dr. Avon Gully.  Assessment & Plan    1. Pre-op eval for possible exploratory Lap with SBO, NG in place. Likely intermediate risk for MACE due to underlying cardiomyopathy, however patient progressing well with conservative  management.   2. NICM with last EF improved to 40-45% on medications.  Resume medications that can be crushed today - statin, carvedilol, entresto, spironolactone, farxiga. 3. Chronic systolic HF currently euvolemic 4. HTN - resume HF therapy today 5. HLD - statin      For questions or updates, please contact [place] Please consult [email or web address] for contact info under        Signed, Elouise Munroe, MD

## 2020-03-25 NOTE — Progress Notes (Signed)
Mild bolus given, pt's BP went up to 104/63.

## 2020-03-25 NOTE — Progress Notes (Signed)
Progress Note  Patient Name: Nathaniel Carr. Date of Encounter: 03/25/2020  [facility] Cardiologist: No primary care provider on file. [place]  Subjective   Improving daily, NG is out, liquid diet. Passing flatus per Surgery.  Inpatient Medications    Scheduled Meds: . atorvastatin  40 mg Per Tube Daily  . carvedilol  25 mg Per Tube BID WC  . dapagliflozin propanediol  10 mg Oral QAC breakfast  . hydrALAZINE  75 mg Per Tube TID  . isosorbide mononitrate  90 mg Oral Daily  . sacubitril-valsartan  1 tablet Oral BID  . spironolactone  25 mg Per Tube Daily   Continuous Infusions:  PRN Meds: acetaminophen **OR** acetaminophen, hydrALAZINE, morphine injection, ondansetron (ZOFRAN) IV   Vital Signs    Vitals:   03/24/20 0541 03/24/20 1514 03/24/20 2041 03/25/20 0424  BP: (!) 163/117 (!) 172/118 (!) 153/112 (!) 140/100  Pulse: 78 75 79 78  Resp: 18 17 17 16   Temp: 98.9 F (37.2 C) 98 F (36.7 C) 98.4 F (36.9 C) 98.4 F (36.9 C)  TempSrc:  Oral    SpO2: 98% 98% 96% 93%  Weight:      Height:        Intake/Output Summary (Last 24 hours) at 03/25/2020 1003 Last data filed at 03/25/2020 0600 Gross per 24 hour  Intake 360 ml  Output --  Net 360 ml   Last 3 Weights 03/23/2020 02/01/2020 12/07/2019  Weight (lbs) 188 lb 196 lb 198 lb  Weight (kg) 85.276 kg 88.905 kg 89.812 kg      Telemetry    SR - Personally Reviewed  ECG    No new - Personally Reviewed  Physical Exam   GEN: No acute distress.   Neck: No JVD Cardiac: RRR, 1/6 HSM Respiratory: Clear to auscultation bilaterally. GI: Soft, nontender, non-distended  MS: No edema; No deformity. Neuro:  Nonfocal  Psych: Normal affect    Labs    High Sensitivity Troponin:  No results for input(s): TROPONINIHS in the last 720 hours.    Chemistry Recent Labs  Lab 03/23/20 0232 03/23/20 0232 03/23/20 0840 03/24/20 0237 03/25/20 0207  NA 141  --   --  141 141  K 4.2  --   --  3.7 3.4*  CL 106  --    --  106 103  CO2 21*  --   --  22 24  GLUCOSE 144*  --   --  134* 123*  BUN 13  --   --  13 17  CREATININE 1.42*  --   --  1.36* 1.36*  CALCIUM 10.1  --   --  9.4 9.8  PROT 7.7  --   --  7.1 7.5  ALBUMIN 4.8  --   --  4.4 4.2  AST 34  --   --  31 25  ALT 27  --   --  23 24  ALKPHOS 44  --   --  46 43  BILITOT 1.7*   < > 1.4* 1.5* 1.8*  GFRNONAA 57*  --   --  60* 60*  GFRAA >60  --   --  >60 >60  ANIONGAP 14  --   --  13 14   < > = values in this interval not displayed.     Hematology Recent Labs  Lab 03/23/20 0232 03/24/20 0237 03/25/20 0207  WBC 7.2 11.0* 12.3*  RBC 4.66 4.74 5.07  HGB 15.1 15.4 16.2  HCT 45.1  45.2 48.8  MCV 96.8 95.4 96.3  MCH 32.4 32.5 32.0  MCHC 33.5 34.1 33.2  RDW 12.9 13.0 13.2  PLT 246 223 226    BNPNo results for input(s): BNP, PROBNP in the last 168 hours.   DDimer No results for input(s): DDIMER in the last 168 hours.   Radiology    DG Abd Portable 1V  Result Date: 03/24/2020 CLINICAL DATA:  Small bowel obstruction EXAM: PORTABLE ABDOMEN - 1 VIEW COMPARISON:  Yesterday FINDINGS: No visible contrast within colon which remains similar caliber. There is still dilated small bowel loops in the central abdomen. Enteric tube in good position. No worrisome mass effect or gas collection IMPRESSION: Continued small bowel obstruction. Oral contrast still localizes to small bowel. Electronically Signed   By: Marnee Spring M.D.   On: 03/24/2020 10:36   DG Abd Portable 1V-Small Bowel Obstruction Protocol-initial, 8 hr delay  Result Date: 03/23/2020 CLINICAL DATA:  Small bowel obstructions EXAM: PORTABLE ABDOMEN - 1 VIEW COMPARISON:  03/23/2020 FINDINGS: Nasogastric tube is present. There is a small amount of inspissated barium in the stomach. Contrast medium is present in the urinary bladder, residual from recent CT examination. Most of the bowel is gasless. There is a mildly dilated loop of jejunum measuring 3.4 cm in diameter in the left upper  quadrant. Partially transitional L5 vertebra with pseudoarticulation of the broad left transverse process of L5 with the sacrum. IMPRESSION: 1. Mildly dilated loop of left upper quadrant jejunum at 3.4 cm. Most of bowel is gasless. 2. Small amount of inspissated oral contrast medium in the stomach. No orally administered contrast is recognized in the bowel. Nasogastric tube is present in the stomach. Electronically Signed   By: Gaylyn Rong M.D.   On: 03/23/2020 16:29   ECHOCARDIOGRAM COMPLETE  Result Date: 03/24/2020    ECHOCARDIOGRAM REPORT   Patient Name:   Nathaniel Carr. Date of Exam: 03/24/2020 Medical Rec #:  [id number]             Height:       68.0 in Accession #:    [id number]            Weight:       188.0 lb Date of Birth:  27-Nov-1965            BSA:          1.991 m Patient Age:    53 years              BP:           163/117 mmHg Patient Gender: M                     HR:           66 bpm. Exam Location:  Inpatient Procedure: 2D Echo, 3D Echo, Color Doppler and Cardiac Doppler Indications:    I42.9 Cardiomyopathy (unspecified), Mitral Insufficiency  History:        Patient has prior history of Echocardiogram examinations, most                 recent 08/17/2019. CHF; Risk Factors:Dyslipidemia and                 Hypertension.  Sonographer:    Irving Burton Senior RDCS Referring Phys: [id number] Parke Poisson IMPRESSIONS  1. Left ventricular ejection fraction, by estimation, is 45%. The left ventricle has mildly decreased function. The left ventricle demonstrates global hypokinesis. The left ventricular internal  cavity size was mildly dilated. Left ventricular diastolic parameters are consistent with Grade I diastolic dysfunction (impaired relaxation).  2. Right ventricular systolic function is normal. The right ventricular size is normal. There is normal pulmonary artery systolic pressure.  3. Left atrial size was mild to moderately dilated.  4. The mitral valve is normal in structure. Mild to  moderate mitral valve regurgitation.  5. The aortic valve is normal in structure. Aortic valve regurgitation is trivial.  6. The inferior vena cava is normal in size with greater than 50% respiratory variability, suggesting right atrial pressure of 3 mmHg. FINDINGS  Left Ventricle: Left ventricular ejection fraction, by estimation, is 45%. The left ventricle has mildly decreased function. The left ventricle demonstrates global hypokinesis. The left ventricular internal cavity size was mildly dilated. There is no left ventricular hypertrophy. Left ventricular diastolic parameters are consistent with Grade I diastolic dysfunction (impaired relaxation). Right Ventricle: The right ventricular size is normal. No increase in right ventricular wall thickness. Right ventricular systolic function is normal. There is normal pulmonary artery systolic pressure. The tricuspid regurgitant velocity is 2.57 m/s, and  with an assumed right atrial pressure of 3 mmHg, the estimated right ventricular systolic pressure is 29.4 mmHg. Left Atrium: Left atrial size was mild to moderately dilated. Right Atrium: Right atrial size was normal in size. Pericardium: There is no evidence of pericardial effusion. Mitral Valve: The mitral valve is normal in structure. Mild to moderate mitral valve regurgitation. Tricuspid Valve: The tricuspid valve is normal in structure. Tricuspid valve regurgitation is mild. Aortic Valve: The aortic valve is normal in structure. Aortic valve regurgitation is trivial. There is mild calcification of the aortic valve. Pulmonic Valve: The pulmonic valve was normal in structure. Pulmonic valve regurgitation is trivial. Aorta: The aortic root and ascending aorta are structurally normal, with no evidence of dilitation. Venous: The inferior vena cava is normal in size with greater than 50% respiratory variability, suggesting right atrial pressure of 3 mmHg. IAS/Shunts: No atrial level shunt detected by color flow  Doppler.  LEFT VENTRICLE PLAX 2D LVIDd:         6.00 cm  Diastology LVIDs:         4.60 cm  LV e' lateral:   7.29 cm/s LV PW:         1.00 cm  LV E/e' lateral: 9.5 LV IVS:        0.90 cm  LV e' medial:    3.37 cm/s LVOT diam:     2.10 cm  LV E/e' medial:  20.6 LV SV:         58 LV SV Index:   29 LVOT Area:     3.46 cm                          3D Volume EF:                         3D EF:        46 %                         LV EDV:       196 ml                         LV ESV:       105 ml  LV SV:        91 ml RIGHT VENTRICLE RV S prime:     9.46 cm/s TAPSE (M-mode): 2.3 cm LEFT ATRIUM             Index       RIGHT ATRIUM           Index LA diam:        3.50 cm 1.76 cm/m  RA Area:     19.00 cm LA Vol (A2C):   62.5 ml 31.40 ml/m RA Volume:   44.50 ml  22.35 ml/m LA Vol (A4C):   62.0 ml 31.14 ml/m LA Biplane Vol: 64.2 ml 32.25 ml/m  AORTIC VALVE LVOT Vmax:   71.10 cm/s LVOT Vmean:  52.200 cm/s LVOT VTI:    0.167 m  AORTA Ao Root diam: 3.50 cm Ao Asc diam:  3.80 cm MITRAL VALVE               TRICUSPID VALVE MV Area (PHT): 2.73 cm    TR Peak grad:   26.4 mmHg MV Decel Time: 278 msec    TR Vmax:        257.00 cm/s MV E velocity: 69.40 cm/s MV A velocity: 90.00 cm/s  SHUNTS MV E/A ratio:  0.77        Systemic VTI:  0.17 m                            Systemic Diam: 2.10 cm Arvilla Meres MD Electronically signed by Arvilla Meres MD Signature Date/Time: 03/24/2020/5:44:26 PM    Final     Cardiac Studies   Echo stable from prior, EF 45%.  Patient Profile     Nathaniel Carr. is a 54 y.o. male with a hx of HTN, HLD, anemia and no known cardiac hx. But NICM with EF 20-25% though improved with meds to 40-45% who is being seen today for the evaluation of pre-op eval for possible exploratory laparotomy if pt does not improve at the request of Dr. Corliss Skains and Dr. Natale Milch.  Assessment & Plan    1. Pre-op eval for possible exploratory Lap with SBO, NG on admission, now out.  Likely  intermediate risk for MACE due to underlying cardiomyopathy, however patient progressing well with conservative management for SBO.  2. NICM with last EF improved 45% on GDMT.  Resumed medications yesterday - statin, carvedilol, entresto, spironolactone, farxiga. Will restart imdur today now that NG is out. 3. Chronic systolic HF - currently euvolemic 4. HTN - continue home heart failure therapy. 5. HLD - statin     Cardiology will follow as needed for HF symptoms or if surgical management is planned.  For questions or updates, please contact [facility] Please consult [email or web address] for contact info under        Signed, Parke Poisson, MD  03/25/20 10:07 AM

## 2020-03-25 NOTE — Progress Notes (Signed)
PROGRESS NOTE    Nathaniel Carr.  BPZ:[id number] DOB: 07-24-1966 DOA: 03/23/2020 PCP: Patient, [name]   Brief Narrative:  Nathaniel Carr. is a 54 y.o. male with medical history significant of hypertension, hyperlipidemia, anemia, chronic systolic CHF secondary to nonischemic cardiomyopathy presenting to the ED via EMS with complaints of sudden onset right upper quadrant abdominal pain associated with nausea and vomiting.  Hypertensive with systolic in the 242P with EMS.  Patient states he was doing fine during the day and symptoms started all of a sudden at night.  Reports having regular bowel movements.  Denies history of abdominal surgeries.  Denies fevers, chills, cough, shortness of breath, or chest pain. In ED: Patient was found to be afebrile, non-tachycardic.  Blood pressure 181/100 on arrival.  Labs showing no leukocytosis.  Lipase normal.  T bili 1.7, remainder of LFTs normal.  Creatinine 1.4, no significant change from baseline.  Lactic acid 2.5.  UA not suggestive of infection.  SARS-CoV-2 PCR test negative. CT showing dilated small bowel loops with no clear transition point. NG tube placed and general surgery consulted. Patient was given Reglan, morphine, Compazine, and 1 L normal saline bolus.  Hospitalist was called to admit.  Assessment & Plan:   Principal Problem:   SBO (small bowel obstruction) (HCC) Active Problems:   Hypertensive urgency   Bilirubinemia   CHF (congestive heart failure) (HCC)   Possible SBO, POA, improving Intractable nausea, vomiting, pain; resolving POA  - CT showing dilated small bowel loops with no clear transition point.  No prior history of abdominal surgeries.  No signs of peritonitis.  Mild lactic acidosis likely related to abdominal pathology.   - NG tube placed in the ED 700 cc output immediately -downtrending output, NG now removed - tolerating PO per Sx - continue to advance as tolerated -General surgery following, appreciate  insight and recommendations  Hypertensive urgency resolving:  -In the setting of above systolic intractable pain -Blood pressure downtrending appropriately with pain control and reinitiation of home medications per cardiology  Hyperbilirubinemia:  - T bili 1.7/1.8, remainder of LFTs normal.  T bili was 2.0 in May 2020, likely a chronic issue - Majority of bilirubin is indirect  Chronic systolic CHF:  -Repeat echo 03/24/2020 EF remains around 45% with global hypokinesis and grade 1 diastolic dysfunction -Patient currently appears euvolemic, resume home core measures per cardiology  DVT prophylaxis: SCDs Code Status: Full code Family Communication: No family available this time. Disposition Plan:  Status is: Inpatient Dispo: The patient is from: Home              Anticipated d/c is to: Home              Anticipated d/c date is: 24-48 hours pending clinical course              Patient currently not medically stable for discharge due to ongoing need for work-up, diet advancement to ensure resolution of obstructive process as above.  Consultants:   General surgery, cardiology  Procedures:   Echocardiogram 03/24/2020  Antimicrobials:  Not currently indicated  Subjective: No acute issues or events overnight, NG tube now removed, tolerating p.o. liquids currently without issue, advancing to soft, patient otherwise denies chest pain, shortness of breath, nausea, vomiting, diarrhea, constipation, headache, fevers, chills..  Objective: Vitals:   03/24/20 0541 03/24/20 1514 03/24/20 2041 03/25/20 0424  BP: (!) 163/117 (!) 172/118 (!) 153/112 (!) 140/100  Pulse: 78 75 79 78  Resp:  18 17 17 16   Temp: 98.9 F (37.2 C) 98 F (36.7 C) 98.4 F (36.9 C) 98.4 F (36.9 C)  TempSrc:  Oral    SpO2: 98% 98% 96% 93%  Weight:      Height:        Intake/Output Summary (Last 24 hours) at 03/25/2020 0956 Last data filed at 03/25/2020 0600 Gross per 24 hour  Intake 360 ml  Output --  Net  360 ml   Filed Weights   03/23/20 0231  Weight: 85.3 kg    Examination:  General:  Pleasantly resting in bed, No acute distress. HEENT:  Normocephalic atraumatic.  Sclerae nonicteric, noninjected.  Extraocular movements intact bilaterally. Neck:  Without mass or deformity.  Trachea is midline. Lungs:  Clear to auscultate bilaterally without rhonchi, wheeze, or rales. Heart:  Regular rate and rhythm.  Without murmurs, rubs, or gallops. Abdomen:  Soft, minimally tender diffusely, nondistended.  Without guarding or rebound. Extremities: Without cyanosis, clubbing, edema, or obvious deformity. Vascular:  Dorsalis pedis and posterior tibial pulses palpable bilaterally. Skin:  Warm and dry, no erythema, no ulcerations.   Data Reviewed: I have personally reviewed following labs and imaging studies  CBC: Recent Labs  Lab 03/23/20 0232 03/24/20 0237 03/25/20 0207  WBC 7.2 11.0* 12.3*  NEUTROABS 6.2  --   --   HGB 15.1 15.4 16.2  HCT 45.1 45.2 48.8  MCV 96.8 95.4 96.3  PLT 246 223 226   Basic Metabolic Panel: Recent Labs  Lab 03/23/20 0232 03/24/20 0237 03/25/20 0207  NA 141 141 141  K 4.2 3.7 3.4*  CL 106 106 103  CO2 21* 22 24  GLUCOSE 144* 134* 123*  BUN 13 13 17   CREATININE 1.42* 1.36* 1.36*  CALCIUM 10.1 9.4 9.8   GFR: Estimated Creatinine Clearance: 68.3 mL/min (A) (by C-G formula based on SCr of 1.36 mg/dL (H)). Liver Function Tests: Recent Labs  Lab 03/23/20 0232 03/23/20 0840 03/24/20 0237 03/25/20 0207  AST 34  --  31 25  ALT 27  --  23 24  ALKPHOS 44  --  46 43  BILITOT 1.7* 1.4* 1.5* 1.8*  PROT 7.7  --  7.1 7.5  ALBUMIN 4.8  --  4.4 4.2   Recent Labs  Lab 03/23/20 0232  LIPASE 23   No results for input(s): AMMONIA in the last 168 hours. Coagulation Profile: No results for input(s): INR, PROTIME in the last 168 hours. Cardiac Enzymes: No results for input(s): CKTOTAL, CKMB, CKMBINDEX, TROPONINI in the last 168 hours. BNP (last 3  results) No results for input(s): PROBNP in the last 8760 hours. HbA1C: No results for input(s): HGBA1C in the last 72 hours. CBG: No results for input(s): GLUCAP in the last 168 hours. Lipid Profile: No results for input(s): CHOL, HDL, LDLCALC, TRIG, CHOLHDL, LDLDIRECT in the last 72 hours. Thyroid Function Tests: No results for input(s): TSH, T4TOTAL, FREET4, T3FREE, THYROIDAB in the last 72 hours. Anemia Panel: No results for input(s): VITAMINB12, FOLATE, FERRITIN, TIBC, IRON, RETICCTPCT in the last 72 hours. Sepsis Labs: Recent Labs  Lab 03/23/20 0301 03/23/20 0510  LATICACIDVEN 2.5* 1.9    Recent Results (from the past 240 hour(s))  SARS Coronavirus 2 by RT PCR (hospital order, performed in [facility] hospital lab) Nasopharyngeal Nasopharyngeal Swab     Status: None   Collection Time: 03/23/20  3:43 AM   Specimen: Nasopharyngeal Swab  Result Value Ref Range Status   SARS Coronavirus 2 NEGATIVE NEGATIVE Final  Comment: (NOTE) SARS-CoV-2 target nucleic acids are NOT DETECTED.  The SARS-CoV-2 RNA is generally detectable in upper and lower respiratory specimens during the acute phase of infection. The lowest concentration of SARS-CoV-2 viral copies this assay can detect is 250 copies / mL. A negative result does not preclude SARS-CoV-2 infection and should not be used as the sole basis for treatment or other patient management decisions.  A negative result may occur with improper specimen collection / handling, submission of specimen other than nasopharyngeal swab, presence of viral mutation(s) within the areas targeted by this assay, and inadequate number of viral copies (<250 copies / mL). A negative result must be combined with clinical observations, patient history, and epidemiological information.  Fact Sheet for Patients:   BoilerBrush.com.cy  Fact Sheet for Healthcare Providers: https://pope.com/  This test is  not yet approved or  cleared by the [place] FDA and has been authorized for detection and/or diagnosis of SARS-CoV-2 by FDA under an Emergency Use Authorization (EUA).  This EUA will remain in effect (meaning this test can be used) for the duration of the COVID-19 declaration under Section 564(b)(1) of the Act, 21 U.S.C. section 360bbb-3(b)(1), unless the authorization is terminated or revoked sooner.  Performed at [facility], [address], [place], [place] [address]          Radiology Studies: DG Abd Portable 1V  Result Date: 03/24/2020 CLINICAL DATA:  Small bowel obstruction EXAM: PORTABLE ABDOMEN - 1 VIEW COMPARISON:  Yesterday FINDINGS: No visible contrast within colon which remains similar caliber. There is still dilated small bowel loops in the central abdomen. Enteric tube in good position. No worrisome mass effect or gas collection IMPRESSION: Continued small bowel obstruction. Oral contrast still localizes to small bowel. Electronically Signed   By: Marnee Spring M.D.   On: 03/24/2020 10:36   DG Abd Portable 1V-Small Bowel Obstruction Protocol-initial, 8 hr delay  Result Date: 03/23/2020 CLINICAL DATA:  Small bowel obstructions EXAM: PORTABLE ABDOMEN - 1 VIEW COMPARISON:  03/23/2020 FINDINGS: Nasogastric tube is present. There is a small amount of inspissated barium in the stomach. Contrast medium is present in the urinary bladder, residual from recent CT examination. Most of the bowel is gasless. There is a mildly dilated loop of jejunum measuring 3.4 cm in diameter in the left upper quadrant. Partially transitional L5 vertebra with pseudoarticulation of the broad left transverse process of L5 with the sacrum. IMPRESSION: 1. Mildly dilated loop of left upper quadrant jejunum at 3.4 cm. Most of bowel is gasless. 2. Small amount of inspissated oral contrast medium in the stomach. No orally administered contrast is recognized in the bowel. Nasogastric tube is present  in the stomach. Electronically Signed   By: Gaylyn Rong M.D.   On: 03/23/2020 16:29   ECHOCARDIOGRAM COMPLETE  Result Date: 03/24/2020    ECHOCARDIOGRAM REPORT   Patient Name:   Braxton Vantrease. Date of Exam: 03/24/2020 Medical Rec #:  [id number]             Height:       68.0 in Accession #:    [id number]            Weight:       188.0 lb Date of Birth:  1965/10/10            BSA:          1.991 m Patient Age:    53 years  BP:           163/117 mmHg Patient Gender: M                     HR:           66 bpm. Exam Location:  Inpatient Procedure: 2D Echo, 3D Echo, Color Doppler and Cardiac Doppler Indications:    I42.9 Cardiomyopathy (unspecified), Mitral Insufficiency  History:        Patient has prior history of Echocardiogram examinations, most                 recent 08/17/2019. CHF; Risk Factors:Dyslipidemia and                 Hypertension.  Sonographer:    Irving Burton Senior RDCS Referring Phys: [id number] Parke Poisson IMPRESSIONS  1. Left ventricular ejection fraction, by estimation, is 45%. The left ventricle has mildly decreased function. The left ventricle demonstrates global hypokinesis. The left ventricular internal cavity size was mildly dilated. Left ventricular diastolic parameters are consistent with Grade I diastolic dysfunction (impaired relaxation).  2. Right ventricular systolic function is normal. The right ventricular size is normal. There is normal pulmonary artery systolic pressure.  3. Left atrial size was mild to moderately dilated.  4. The mitral valve is normal in structure. Mild to moderate mitral valve regurgitation.  5. The aortic valve is normal in structure. Aortic valve regurgitation is trivial.  6. The inferior vena cava is normal in size with greater than 50% respiratory variability, suggesting right atrial pressure of 3 mmHg. FINDINGS  Left Ventricle: Left ventricular ejection fraction, by estimation, is 45%. The left ventricle has mildly decreased function.  The left ventricle demonstrates global hypokinesis. The left ventricular internal cavity size was mildly dilated. There is no left ventricular hypertrophy. Left ventricular diastolic parameters are consistent with Grade I diastolic dysfunction (impaired relaxation). Right Ventricle: The right ventricular size is normal. No increase in right ventricular wall thickness. Right ventricular systolic function is normal. There is normal pulmonary artery systolic pressure. The tricuspid regurgitant velocity is 2.57 m/s, and  with an assumed right atrial pressure of 3 mmHg, the estimated right ventricular systolic pressure is 29.4 mmHg. Left Atrium: Left atrial size was mild to moderately dilated. Right Atrium: Right atrial size was normal in size. Pericardium: There is no evidence of pericardial effusion. Mitral Valve: The mitral valve is normal in structure. Mild to moderate mitral valve regurgitation. Tricuspid Valve: The tricuspid valve is normal in structure. Tricuspid valve regurgitation is mild. Aortic Valve: The aortic valve is normal in structure. Aortic valve regurgitation is trivial. There is mild calcification of the aortic valve. Pulmonic Valve: The pulmonic valve was normal in structure. Pulmonic valve regurgitation is trivial. Aorta: The aortic root and ascending aorta are structurally normal, with no evidence of dilitation. Venous: The inferior vena cava is normal in size with greater than 50% respiratory variability, suggesting right atrial pressure of 3 mmHg. IAS/Shunts: No atrial level shunt detected by color flow Doppler.  LEFT VENTRICLE PLAX 2D LVIDd:         6.00 cm  Diastology LVIDs:         4.60 cm  LV e' lateral:   7.29 cm/s LV PW:         1.00 cm  LV E/e' lateral: 9.5 LV IVS:        0.90 cm  LV e' medial:    3.37 cm/s LVOT diam:     2.10  cm  LV E/e' medial:  20.6 LV SV:         58 LV SV Index:   29 LVOT Area:     3.46 cm                          3D Volume EF:                         3D EF:         46 %                         LV EDV:       196 ml                         LV ESV:       105 ml                         LV SV:        91 ml RIGHT VENTRICLE RV S prime:     9.46 cm/s TAPSE (M-mode): 2.3 cm LEFT ATRIUM             Index       RIGHT ATRIUM           Index LA diam:        3.50 cm 1.76 cm/m  RA Area:     19.00 cm LA Vol (A2C):   62.5 ml 31.40 ml/m RA Volume:   44.50 ml  22.35 ml/m LA Vol (A4C):   62.0 ml 31.14 ml/m LA Biplane Vol: 64.2 ml 32.25 ml/m  AORTIC VALVE LVOT Vmax:   71.10 cm/s LVOT Vmean:  52.200 cm/s LVOT VTI:    0.167 m  AORTA Ao Root diam: 3.50 cm Ao Asc diam:  3.80 cm MITRAL VALVE               TRICUSPID VALVE MV Area (PHT): 2.73 cm    TR Peak grad:   26.4 mmHg MV Decel Time: 278 msec    TR Vmax:        257.00 cm/s MV E velocity: 69.40 cm/s MV A velocity: 90.00 cm/s  SHUNTS MV E/A ratio:  0.77        Systemic VTI:  0.17 m                            Systemic Diam: 2.10 cm Daniel Bensimhon MD Electronically signed by Daniel Bensimhon MD Signature Date/Time: 03/24/2020/5:44:26 PM    Final     Scheduled Meds: . atorvastatin  40 mg Per Tube Daily  . carvedilol  25 mg Per Tube BID WC  . dapagliflozin propanediol  10 mg Oral QAC breakfast  . hydrALAZINE  75 mg Per Tube TID  . sacubitril-valsartan  1 tablet Oral BID  . spironolactone  25 mg Per Tube Daily   Continuous Infusions:    LOS: 2 days   Time spent: <MEASUREM6578Bevelyn B86uFelDorMarland KitcheTLa 9358 LOGrove Hill Memorial HospitalTri County<MEASUREMEN6578Bevelyn B52uFelDorMarland KitcheTFo(8777LORoundup Memorial HealthcareMemorial Hospital Jac<MEASUREMEN6578Bevelyn B14uFelDorMarland KitcheTJack(4130 ULOUc Medical Center PsychiatricEast Central Regional<MEASUREMEN6578Bevelyn B78uFelDorMarland KitcheTWe(3LOEncompass Health Rehabilitation Hospital Of AltoonaWillingway<MEASUREMEN6578Bevelyn B21uFelDorMarland KitcheTWeLOPatients Choice Medical CenterSouth Austin Surgic<MEASUREMEN6578Bevelyn B53uFelDorMarland Kitch3779 SaLOMid-Valley HospitalEunice Extended Care<MEASUREMEN6578Bevelyn B5uFelDorMarland KitcheTWa(LOAmbulatory Surgery Center Of Centralia LLCThomas B Fin<MEASUREMEN6578Bevelyn B67uFelDorMarland KitcheLOAcadiana Endoscopy Center IncIra Davenport Memorial Hos<MEASUREMEN6578Bevelyn B33uFelDorMarland KitcheTPi(LOStanton County HospitalAugusta Medic<MEASUREMEN6578Bevelyn B22uFelDorMarland KitcheTD35LOAllendale County HospitalCoastal Digestive Care C<MEASUREMEN6578Bevelyn B37uFelDorMarland KitcheTM873 MaLOSkypark Surgery Center LLCCataract Institute Of Okl<MEASUREMEN6578Bevelyn B47uFelDorMarland KitcheTBe29867 ScLOInspira Medical Center VinelandSt. John Rehabilitation Hospital Affiliated With He<MEASUREMEN6578Bevelyn B40uFelDorMarland KitcheTSout28175 N. LOAria Health FrankfordSugarland Rehab<MEASUREMEN6578Bevelyn B71uFelDorMarland KitcheTLa5LOMaimonides Medical CenterSurgery Center Of Pott<MEASUREMEN6578Bevelyn B71uFelDorMarland KitchLOCentro Cardiovascular De Pr Y Caribe Dr Ramon M SuarezWoodhull Medical And Mental Heal<MEASUREMEN6578Bevelyn B21uFelDorMarland KitcheTHarmo24LOAlbany Medical CenterChi Health St<MEASUREMEN6578Bevelyn B41uFelDorMarland Kit77677LOBellevue HospitalAuestetic Plastic Surgery Center LP [facility] Ambulatory Surge<MEASUREMEN6578Bevelyn B26uFelDorMarland KitcheTFairle8LORiver Parishes HospitalUniversity Of Ky<MEASUREMEN6578Bevelyn B52uFelDorMarland KitcheTTaos Sk753 LOSurgery Center Of West Monroe LLCMethodist Hospital Uni<MEASUREMEN6578Bevelyn B29uFelDorMarland KitcheTGas8578 W.LOLufkin Endoscopy Center LtdOlin E. Teague Veterans' Medic<MEASUREMEN6578Bevelyn B47uFelDorMarland Kitch3210 WLOSt Francis Medical CenterSouthwestern Children'S Health Services, Inc ([place] He<MEASUREMEN6578Bevelyn B50uFelDorMarland Kitche(252LOChadron Community Hospital And Health ServicesGreat Plains Regional Medic<MEASUREMEN6578Bevelyn B12uFelDorMarland Kitche87557 PurpLOOrchard Surgical Center LLCCobre Valley Regional Medic<MEASUREMEN6578Bevelyn B65uFelDorMarland KitcheTC6983LOOrlando Health South Seminole HospitalNorthside Hospital -<MEASUREMEN6578Bevelyn B68uFelDorMarland KitcheTCan9LOTracy Surgery CenterGrand Itasca Clin<MEASUREMEN6578Bevelyn B29uFelDorMarland KitLOLogan Regional HospitalPromise Hospital Of Baton Ro<MEASUREMEN6578Bevelyn B66uFelDorMarland Kitch884LOWest Monroe Endoscopy Asc LLCGarrett E<MEASUREMEN6578Bevelyn B3uFelDorMarland Kitc99044 North VaLOCincinnati Children'S Hospital Medical Center At Lindner CenterCoastal Surgery C<MEASUREMEN6578Bevelyn B58uTFor<MEASUREMEN6578Bevelyn B36uFelDorMarland Kitch7134 WLOEast Metro Endoscopy Center LLCHonorhealth Deer Valley Medic<MEASUREMEN6578Bevelyn B73uFelDorMarland KitcLONorth Palm Beach County Surgery Center LLCMarengo Memorial<MEASUREMEN6578Bevelyn B35uFelDorMarland Kitc(LOVidant Medical CenterSouthern Tennessee Regional Health Syste<MEASUREMEN6667TGrass Ranch<MEASUREMEN6578Bevelyn B18uFelDorMarland KitLOSioux Center HealthPalomar Medic<MEASUREMEN6578Bevelyn B69uFelDorMarland KitcheTDee(709601 ELOHaven Behavioral ServicesUptown Healthcare Manag<MEASUREMEN6578Bevelyn B37uFelDorMarland KitcheTFLOAccess Hospital Dayton, LLCKaweah Delta Mental Health Hospita<MEASUREMEN6578Bevelyn B60uFelDorMarland Kitche(972 LOCamden Clark Medical CenterDel Amo Hospital[address]caster, DO [facility]  If 7PM-7AM, please contact night-coverage [email or web address]  03/25/2020, 9:56 AM

## 2020-03-26 LAB — CBC
HCT: 45.6 % (ref 39.0–52.0)
Hemoglobin: 15.2 g/dL (ref 13.0–17.0)
MCH: 32.2 pg (ref 26.0–34.0)
MCHC: 33.3 g/dL (ref 30.0–36.0)
MCV: 96.6 fL (ref 80.0–100.0)
Platelets: 223 10*3/uL (ref 150–400)
RBC: 4.72 MIL/uL (ref 4.22–5.81)
RDW: 13.2 % (ref 11.5–15.5)
WBC: 7.8 10*3/uL (ref 4.0–10.5)
nRBC: 0 % (ref 0.0–0.2)

## 2020-03-26 LAB — COMPREHENSIVE METABOLIC PANEL
ALT: 20 U/L (ref 0–44)
AST: 18 U/L (ref 15–41)
Albumin: 3.6 g/dL (ref 3.5–5.0)
Alkaline Phosphatase: 38 U/L (ref 38–126)
Anion gap: 8 (ref 5–15)
BUN: 34 mg/dL — ABNORMAL HIGH (ref 6–20)
CO2: 24 mmol/L (ref 22–32)
Calcium: 9 mg/dL (ref 8.9–10.3)
Chloride: 103 mmol/L (ref 98–111)
Creatinine, Ser: 1.59 mg/dL — ABNORMAL HIGH (ref 0.61–1.24)
GFR calc Af Amer: 57 mL/min — ABNORMAL LOW (ref >60)
GFR calc non Af Amer: 50 mL/min — ABNORMAL LOW (ref >60)
Glucose, Bld: 122 mg/dL — ABNORMAL HIGH (ref 70–99)
Potassium: 3.7 mmol/L (ref 3.5–5.1)
Sodium: 135 mmol/L (ref 135–145)
Total Bilirubin: 2 mg/dL — ABNORMAL HIGH (ref 0.3–1.2)
Total Protein: 6.3 g/dL — ABNORMAL LOW (ref 6.5–8.1)

## 2020-03-26 NOTE — Progress Notes (Addendum)
Subjective/Chief Complaint: Tolerating reg food, no nausea Wife/girlfriend, Nalisha, in room with patient.  Objective: Vital signs in last 24 hours: Temp:  [97.7 F (36.5 C)-98.2 F (36.8 C)] 98.2 F (36.8 C) (06/19 0425) Pulse Rate:  [71-84] 81 (06/19 0425) Resp:  [17] 17 (06/19 0425) BP: (83-104)/(49-71) 101/71 (06/19 0425) SpO2:  [96 %-99 %] 99 % (06/19 0425)   Intake/Output from previous day: 06/18 0701 - 06/19 0700 In: 520 [P.O.:520] Out: -  Intake/Output this shift: No intake/output data recorded.  GI: soft nt/nd bs present  Lab Results:  Recent Labs    03/25/20 0207 03/26/20 0450  WBC 12.3* 7.8  HGB 16.2 15.2  HCT 48.8 45.6  PLT 226 223   BMET Recent Labs    03/25/20 0207 03/26/20 0450  NA 141 135  K 3.4* 3.7  CL 103 103  CO2 24 24  GLUCOSE 123* 122*  BUN 17 34*  CREATININE 1.36* 1.59*  CALCIUM 9.8 9.0   PT/INR No results for input(s): LABPROT, INR in the last 72 hours. ABG No results for input(s): PHART, HCO3 in the last 72 hours.  Invalid input(s): PCO2, PO2  Studies/Results: DG Abd Portable 1V  Result Date: 03/24/2020 CLINICAL DATA:  Small bowel obstruction EXAM: PORTABLE ABDOMEN - 1 VIEW COMPARISON:  Yesterday FINDINGS: No visible contrast within colon which remains similar caliber. There is still dilated small bowel loops in the central abdomen. Enteric tube in good position. No worrisome mass effect or gas collection IMPRESSION: Continued small bowel obstruction. Oral contrast still localizes to small bowel. Electronically Signed   By: Monte Fantasia M.D.   On: 03/24/2020 10:36   ECHOCARDIOGRAM COMPLETE  Result Date: 03/24/2020    ECHOCARDIOGRAM REPORT   Patient Name:   Nathaniel Carr. Date of Exam: 03/24/2020 Medical Rec #:  [id number]             Height:       68.0 in Accession #:    [id number]            Weight:       188.0 lb Date of Birth:  1966-07-07            BSA:          1.991 m Patient Age:    54 years              BP:            163/117 mmHg Patient Gender: M                     HR:           66 bpm. Exam Location:  Inpatient Procedure: 2D Echo, 3D Echo, Color Doppler and Cardiac Doppler Indications:    I42.9 Cardiomyopathy (unspecified), Mitral Insufficiency  History:        Patient has prior history of Echocardiogram examinations, most                 recent 08/17/2019. CHF; Risk Factors:Dyslipidemia and                 Hypertension.  Sonographer:    Raquel Sarna Senior RDCS Referring Phys: [id number] [place]  1. Left ventricular ejection fraction, by estimation, is 45%. The left ventricle has mildly decreased function. The left ventricle demonstrates global hypokinesis. The left ventricular internal cavity size was mildly dilated. Left ventricular diastolic parameters are consistent with Grade I diastolic dysfunction (impaired relaxation).  2.  Right ventricular systolic function is normal. The right ventricular size is normal. There is normal pulmonary artery systolic pressure.  3. Left atrial size was mild to moderately dilated.  4. The mitral valve is normal in structure. Mild to moderate mitral valve regurgitation.  5. The aortic valve is normal in structure. Aortic valve regurgitation is trivial.  6. The inferior vena cava is normal in size with greater than 50% respiratory variability, suggesting right atrial pressure of 3 mmHg. FINDINGS  Left Ventricle: Left ventricular ejection fraction, by estimation, is 45%. The left ventricle has mildly decreased function. The left ventricle demonstrates global hypokinesis. The left ventricular internal cavity size was mildly dilated. There is no left ventricular hypertrophy. Left ventricular diastolic parameters are consistent with Grade I diastolic dysfunction (impaired relaxation). Right Ventricle: The right ventricular size is normal. No increase in right ventricular wall thickness. Right ventricular systolic function is normal. There is normal pulmonary artery systolic  pressure. The tricuspid regurgitant velocity is 2.57 m/s, and  with an assumed right atrial pressure of 3 mmHg, the estimated right ventricular systolic pressure is 29.4 mmHg. Left Atrium: Left atrial size was mild to moderately dilated. Right Atrium: Right atrial size was normal in size. Pericardium: There is no evidence of pericardial effusion. Mitral Valve: The mitral valve is normal in structure. Mild to moderate mitral valve regurgitation. Tricuspid Valve: The tricuspid valve is normal in structure. Tricuspid valve regurgitation is mild. Aortic Valve: The aortic valve is normal in structure. Aortic valve regurgitation is trivial. There is mild calcification of the aortic valve. Pulmonic Valve: The pulmonic valve was normal in structure. Pulmonic valve regurgitation is trivial. Aorta: The aortic root and ascending aorta are structurally normal, with no evidence of dilitation. Venous: The inferior vena cava is normal in size with greater than 50% respiratory variability, suggesting right atrial pressure of 3 mmHg. IAS/Shunts: No atrial level shunt detected by color flow Doppler.  LEFT VENTRICLE PLAX 2D LVIDd:         6.00 cm  Diastology LVIDs:         4.60 cm  LV e' lateral:   7.29 cm/s LV PW:         1.00 cm  LV E/e' lateral: 9.5 LV IVS:        0.90 cm  LV e' medial:    3.37 cm/s LVOT diam:     2.10 cm  LV E/e' medial:  20.6 LV SV:         58 LV SV Index:   29 LVOT Area:     3.46 cm                          3D Volume EF:                         3D EF:        46 %                         LV EDV:       196 ml                         LV ESV:       105 ml                         LV SV:  91 ml RIGHT VENTRICLE RV S prime:     9.46 cm/s TAPSE (M-mode): 2.3 cm LEFT ATRIUM             Index       RIGHT ATRIUM           Index LA diam:        3.50 cm 1.76 cm/m  RA Area:     19.00 cm LA Vol (A2C):   62.5 ml 31.40 ml/m RA Volume:   44.50 ml  22.35 ml/m LA Vol (A4C):   62.0 ml 31.14 ml/m LA Biplane Vol: 64.2 ml  32.25 ml/m  AORTIC VALVE LVOT Vmax:   71.10 cm/s LVOT Vmean:  52.200 cm/s LVOT VTI:    0.167 m  AORTA Ao Root diam: 3.50 cm Ao Asc diam:  3.80 cm MITRAL VALVE               TRICUSPID VALVE MV Area (PHT): 2.73 cm    TR Peak grad:   26.4 mmHg MV Decel Time: 278 msec    TR Vmax:        257.00 cm/s MV E velocity: 69.40 cm/s MV A velocity: 90.00 cm/s  SHUNTS MV E/A ratio:  0.77        Systemic VTI:  0.17 m                            Systemic Diam: 2.10 cm Arvilla Meres MD Electronically signed by Arvilla Meres MD Signature Date/Time: 03/24/2020/5:44:26 PM    Final     Anti-infectives: Anti-infectives (From admission, onward)   None      Assessment/Plan: HTN urgency Hyperbilirubinemia CHF Elevated Creatinine  1.59 - 03/26/2020  Nausea, vomiting, possible SBO  -clinically doing well  -no prior surgery  FEN - soft diet VTE - ok for chemical ppx from our standpoint ID - none currently needed  Plan:  Okay to discharge from our standpoint  No reason for follow up with surgery  Kandis Cocking 03/26/2020

## 2020-03-26 NOTE — Discharge Summary (Signed)
Physician Discharge Summary  Nathaniel Carr. XHB:[id number] DOB: [birth year] DOA: 03/23/2020  PCP: Patient, [name]  Admit date: 03/23/2020 Discharge date: 03/26/2020  Admitted From: home Disposition:  home  Recommendations for Outpatient Follow-up:  1. Follow up with PCP in 1-2 weeks 2. Please obtain BMP/CBC in one week  Discharge Condition:Stable  CODE STATUS:Full  Diet recommendation: As tolerated   Brief/Interim Summary: Nathaniel Carris a 53 y.o.malewith medical history significant ofhypertension, hyperlipidemia, anemia, chronic systolic CHF secondary to nonischemic cardiomyopathy presenting to the ED via EMS with complaints of sudden onset right upper quadrant abdominal pain associated with nauseaand vomiting. Hypertensive with systolic in the 200s with EMS.Patient states he was doing fine during the day and symptoms started all of a sudden at night. Reports having regular bowel movements. Denies history of abdominal surgeries. Denies fevers, chills, cough, shortness of breath, or chest pain. In ED: Patient was found to be afebrile, non-tachycardic. Blood pressure 181/100 on arrival. Labs showing no leukocytosis. Lipase normal. T bili 1.7, remainder of LFTs normal. Creatinine 1.4, no significant change from baseline. Lactic acid 2.5. UA not suggestive of infection. SARS-CoV-2 PCR test negative. CT showing dilated small bowel loops with no clear transition point. NG tube placed and general surgery consulted. Patient was given Reglan, morphine, Compazine, and 1 L normal saline bolus.  Hospitalist was called to admit.  Patient admitted as above with acute intractable N/V and abdominal pain found to have partial SBO on imaging - Surgery following but clinically patient resolved with NG tube, supportive care and NPO status over the following 48h. Now tolerating PO without difficulty, no longer having symptoms of N/V and otherwise stable and agreeable for  DC.   Discharge Diagnoses:  Principal Problem:   SBO (small bowel obstruction) (HCC) Active Problems:   Hypertensive urgency   Bilirubinemia   CHF (congestive heart failure) [facility])    Discharge Instructions  Discharge Instructions    Call MD for:  difficulty breathing, headache or visual disturbances   Complete by: As directed    Call MD for:  extreme fatigue   Complete by: As directed    Call MD for:  hives   Complete by: As directed    Call MD for:  persistant dizziness or light-headedness   Complete by: As directed    Call MD for:  persistant nausea and vomiting   Complete by: As directed    Call MD for:  severe uncontrolled pain   Complete by: As directed    Call MD for:  temperature >100.4   Complete by: As directed    Diet - low sodium heart healthy   Complete by: As directed    Increase activity slowly   Complete by: As directed      Allergies as of 03/26/2020      Reactions   Shellfish-derived Products Shortness Of Breath, Nausea And Vomiting   Can tolerate grilled shrimp   Codeine Nausea Only   Grass Extracts [gramineae Pollens] Nausea Only   Penicillins Nausea Only   Has patient had a PCN reaction causing immediate rash, facial/tongue/throat swelling, SOB or lightheadedness with hypotension: No Has patient had a PCN reaction causing severe rash involving mucus membranes or skin necrosis: No Has patient had a PCN reaction that required hospitalization: Unk Has patient had a PCN reaction occurring within the last 10 years: From childhood If all of the above answers are "NO", then may proc      Medication List  TAKE these medications   aspirin 81 MG EC tablet Commonly known as: SM Aspirin Adult Low Strength Take 1 tablet (81 mg total) by mouth daily. Swallow whole.   aspirin-acetaminophen-caffeine 250-250-65 MG tablet Commonly known as: EXCEDRIN MIGRAINE Take 2 tablets by mouth every 6 (six) hours as needed for headache or migraine.   atorvastatin  40 MG tablet Commonly known as: LIPITOR Take 1 tablet (40 mg total) by mouth daily.   carvedilol 25 MG tablet Commonly known as: COREG Take 1 tablet (25 mg total) by mouth 2 (two) times daily with a meal.   dapagliflozin propanediol 10 MG Tabs tablet Commonly known as: FARXIGA Take 10 mg by mouth daily before breakfast.   hydrALAZINE 50 MG tablet Commonly known as: APRESOLINE Take 1.5 tablets (75 mg total) by mouth 3 (three) times daily.   isosorbide mononitrate 60 MG 24 hr tablet Commonly known as: IMDUR Take 1.5 tablets (90 mg total) by mouth daily.   sacubitril-valsartan 97-103 MG Commonly known as: ENTRESTO Take 1 tablet by mouth 2 (two) times daily.   spironolactone 25 MG tablet Commonly known as: ALDACTONE Take 1 tablet (25 mg total) by mouth daily.   VITAMIN D-3 PO Take 1 capsule by mouth 4 (four) times a week.       Allergies  Allergen Reactions  . Shellfish-Derived Products Shortness Of Breath and Nausea And Vomiting    Can tolerate grilled shrimp  . Codeine Nausea Only  . Grass Extracts [Gramineae Pollens] Nausea Only  . Penicillins Nausea Only    Has patient had a PCN reaction causing immediate rash, facial/tongue/throat swelling, SOB or lightheadedness with hypotension: No Has patient had a PCN reaction causing severe rash involving mucus membranes or skin necrosis: No Has patient had a PCN reaction that required hospitalization: Unk Has patient had a PCN reaction occurring within the last 10 years: From childhood If all of the above answers are "NO", then may proc    Consultations:  Gen Surgery  Procedures/Studies: CT ABDOMEN PELVIS W CONTRAST  Result Date: 03/23/2020 CLINICAL DATA:  Right upper quadrant pain, nausea, vomiting EXAM: CT ABDOMEN AND PELVIS WITH CONTRAST TECHNIQUE: Multidetector CT imaging of the abdomen and pelvis was performed using the standard protocol following bolus administration of intravenous contrast. CONTRAST:   OMNIPAQUE IOHEXOL 300 MG/ML  SOLN COMPARISON:  None. FINDINGS: Lower chest: Lung bases are clear. No effusions. Heart is normal size. Hepatobiliary: Diffuse fatty infiltration of the liver. No focal hepatic abnormality. Gallbladder unremarkable. Pancreas: No focal abnormality or ductal dilatation. Spleen: No focal abnormality.  Normal size. Adrenals/Urinary Tract: No adrenal abnormality. No focal renal abnormality. No stones or hydronephrosis. Urinary bladder is unremarkable. Stomach/Bowel: Colonic diverticulosis. No active diverticulitis. Normal appendix. Mildly prominent fluid-filled mid abdominal small bowel loops. Exact transition is not visualized, but distal small bowel loops are decompressed. There is edema within the r small bowel mesentery involving both dilated and nondilated bowel loops. Vascular/Lymphatic: Aortic atherosclerosis. No enlarged abdominal or pelvic lymph nodes. Reproductive: No visible focal abnormality. Other: No free fluid or free air. Musculoskeletal: No acute bony abnormality. IMPRESSION: Diffuse fatty infiltration of the liver. Prominent dilated fluid-filled small bowel loops in the mid and lower abdomen. Distal small bowel loops are decompressed. Exact transition is not visualized, there is edema within the small bowel mesentery. Findings are concerning for small bowel obstruction. Exact cause not visualize, but given the edema within the small bowel mesentery, cannot exclude vascular compromise, possibly from internal hernia. Electronically Signed   By:  Charlett Nose M.D.   On: 03/23/2020 03:10   DG Abd Portable 1V  Result Date: 03/24/2020 CLINICAL DATA:  Small bowel obstruction EXAM: PORTABLE ABDOMEN - 1 VIEW COMPARISON:  Yesterday FINDINGS: No visible contrast within colon which remains similar caliber. There is still dilated small bowel loops in the central abdomen. Enteric tube in good position. No worrisome mass effect or gas collection IMPRESSION: Continued small bowel  obstruction. Oral contrast still localizes to small bowel. Electronically Signed   By: Marnee Spring M.D.   On: 03/24/2020 10:36   DG Abd Portable 1V-Small Bowel Obstruction Protocol-initial, 8 hr delay  Result Date: 03/23/2020 CLINICAL DATA:  Small bowel obstructions EXAM: PORTABLE ABDOMEN - 1 VIEW COMPARISON:  03/23/2020 FINDINGS: Nasogastric tube is present. There is a small amount of inspissated barium in the stomach. Contrast medium is present in the urinary bladder, residual from recent CT examination. Most of the bowel is gasless. There is a mildly dilated loop of jejunum measuring 3.4 cm in diameter in the left upper quadrant. Partially transitional L5 vertebra with pseudoarticulation of the broad left transverse process of L5 with the sacrum. IMPRESSION: 1. Mildly dilated loop of left upper quadrant jejunum at 3.4 cm. Most of bowel is gasless. 2. Small amount of inspissated oral contrast medium in the stomach. No orally administered contrast is recognized in the bowel. Nasogastric tube is present in the stomach. Electronically Signed   By: Gaylyn Rong M.D.   On: 03/23/2020 16:29   DG Abd Portable 1V-Small Bowel Protocol-Position Verification  Result Date: 03/23/2020 CLINICAL DATA:  Nasogastric tube placement EXAM: PORTABLE ABDOMEN - 1 VIEW COMPARISON:  Earlier today FINDINGS: Nasogastric tube with tip and side-port over the stomach. Contrast is being excreted from the kidneys. No interval finding from prior CT. IMPRESSION: Nasogastric tube in good position. Electronically Signed   By: Marnee Spring M.D.   On: 03/23/2020 05:15   ECHOCARDIOGRAM COMPLETE  Result Date: 03/24/2020    ECHOCARDIOGRAM REPORT   Patient Name:   Nathaniel Safley. Date of Exam: 03/24/2020 Medical Rec #:  [id number]             Height:       68.0 in Accession #:    [id number]            Weight:       188.0 lb Date of Birth:  January 31, 1966            BSA:          1.991 m Patient Age:    53 years              BP:            163/117 mmHg Patient Gender: M                     HR:           66 bpm. Exam Location:  Inpatient Procedure: 2D Echo, 3D Echo, Color Doppler and Cardiac Doppler Indications:    I42.9 Cardiomyopathy (unspecified), Mitral Insufficiency  History:        Patient has prior history of Echocardiogram examinations, most                 recent 08/17/2019. CHF; Risk Factors:Dyslipidemia and                 Hypertension.  Sonographer:    Irving Burton Senior RDCS Referring Phys: [id number] Parke Poisson IMPRESSIONS  1. Left  ventricular ejection fraction, by estimation, is 45%. The left ventricle has mildly decreased function. The left ventricle demonstrates global hypokinesis. The left ventricular internal cavity size was mildly dilated. Left ventricular diastolic parameters are consistent with Grade I diastolic dysfunction (impaired relaxation).  2. Right ventricular systolic function is normal. The right ventricular size is normal. There is normal pulmonary artery systolic pressure.  3. Left atrial size was mild to moderately dilated.  4. The mitral valve is normal in structure. Mild to moderate mitral valve regurgitation.  5. The aortic valve is normal in structure. Aortic valve regurgitation is trivial.  6. The inferior vena cava is normal in size with greater than 50% respiratory variability, suggesting right atrial pressure of 3 mmHg. FINDINGS  Left Ventricle: Left ventricular ejection fraction, by estimation, is 45%. The left ventricle has mildly decreased function. The left ventricle demonstrates global hypokinesis. The left ventricular internal cavity size was mildly dilated. There is no left ventricular hypertrophy. Left ventricular diastolic parameters are consistent with Grade I diastolic dysfunction (impaired relaxation). Right Ventricle: The right ventricular size is normal. No increase in right ventricular wall thickness. Right ventricular systolic function is normal. There is normal pulmonary artery systolic  pressure. The tricuspid regurgitant velocity is 2.57 m/s, and  with an assumed right atrial pressure of 3 mmHg, the estimated right ventricular systolic pressure is 56.3 mmHg. Left Atrium: Left atrial size was mild to moderately dilated. Right Atrium: Right atrial size was normal in size. Pericardium: There is no evidence of pericardial effusion. Mitral Valve: The mitral valve is normal in structure. Mild to moderate mitral valve regurgitation. Tricuspid Valve: The tricuspid valve is normal in structure. Tricuspid valve regurgitation is mild. Aortic Valve: The aortic valve is normal in structure. Aortic valve regurgitation is trivial. There is mild calcification of the aortic valve. Pulmonic Valve: The pulmonic valve was normal in structure. Pulmonic valve regurgitation is trivial. Aorta: The aortic root and ascending aorta are structurally normal, with no evidence of dilitation. Venous: The inferior vena cava is normal in size with greater than 50% respiratory variability, suggesting right atrial pressure of 3 mmHg. IAS/Shunts: No atrial level shunt detected by color flow Doppler.  LEFT VENTRICLE PLAX 2D LVIDd:         6.00 cm  Diastology LVIDs:         4.60 cm  LV e' lateral:   7.29 cm/s LV PW:         1.00 cm  LV E/e' lateral: 9.5 LV IVS:        0.90 cm  LV e' medial:    3.37 cm/s LVOT diam:     2.10 cm  LV E/e' medial:  20.6 LV SV:         58 LV SV Index:   29 LVOT Area:     3.46 cm                          3D Volume EF:                         3D EF:        46 %                         LV EDV:       196 ml  LV ESV:       105 ml                         LV SV:        91 ml RIGHT VENTRICLE RV S prime:     9.46 cm/s TAPSE (M-mode): 2.3 cm LEFT ATRIUM             Index       RIGHT ATRIUM           Index LA diam:        3.50 cm 1.76 cm/m  RA Area:     19.00 cm LA Vol (A2C):   62.5 ml 31.40 ml/m RA Volume:   44.50 ml  22.35 ml/m LA Vol (A4C):   62.0 ml 31.14 ml/m LA Biplane Vol: 64.2 ml  32.25 ml/m  AORTIC VALVE LVOT Vmax:   71.10 cm/s LVOT Vmean:  52.200 cm/s LVOT VTI:    0.167 m  AORTA Ao Root diam: 3.50 cm Ao Asc diam:  3.80 cm MITRAL VALVE               TRICUSPID VALVE MV Area (PHT): 2.73 cm    TR Peak grad:   26.4 mmHg MV Decel Time: 278 msec    TR Vmax:        257.00 cm/s MV E velocity: 69.40 cm/s MV A velocity: 90.00 cm/s  SHUNTS MV E/A ratio:  0.77        Systemic VTI:  0.17 m                            Systemic Diam: 2.10 cm Arvilla Meres MD Electronically signed by Arvilla Meres MD Signature Date/Time: 03/24/2020/5:44:26 PM    Final       Subjective: Mild low BP (asymptomatic) overnight but tolerating PO well otherwise denies, N/V   Discharge Exam: Vitals:   03/25/20 2255 03/26/20 0425  BP: 104/63 101/71  Pulse: 71 81  Resp:  17  Temp:  98.2 F (36.8 C)  SpO2:  99%   Vitals:   03/25/20 0424 03/25/20 2058 03/25/20 2255 03/26/20 0425  BP: (!) 140/100 (!) 83/49 104/63 101/71  Pulse: 78 84 71 81  Resp: 16 17  17   Temp: 98.4 F (36.9 C) 97.7 F (36.5 C)  98.2 F (36.8 C)  TempSrc:  Oral  Oral  SpO2: 93% 96%  99%  Weight:      Height:        General: Pt is alert, awake, not in acute distress Cardiovascular: RRR, S1/S2 +, no rubs, no gallops Respiratory: CTA bilaterally, no wheezing, no rhonchi Abdominal: Soft, NT, ND, bowel sounds + Extremities: no edema, no cyanosis    The results of significant diagnostics from this hospitalization (including imaging, microbiology, ancillary and laboratory) are listed below for reference.     Microbiology: Recent Results (from the past 240 hour(s))  SARS Coronavirus 2 by RT PCR (hospital order, performed in [facility] hospital lab) Nasopharyngeal Nasopharyngeal Swab     Status: None   Collection Time: 03/23/20  3:43 AM   Specimen: Nasopharyngeal Swab  Result Value Ref Range Status   SARS Coronavirus 2 NEGATIVE NEGATIVE Final    Comment: (NOTE) SARS-CoV-2 target nucleic acids are NOT DETECTED.  The  SARS-CoV-2 RNA is generally detectable in upper and lower respiratory specimens during the acute phase of infection. The lowest concentration of SARS-CoV-2  viral copies this assay can detect is 250 copies / mL. A negative result does not preclude SARS-CoV-2 infection and should not be used as the sole basis for treatment or other patient management decisions.  A negative result may occur with improper specimen collection / handling, submission of specimen other than nasopharyngeal swab, presence of viral mutation(s) within the areas targeted by this assay, and inadequate number of viral copies (<250 copies / mL). A negative result must be combined with clinical observations, patient history, and epidemiological information.  Fact Sheet for Patients:   BoilerBrush.com.cy  Fact Sheet for Healthcare Providers: https://pope.com/  This test is not yet approved or  cleared by the [place] FDA and has been authorized for detection and/or diagnosis of SARS-CoV-2 by FDA under an Emergency Use Authorization (EUA).  This EUA will remain in effect (meaning this test can be used) for the duration of the COVID-19 declaration under Section 564(b)(1) of the Act, 21 U.S.C. section 360bbb-3(b)(1), unless the authorization is terminated or revoked sooner.  Performed at [facility], [address], [place], [place] [address]      Labs: BNP (last 3 results) No results for input(s): BNP in the last 8760 hours. Basic Metabolic Panel: Recent Labs  Lab 03/23/20 0232 03/24/20 0237 03/25/20 0207 03/26/20 0450  NA 141 141 141 135  K 4.2 3.7 3.4* 3.7  CL 106 106 103 103  CO2 21* GLUCOSE 144* 134* 123* 122*  BUN 34*  CREATININE 1.42* 1.36* 1.36* 1.59*  CALCIUM 10.1 9.4 9.8 9.0   Liver Function Tests: Recent Labs  Lab 03/23/20 0232 03/23/20 0840 03/24/20 0237 03/25/20 0207 03/26/20 0450  AST 34  --  ALT 27  --  ALKPHOS 44  --  46 43 38  BILITOT 1.7* 1.4* 1.5* 1.8* 2.0*  PROT 7.7  --  7.1 7.5 6.3*  ALBUMIN 4.8  --  4.4 4.2 3.6   Recent Labs  Lab 03/23/20 0232  LIPASE 23   No results for input(s): AMMONIA in the last 168 hours. CBC: Recent Labs  Lab 03/23/20 0232 03/24/20 0237 03/25/20 0207 03/26/20 0450  WBC 7.2 11.0* 12.3* 7.8  NEUTROABS 6.2  --   --   --   HGB 15.1 15.4 16.2 15.2  HCT 45.1 45.2 48.8 45.6  MCV 96.8 95.4 96.3 96.6  PLT 246 223 226 223   Cardiac Enzymes: No results for input(s): CKTOTAL, CKMB, CKMBINDEX, TROPONINI in the last 168 hours. BNP: Invalid input(s): POCBNP CBG: No results for input(s): GLUCAP in the last 168 hours. D-Dimer No results for input(s): DDIMER in the last 72 hours. Hgb A1c No results for input(s): HGBA1C in the last 72 hours. Lipid Profile No results for input(s): CHOL, HDL, LDLCALC, TRIG, CHOLHDL, LDLDIRECT in the last 72 hours. Thyroid function studies No results for input(s): TSH, T4TOTAL, T3FREE, THYROIDAB in the last 72 hours.  Invalid input(s): FREET3 Anemia work up No results for input(s): VITAMINB12, FOLATE, FERRITIN, TIBC, IRON, RETICCTPCT in the last 72 hours. Urinalysis    Component Value Date/Time   COLORURINE YELLOW 03/23/2020 0343   APPEARANCEUR CLEAR 03/23/2020 0343   LABSPEC 1.024 03/23/2020 0343   PHURINE 7.0 03/23/2020 0343   GLUCOSEU 50 (A) 03/23/2020 0343   HGBUR SMALL (A) 03/23/2020 0343   BILIRUBINUR NEGATIVE 03/23/2020 0343   KETONESUR 20 (A) 03/23/2020 0343   PROTEINUR 100 (A) 03/23/2020 0343   NITRITE  NEGATIVE 03/23/2020 0343   LEUKOCYTESUR NEGATIVE 03/23/2020 0343   Sepsis Labs Invalid input(s): PROCALCITONIN,  WBC,  LACTICIDVEN Microbiology Recent Results (from the past 240 hour(s))  SARS Coronavirus 2 by RT PCR (hospital order, performed in [facility] hospital lab) Nasopharyngeal Nasopharyngeal Swab     Status: None   Collection Time: 03/23/20  3:43 AM   Specimen:  Nasopharyngeal Swab  Result Value Ref Range Status   SARS Coronavirus 2 NEGATIVE NEGATIVE Final    Comment: (NOTE) SARS-CoV-2 target nucleic acids are NOT DETECTED.  The SARS-CoV-2 RNA is generally detectable in upper and lower respiratory specimens during the acute phase of infection. The lowest concentration of SARS-CoV-2 viral copies this assay can detect is 250 copies / mL. A negative result does not preclude SARS-CoV-2 infection and should not be used as the sole basis for treatment or other patient management decisions.  A negative result may occur with improper specimen collection / handling, submission of specimen other than nasopharyngeal swab, presence of viral mutation(s) within the areas targeted by this assay, and inadequate number of viral copies (<250 copies / mL). A negative result must be combined with clinical observations, patient history, and epidemiological information.  Fact Sheet for Patients:   BoilerBrush.com.cy  Fact Sheet for Healthcare Providers: https://pope.com/  This test is not yet approved or  cleared by the [place] FDA and has been authorized for detection and/or diagnosis of SARS-CoV-2 by FDA under an Emergency Use Authorization (EUA).  This EUA will remain in effect (meaning this test can be used) for the duration of the COVID-19 declaration under Section 564(b)(1) of the Act, 21 U.S.C. section 360bbb-3(b)(1), unless the authorization is terminated or revoked sooner.  Performed at [facility], [address] [place], [place] [address]      Time coordinating discharge: Over 30 minutes  SIGNED:   Azucena Fallen, DO [facility] 03/26/2020, 10:46 AM Pager   If 7PM-7AM, please contact night-coverage [email or web address]

## 2020-04-05 ENCOUNTER — Telehealth (HOSPITAL_COMMUNITY): Payer: Self-pay | Admitting: Pharmacy Technician

## 2020-04-05 NOTE — Telephone Encounter (Signed)
I got a notification that Mr. Nathaniel Carr co-pay card did not work at the pharmacy. His insurance is not paying anything towards what would be his out of pocket cost of the medication.  Due to this, we are going to change him to [place] (which insurance does pay towards). I was already able to obtain a $25 co-pay card on his behalf.  Pharmacy Billing Information  BIN: [id number]  PCN: [id number]  Group: [id number]  ID: [id number]  Called and left patient a message regarding medication change. Provided pharmacy with co-pay card information.  Archer Asa, CPhT

## 2020-04-06 ENCOUNTER — Other Ambulatory Visit (HOSPITAL_COMMUNITY): Payer: Self-pay | Admitting: Cardiology

## 2020-04-06 MED FILL — ISOSORBIDE MN ER 60 MG TAB: 60 | 30 days supply | Qty: 45 | Fill #3

## 2020-04-06 MED FILL — SM ASPIRIN EC 81 MG TABLET: 81 | 30 days supply | Qty: 30 | Fill #2

## 2020-04-06 MED FILL — CARVEDILOL 25 MG TABLET: 25 | 30 days supply | Qty: 60 | Fill #4

## 2020-04-06 MED FILL — SPIRONOLACTONE 25 MG TABS: 25 | 30 days supply | Qty: 30 | Fill #6

## 2020-04-06 MED FILL — hydrALAZINE HCL 50 MG TABS: 50 | 30 days supply | Qty: 135 | Fill #0

## 2020-04-06 MED FILL — ATORVASTATIN 40 MG TABLET: 40 | 30 days supply | Qty: 30 | Fill #9

## 2020-04-14 MED FILL — ATORVASTATIN 40 MG TABLET: 40 | 30 days supply | Qty: 30 | Fill #9

## 2020-04-14 MED FILL — CARVEDILOL 25 MG TABLET: 25 | 30 days supply | Qty: 60 | Fill #4

## 2020-04-14 MED FILL — SPIRONOLACTONE 25 MG TABS: 25 | 30 days supply | Qty: 30 | Fill #6

## 2020-04-14 MED FILL — ISOSORBIDE MN ER 60 MG TAB: 60 | 30 days supply | Qty: 45 | Fill #3

## 2020-04-15 MED FILL — hydrALAZINE HCL 50 MG TABS: 50 | 30 days supply | Qty: 135 | Fill #0

## 2020-05-02 MED FILL — SM ASPIRIN EC 81 MG TABLET: 81 | 30 days supply | Qty: 30 | Fill #3

## 2020-05-09 MED FILL — SPIRONOLACTONE 25 MG TABS: 25 | 30 days supply | Qty: 30 | Fill #7

## 2020-05-09 MED FILL — CARVEDILOL 25 MG TABLET: 25 | 30 days supply | Qty: 60 | Fill #5

## 2020-05-09 MED FILL — ATORVASTATIN 40 MG TABLET: 40 | 30 days supply | Qty: 30 | Fill #10

## 2020-05-30 ENCOUNTER — Ambulatory Visit (HOSPITAL_COMMUNITY)
Admission: RE | Admit: 2020-05-30 | Discharge: 2020-05-30 | Disposition: A | Discharge status: Home / Self Care | Payer: 59 | Source: Ambulatory Visit | Attending: Cardiology | Admitting: Cardiology

## 2020-05-30 ENCOUNTER — Other Ambulatory Visit: Payer: Self-pay

## 2020-05-30 VITALS — BP 127/85 | HR 62 | Wt 184.4 lb

## 2020-05-30 DIAGNOSIS — Z8249 Family history of ischemic heart disease and other diseases of the circulatory system: Secondary | ICD-10-CM | POA: Diagnosis not present

## 2020-05-30 DIAGNOSIS — Z7982 Long term (current) use of aspirin: Secondary | ICD-10-CM | POA: Diagnosis not present

## 2020-05-30 DIAGNOSIS — I428 Other cardiomyopathies: Secondary | ICD-10-CM | POA: Insufficient documentation

## 2020-05-30 DIAGNOSIS — I110 Hypertensive heart disease with heart failure: Secondary | ICD-10-CM | POA: Insufficient documentation

## 2020-05-30 DIAGNOSIS — I5022 Chronic systolic (congestive) heart failure: Secondary | ICD-10-CM | POA: Insufficient documentation

## 2020-05-30 DIAGNOSIS — E785 Hyperlipidemia, unspecified: Secondary | ICD-10-CM | POA: Insufficient documentation

## 2020-05-30 DIAGNOSIS — Z7984 Long term (current) use of oral hypoglycemic drugs: Secondary | ICD-10-CM | POA: Diagnosis not present

## 2020-05-30 DIAGNOSIS — I739 Peripheral vascular disease, unspecified: Secondary | ICD-10-CM | POA: Diagnosis not present

## 2020-05-30 DIAGNOSIS — M79604 Pain in right leg: Secondary | ICD-10-CM | POA: Diagnosis not present

## 2020-05-30 DIAGNOSIS — Z79899 Other long term (current) drug therapy: Secondary | ICD-10-CM | POA: Insufficient documentation

## 2020-05-30 LAB — BASIC METABOLIC PANEL
Anion gap: 7 (ref 5–15)
BUN: 17 mg/dL (ref 6–20)
CO2: 26 mmol/L (ref 22–32)
Calcium: 9.2 mg/dL (ref 8.9–10.3)
Chloride: 108 mmol/L (ref 98–111)
Creatinine, Ser: 1.41 mg/dL — ABNORMAL HIGH (ref 0.61–1.24)
GFR calc Af Amer: >60 mL/min (ref >60)
GFR calc non Af Amer: 57 mL/min — ABNORMAL LOW (ref >60)
Glucose, Bld: 107 mg/dL — ABNORMAL HIGH (ref 70–99)
Potassium: 3.9 mmol/L (ref 3.5–5.1)
Sodium: 141 mmol/L (ref 135–145)

## 2020-05-30 LAB — LIPID PANEL
Cholesterol: 134 mg/dL (ref 0–200)
HDL: 51 mg/dL (ref >40)
LDL Cholesterol: 73 mg/dL (ref 0–99)
Total CHOL/HDL Ratio: 2.6 RATIO
Triglycerides: 52 mg/dL (ref <150)
VLDL: 10 mg/dL (ref 0–40)

## 2020-05-30 MED FILL — ISOSORBIDE MN ER 60 MG TAB: 60 | 30 days supply | Qty: 45 | Fill #4

## 2020-05-30 NOTE — Progress Notes (Signed)
PCP: Patient, [name] Primary Cardiologist: Dr Shirlee Latch   HPI: Nathaniel Carr. is a 54 y.o. male with a history of HTN, HLD, and anemia. No known prior cardiac history. He had an uncle with heart failure.   Admitted 02/21/2019 with dyspnea and cough. CTA negative for PE, COVID-19 negative. Diagnosed with CHF, diuresed with IV lasix. Echo showed newly reduced EF 20-25%, so HF team was consulted. He underwent RHC/LHC that showed preserved cardiac output, no CAD, and optimized filling pressures post-diuresis. HF meds started. Discharge weight 192.5 pounds.   Echo in 9/20 showed EF remains 25-30% with mild LV dilation.   Echo in 11/20 showed EF 40-45%, mild LVH, mildly decreased RV systolic function, mild-moderate MR.   He returns for followup of CHF.  He was admitted in 6/21 with partial SBO, uncertain cause.  No further abdominal pain.  Weight is down 12 lbs.  No significant exertional dyspnea.  No chest pain. No lightheadedness.  No orthopnea/PND.  He has numbness/tingling in his right foot.    Labs (5/20): LDL 181 Labs (7/20): K 4.3, creatinine 1.4 Labs (10/20): K 4, creatinine 1.42 => 1.84, LDL 90 Labs (1/21): K 3.9, creatinine 1.35 Labs (6/21): K 3.7, creatinine 1.59  PMH: 1. Hyperlipidemia 2. HTN 3. Chronic systolic CHF: Nonischemic cardiomyopathy.  Echo (5/20) with EF 20-25%, normal RV.  - RHC/LHC (5/20): No significant coronary disease.  Mean RA 4, PA 29/17 mean 21, mean PCWP 10, CI 2.93.  - Cardiac MRI (5/20): Moderate LV dilation with diffuse, severe hypokinesis, EF 17%. Normal RV size and systolic function, EF 46%.Moderate MR and moderate TR.  Uninterpretable delayed enhancement images.  - Echo (9/20): EF 25-30%, mild LV dilation, moderate MR, moderate LAE.  - Echo (11/20): EF 40-45%, mild LVH, mildly decreased RV systolic function, mild-moderate MR.  - CPX (11/20): peak VO2 23.7, VE/VCO2 slope 41, RER 1.1.  Mild-moderate HF limitation, restrictive lung physiology  likely due to body habitus.  4. Partial SBO: 6/21.   ROS: All systems negative except as listed in HPI, PMH and Problem List.  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  Substance and Sexual Activity   Alcohol use: No   Drug use: No   Sexual activity: Yes  Other Topics Concern   Not on file  Social History Narrative   Not on file   Social Determinants of Health   Financial Resource Strain:    Difficulty of Paying Living Expenses: Not on file  Food Insecurity:    Worried About Programme researcher, broadcasting/film/video in the Last Year: Not on file   [employer] of Food in the Last Year: Not on file  Transportation Needs:    Lack of Transportation (Medical): Not on file   Lack of Transportation (Non-Medical): Not on file  Physical Activity:    Days of Exercise per Week: Not on file   Minutes of Exercise per Session: Not on file  Stress:    Feeling of Stress : Not on file  Social Connections:    Frequency of Communication with Friends  and Family: Not on file   Frequency of Social Gatherings with Friends and Family: Not on file   Attends Religious Services: Not on file   Active Member of Clubs or Organizations: Not on file   Attends Banker Meetings: Not on file   Marital Status: Not on file  Intimate Partner Violence:    Fear of Current or Ex-Partner: Not on file   Emotionally Abused: Not on file   Physically Abused: Not on file   Sexually Abused: Not on file    FH:  Family History  Problem Relation Age of Onset   Hypertension Paternal Grandfather      Current Outpatient Medications    Medication Sig Dispense Refill   aspirin (SM ASPIRIN ADULT LOW STRENGTH) 81 MG EC tablet Take 1 tablet (81 mg total) by mouth daily. Swallow whole. 30 tablet 5   aspirin-acetaminophen-caffeine (EXCEDRIN MIGRAINE) 250-250-65 MG per tablet Take 2 tablets by mouth every 6 (six) hours as needed for headache or migraine.      atorvastatin (LIPITOR) 40 MG tablet Take 1 tablet (40 mg total) by mouth daily. 30 tablet 11   carvedilol (COREG) 25 MG tablet Take 1 tablet (25 mg total) by mouth 2 (two) times daily with a meal. 180 tablet 3   Cholecalciferol (VITAMIN D-3 PO) Take 1 capsule by mouth 4 (four) times a week.      dapagliflozin propanediol (FARXIGA) 10 MG TABS tablet Take 10 mg by mouth daily before breakfast. 30 tablet 5   hydrALAZINE (APRESOLINE) 50 MG tablet TAKE 1 & 1/2 TABLETS BY MOUTH 3 TIMES DAILY 135 tablet 3   isosorbide mononitrate (IMDUR) 60 MG 24 hr tablet Take 1.5 tablets (90 mg total) by mouth daily. 45 tablet 5   sacubitril-valsartan (ENTRESTO) 97-103 MG Take 1 tablet by mouth 2 (two) times daily. 180 tablet 3   spironolactone (ALDACTONE) 25 MG tablet Take 1 tablet (25 mg total) by mouth daily. 30 tablet 11   No current facility-administered medications for this encounter.    Vitals:   05/30/20 0937  BP: 127/85  Pulse: 62  SpO2: 100%  Weight: 83.6 kg (184 lb 6.4 oz)   Wt Readings from Last 3 Encounters:  05/30/20 83.6 kg (184 lb 6.4 oz)  03/23/20 85.3 kg (188 lb)  02/01/20 88.9 kg (196 lb)    PHYSICAL EXAM: General: NAD Neck: No JVD, no thyromegaly or thyroid nodule.  Lungs: Clear to auscultation bilaterally with normal respiratory effort. CV: Nondisplaced PMI.  Heart regular S1/S2, no S3/S4, 1/6 SEM RUSB.  No peripheral edema.  No carotid bruit.  Unable to palpate right PT pulse.  Abdomen: Soft, nontender, no hepatosplenomegaly, no distention.  Skin: Intact without lesions or rashes.  Neurologic: Alert and oriented x 3.  Psych: Normal  affect. Extremities: No clubbing or cyanosis.  HEENT: Normal.   ASSESSMENT & PLAN: 1. Chronic systolic CHF:  Nonischemic cardiomyopathy.  02/22/2019 Echo with EF 20-25%, diffuse hypokinesis, normal RV, moderate MR. Cause uncertain. Does have history of HTN. Cath showed no significant coronary disease and preserved cardiac output. Uncle has CHF, no other family members. No ETOH/drugs. TSH was ok. No definite pre-existing URI/viral symptoms. Cardiac MRI in 5/20 showed LVEF 17%, RV normal. Unfortunately, delayed enhancement images were uninterpretable.  Echo in 9/20 showed EF 25-30%, but echo in 11/20 showed EF back up to 40-45%. CPX with mild-moderate HF limitation. NYHA class II symptoms, not volume overloaded on exam.   - Continue Coreg 25 mg bid.   -  Continue Entresto 97-103 twice a day. - Continue spironolactone 25 mg daily. BMET today.  - Continue hydralazine 75 mg tid and Imdur 90 mg daily.  - Continue dapagliflozin 10 mg daily.  - Last EF out of ICD range.  Repeat echo in 3 months at followup.    2. HTN: BP controlled.     3. Hyperlipidemia: Check lipids today.  4. PAD: Difficult to palpate right PT, has tingling in this foot.  - ABIs to assess for PAD.    Followup in 3 months with echo.    Marca Ancona 05/30/2020

## 2020-05-30 NOTE — Patient Instructions (Addendum)
Labs done today  Your physician has requested that you have an ankle brachial index (ABI). During this test an ultrasound and blood pressure cuff are used to evaluate the arteries that supply the arms and legs with blood. Allow thirty minutes for this exam. There are no restrictions or special instructions. [facility] WILL CALL YOU TO SCHEDULE THIS APPOINTMENT  Your physician recommends that you schedule a follow-up appointment in: 3 months with an echocardiogram  If you have any questions or concerns before your next appointment please send [place] a message through [place] or call our office at [phone].    TO LEAVE A MESSAGE FOR THE NURSE SELECT OPTION 2, PLEASE LEAVE A MESSAGE INCLUDING: . YOUR NAME . DATE OF BIRTH . CALL BACK NUMBER . REASON FOR CALL**this is important as we prioritize the call backs  YOU WILL RECEIVE A CALL BACK THE SAME DAY AS LONG AS YOU CALL BEFORE 4:00 PM  At the [facility], you and your health needs are our priority. As part of our continuing mission to provide you with exceptional heart care, we have created designated Provider Care Teams. These Care Teams include your primary Cardiologist (physician) and Advanced Practice Providers (APPs- Physician Assistants and Nurse Practitioners) who all work together to provide you with the care you need, when you need it.   You may see any of the following providers on your designated Care Team at your next follow up: Marland Kitchen Dr Arvilla Meres . Dr Marca Ancona . Tonye Becket, NP . Robbie Lis, PA . Karle Plumber, PharmD   Please be sure to bring in all your medications bottles to every appointment.

## 2020-06-03 MED FILL — CARVEDILOL 25 MG TABLET: 25 | 30 days supply | Qty: 60 | Fill #6

## 2020-06-03 MED FILL — hydrALAZINE HCL 50 MG TABS: 50 | 30 days supply | Qty: 135 | Fill #1

## 2020-06-03 MED FILL — SPIRONOLACTONE 25 MG TABS: 25 | 30 days supply | Qty: 30 | Fill #8

## 2020-06-03 MED FILL — ATORVASTATIN 40 MG TABLET: 40 | 30 days supply | Qty: 30 | Fill #11

## 2020-06-03 MED FILL — ASPIRIN 81 MG TBEC: 81 | 30 days supply | Qty: 30 | Fill #4

## 2020-06-20 ENCOUNTER — Other Ambulatory Visit (HOSPITAL_COMMUNITY): Payer: Self-pay | Admitting: Cardiology

## 2020-06-20 DIAGNOSIS — M79604 Pain in right leg: Secondary | ICD-10-CM

## 2020-06-20 DIAGNOSIS — R0989 Other specified symptoms and signs involving the circulatory and respiratory systems: Secondary | ICD-10-CM

## 2020-06-27 ENCOUNTER — Encounter (HOSPITAL_COMMUNITY): Payer: 59

## 2020-07-07 ENCOUNTER — Other Ambulatory Visit: Payer: Self-pay

## 2020-07-07 ENCOUNTER — Ambulatory Visit (HOSPITAL_COMMUNITY)
Admission: RE | Admit: 2020-07-07 | Discharge: 2020-07-07 | Disposition: A | Discharge status: Home / Self Care | Payer: 59 | Source: Ambulatory Visit | Attending: Cardiology | Admitting: Cardiology

## 2020-07-07 DIAGNOSIS — M79604 Pain in right leg: Secondary | ICD-10-CM | POA: Diagnosis not present

## 2020-07-07 DIAGNOSIS — R0989 Other specified symptoms and signs involving the circulatory and respiratory systems: Secondary | ICD-10-CM | POA: Diagnosis not present

## 2020-07-12 ENCOUNTER — Other Ambulatory Visit (HOSPITAL_COMMUNITY): Payer: Self-pay | Admitting: Cardiology

## 2020-07-12 MED FILL — ASPIRIN LOW DOSE 81 MG TBEC: 81 | 30 days supply | Qty: 30 | Fill #5

## 2020-07-12 MED FILL — SPIRONOLACTONE 25 MG TABS: 25 | 30 days supply | Qty: 30 | Fill #0

## 2020-07-12 MED FILL — ISOSORBIDE MN ER 60 MG TAB: 60 | 30 days supply | Qty: 45 | Fill #5

## 2020-07-12 MED FILL — hydrALAZINE HCL 50 MG TABS: 50 | 30 days supply | Qty: 135 | Fill #2

## 2020-07-12 MED FILL — ATORVASTATIN 40 MG TABLET: 40 | 30 days supply | Qty: 30 | Fill #0

## 2020-07-13 MED FILL — CARVEDILOL 25 MG TABLET: 25 | 30 days supply | Qty: 60 | Fill #7

## 2020-07-14 ENCOUNTER — Encounter (HOSPITAL_COMMUNITY): Payer: Self-pay

## 2020-08-19 ENCOUNTER — Other Ambulatory Visit (HOSPITAL_COMMUNITY): Payer: Self-pay | Admitting: Cardiology

## 2020-08-19 MED FILL — ASPIRIN LOW DOSE 81 MG TBEC: 81 | 30 days supply | Qty: 30 | Fill #0

## 2020-08-19 MED FILL — CARVEDILOL 25 MG TABLET: 25 | 30 days supply | Qty: 60 | Fill #8

## 2020-08-22 ENCOUNTER — Ambulatory Visit (HOSPITAL_BASED_OUTPATIENT_CLINIC_OR_DEPARTMENT_OTHER)
Admission: RE | Admit: 2020-08-22 | Discharge: 2020-08-22 | Disposition: A | Discharge status: Home / Self Care | Payer: 59 | Source: Ambulatory Visit | Attending: Cardiology | Admitting: Cardiology

## 2020-08-22 ENCOUNTER — Other Ambulatory Visit (HOSPITAL_COMMUNITY): Payer: Self-pay | Admitting: Cardiology

## 2020-08-22 ENCOUNTER — Other Ambulatory Visit: Payer: Self-pay

## 2020-08-22 ENCOUNTER — Ambulatory Visit (HOSPITAL_COMMUNITY)
Admission: RE | Admit: 2020-08-22 | Discharge: 2020-08-22 | Disposition: A | Discharge status: Home / Self Care | Payer: 59 | Source: Ambulatory Visit | Attending: Cardiology | Admitting: Cardiology

## 2020-08-22 ENCOUNTER — Encounter (HOSPITAL_COMMUNITY): Payer: Self-pay | Admitting: Cardiology

## 2020-08-22 VITALS — BP 124/82 | HR 78 | Wt 178.0 lb

## 2020-08-22 DIAGNOSIS — E785 Hyperlipidemia, unspecified: Secondary | ICD-10-CM | POA: Diagnosis not present

## 2020-08-22 DIAGNOSIS — Z79899 Other long term (current) drug therapy: Secondary | ICD-10-CM | POA: Diagnosis not present

## 2020-08-22 DIAGNOSIS — R202 Paresthesia of skin: Secondary | ICD-10-CM | POA: Insufficient documentation

## 2020-08-22 DIAGNOSIS — D649 Anemia, unspecified: Secondary | ICD-10-CM | POA: Insufficient documentation

## 2020-08-22 DIAGNOSIS — Z7982 Long term (current) use of aspirin: Secondary | ICD-10-CM | POA: Diagnosis not present

## 2020-08-22 DIAGNOSIS — Z7984 Long term (current) use of oral hypoglycemic drugs: Secondary | ICD-10-CM | POA: Insufficient documentation

## 2020-08-22 DIAGNOSIS — Z8249 Family history of ischemic heart disease and other diseases of the circulatory system: Secondary | ICD-10-CM | POA: Diagnosis not present

## 2020-08-22 DIAGNOSIS — I5022 Chronic systolic (congestive) heart failure: Secondary | ICD-10-CM

## 2020-08-22 DIAGNOSIS — I110 Hypertensive heart disease with heart failure: Secondary | ICD-10-CM | POA: Insufficient documentation

## 2020-08-22 LAB — BASIC METABOLIC PANEL
Anion gap: 10 (ref 5–15)
BUN: 15 mg/dL (ref 6–20)
CO2: 23 mmol/L (ref 22–32)
Calcium: 9.4 mg/dL (ref 8.9–10.3)
Chloride: 108 mmol/L (ref 98–111)
Creatinine, Ser: 1.3 mg/dL — ABNORMAL HIGH (ref 0.61–1.24)
GFR, Estimated: >60 mL/min (ref >60)
Glucose, Bld: 95 mg/dL (ref 70–99)
Potassium: 4.2 mmol/L (ref 3.5–5.1)
Sodium: 141 mmol/L (ref 135–145)

## 2020-08-22 LAB — ECHOCARDIOGRAM COMPLETE
AR max vel: 2.18 cm2
AV Peak grad: 15.1 mmHg
Ao pk vel: 1.94 m/s
Area-P 1/2: 3.27 cm2
P 1/2 time: 852 msec
S' Lateral: 3.7 cm

## 2020-08-22 MED ORDER — DAPAGLIFLOZIN PROPANEDIOL 10 MG PO TABS: 10.0000 mg | ORAL_TABLET | Freq: Every day | ORAL | 5 refills | Status: DC

## 2020-08-22 MED ORDER — CARVEDILOL 25 MG PO TABS
25.0000 mg | ORAL_TABLET | Freq: Two times a day (BID) | ORAL | 3 refills | Status: DC
Start: 2020-08-22 — End: 2020-08-22

## 2020-08-22 MED ORDER — SPIRONOLACTONE 25 MG PO TABS
25.0000 mg | ORAL_TABLET | Freq: Every day | ORAL | 11 refills | Status: DC
Start: 2020-08-22 — End: 2022-09-14

## 2020-08-22 MED ORDER — HYDRALAZINE HCL 50 MG PO TABS: ORAL_TABLET | ORAL | 3 refills | Status: DC

## 2020-08-22 MED ORDER — SACUBITRIL-VALSARTAN 97-103 MG PO TABS: 1.0000 | ORAL_TABLET | Freq: Two times a day (BID) | ORAL | 3 refills | Status: DC

## 2020-08-22 MED ORDER — ASPIRIN 81 MG PO TBEC: 81.0000 mg | DELAYED_RELEASE_TABLET | Freq: Every day | ORAL | 5 refills | Status: DC

## 2020-08-22 MED ORDER — ASPIRIN-ACETAMINOPHEN-CAFFEINE 250-250-65 MG PO TABS: 2.0000 | ORAL_TABLET | Freq: Four times a day (QID) | ORAL | 3 refills | Status: AC | PRN

## 2020-08-22 MED ORDER — ATORVASTATIN CALCIUM 40 MG PO TABS: 40.0000 mg | ORAL_TABLET | Freq: Every day | ORAL | 11 refills | Status: DC

## 2020-08-22 MED FILL — SPIRONOLACTONE 25 MG TABS: 25 | 30 days supply | Qty: 30 | Fill #1

## 2020-08-22 MED FILL — ATORVASTATIN 40 MG TABLET: 40 | 30 days supply | Qty: 30 | Fill #1

## 2020-08-22 MED FILL — ISOSORBIDE MN ER 60 MG TAB: 60 | 30 days supply | Qty: 45 | Fill #0

## 2020-08-22 NOTE — Patient Instructions (Addendum)
Refills on your medications have been sent to your pharmacy  Labs done today, your results will be available in MyChart, we will contact you for abnormal readings.  You have been referred to [place] [facility] for PCP appt. Their office will contact you to schedule an appointment  Your physician recommends that you schedule repeat labs in 3 months  Please call our office in May 2022 to schedule your follow up appointment  If you have any questions or concerns before your next appointment please send [place] a message through [place] or call our office at [phone].    TO LEAVE A MESSAGE FOR THE NURSE SELECT OPTION 2, PLEASE LEAVE A MESSAGE INCLUDING: . YOUR NAME . DATE OF BIRTH . CALL BACK NUMBER . REASON FOR CALL**this is important as we prioritize the call backs  YOU WILL RECEIVE A CALL BACK THE SAME DAY AS LONG AS YOU CALL BEFORE 4:00 PM

## 2020-08-22 NOTE — Progress Notes (Signed)
  Echocardiogram 2D Echocardiogram has been performed.  Nathaniel Carr 08/22/2020, 9:55 AM

## 2020-08-22 NOTE — Progress Notes (Signed)
PCP: Patient, [name] Primary Cardiologist: Dr Shirlee Latch   HPI: Nathaniel Carr. is a 54 y.o. male with a history of HTN, HLD, and anemia. No known prior cardiac history. He had an uncle with heart failure.   Admitted 02/21/2019 with dyspnea and cough. CTA negative for PE, COVID-19 negative. Diagnosed with CHF, diuresed with IV lasix. Echo showed newly reduced EF 20-25%, so HF team was consulted. He underwent RHC/LHC that showed preserved cardiac output, no CAD, and optimized filling pressures post-diuresis. HF meds started. Discharge weight 192.5 pounds.   Echo in 9/20 showed EF remains 25-30% with mild LV dilation.   Echo in 11/20 showed EF 40-45%, mild LVH, mildly decreased RV systolic function, mild-moderate MR.  Echo was done today and reviewed, EF 55% with mild LVH, normal RV.   He returns for followup of CHF. No significant exertional dyspnea, no chest pain.  No orthopnea/PND.  Weight down 6 lbs.  He does have some tingling in his fingers, thought to possibly have carpal tunnel syndrome.    Labs (5/20): LDL 181 Labs (7/20): K 4.3, creatinine 1.4 Labs (10/20): K 4, creatinine 1.42 => 1.84, LDL 90 Labs (1/21): K 3.9, creatinine 1.35 Labs (6/21): K 3.7, creatinine 1.59 Labs (8/21): LDL 73, K 3.9, creatinine 1.4  PMH: 1. Hyperlipidemia 2. HTN 3. Chronic systolic CHF: Nonischemic cardiomyopathy.  Echo (5/20) with EF 20-25%, normal RV.  - RHC/LHC (5/20): No significant coronary disease.  Mean RA 4, PA 29/17 mean 21, mean PCWP 10, CI 2.93.  - Cardiac MRI (5/20): Moderate LV dilation with diffuse, severe hypokinesis, EF 17%. Normal RV size and systolic function, EF 46%.Moderate MR and moderate TR.  Uninterpretable delayed enhancement images.  - Echo (9/20): EF 25-30%, mild LV dilation, moderate MR, moderate LAE.  - Echo (11/20): EF 40-45%, mild LVH, mildly decreased RV systolic function, mild-moderate MR.  - CPX (11/20): peak VO2 23.7, VE/VCO2 slope 41, RER 1.1.  Mild-moderate  HF limitation, restrictive lung physiology likely due to body habitus.  - Echo (11/21): EF 55%, mild LVH, normal RV size and systolic function, PASP 32 mmHg.  4. Partial SBO: 6/21.  5. ABIs (9/21): Normal.   ROS: All systems negative except as listed in HPI, PMH and Problem List.  SH:  Social History   Socioeconomic History  . Marital status: Married    Spouse name: Not on file  . Number of children: Not on file  . Years of education: Not on file  . Highest education level: Not on file  Occupational History  . Not on file  Tobacco Use  . Smoking status: Never Smoker  . Smokeless tobacco: Never Used  . 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 Alcohol use: No  . Drug use: No  . Sexual activity: Yes  Other Topics Concern  . Not on file  Social History Narrative  . Not on file   Social Determinants of Health   Financial Resource Strain:   . Difficulty of Paying Living Expenses: Not on file  Food Insecurity:   . Worried About Programme researcher, broadcasting/film/video in the Last Year: Not on file  . Ran Out of Food in the Last Year: Not on file  Transportation Needs:   . Lack of Transportation (Medical): Not on file  . Lack of Transportation (Non-Medical): Not on file  Physical Activity:   . Days of Exercise per Week: Not on file  .  Minutes of Exercise per Session: Not on file  Stress:   . Feeling of Stress : Not on file  Social Connections:   . Frequency of Communication with Friends and Family: Not on file  . Frequency of Social Gatherings with Friends and Family: Not on file  . Attends Religious Services: Not on file  . Active Member of Clubs or Organizations: Not on file  . Attends Banker Meetings: Not on file  . Marital Status: Not on file  Intimate Partner Violence:   . Fear of Current or Ex-Partner: Not on file  . Emotionally Abused: Not on file  . Physically Abused: Not on file  . Sexually Abused:  Not on file    FH:  Family History  Problem Relation Age of Onset  . Hypertension Paternal Grandfather      Current Outpatient Medications  Medication Sig Dispense Refill  . aspirin (ASPIRIN LOW DOSE) 81 MG EC tablet Take 1 tablet (81 mg total) by mouth daily. Swallow whole. 30 tablet 5  . aspirin-acetaminophen-caffeine (EXCEDRIN MIGRAINE) 250-250-65 MG tablet Take 2 tablets by mouth every 6 (six) hours as needed for headache or migraine. 30 tablet 3  . atorvastatin (LIPITOR) 40 MG tablet Take 1 tablet (40 mg total) by mouth daily. 30 tablet 11  . carvedilol (COREG) 25 MG tablet Take 1 tablet (25 mg total) by mouth 2 (two) times daily with a meal. 180 tablet 3  . Cholecalciferol (VITAMIN D-3 PO) Take 1 capsule by mouth 4 (four) times a week.     . dapagliflozin propanediol (FARXIGA) 10 MG TABS tablet Take 1 tablet (10 mg total) by mouth daily before breakfast. 30 tablet 5  . hydrALAZINE (APRESOLINE) 50 MG tablet TAKE 1 & 1/2 TABLETS BY MOUTH 3 TIMES DAILY 135 tablet 3  . sacubitril-valsartan (ENTRESTO) 97-103 MG Take 1 tablet by mouth 2 (two) times daily. 180 tablet 3  . spironolactone (ALDACTONE) 25 MG tablet Take 1 tablet (25 mg total) by mouth daily. 30 tablet 11  . isosorbide mononitrate (IMDUR) 60 MG 24 hr tablet TAKE 1 & 1/2 TABLETS BY MOUTH DAILY 45 tablet 5   No current facility-administered medications for this encounter.    Vitals:   08/22/20 0957  BP: 124/82  Pulse: 78  SpO2: 98%  Weight: 80.7 kg (178 lb)   Wt Readings from Last 3 Encounters:  08/22/20 80.7 kg (178 lb)  05/30/20 83.6 kg (184 lb 6.4 oz)  03/23/20 85.3 kg (188 lb)    PHYSICAL EXAM: General: NAD Neck: No JVD, no thyromegaly or thyroid nodule.  Lungs: Clear to auscultation bilaterally with normal respiratory effort. CV: Nondisplaced PMI.  Heart regular S1/S2, no S3/S4, no murmur.  No peripheral edema.  No carotid bruit.  Normal pedal pulses.  Abdomen: Soft, nontender, no hepatosplenomegaly, no  distention.  Skin: Intact without lesions or rashes.  Neurologic: Alert and oriented x 3.  Psych: Normal affect. Extremities: No clubbing or cyanosis.  HEENT: Normal.   ASSESSMENT & PLAN: 1. Chronic systolic CHF:  Nonischemic cardiomyopathy.  02/22/2019 Echo with EF 20-25%, diffuse hypokinesis, normal RV, moderate MR. Cause uncertain. Does have history of HTN. Cath showed no significant coronary disease and preserved cardiac output. Uncle has CHF, no other family members. No ETOH/drugs. TSH was ok. No definite pre-existing URI/viral symptoms. Cardiac MRI in 5/20 showed LVEF 17%, RV normal. Unfortunately, delayed enhancement images were uninterpretable.  Echo in 9/20 showed EF 25-30%, echo in 11/20 showed EF back up to 40-45%, and  echo today showed EF up to 55%.  NYHA class I-II symptoms, not volume overloaded on exam.   - Continue Coreg 25 mg bid.   - Continue Entresto 97-103 twice a day. - Continue spironolactone 25 mg daily. BMET today.  - Continue hydralazine 75 mg tid and Imdur 90 mg daily.  - Continue dapagliflozin 10 mg daily.  - Has some tingling in fingers consistent with carpal tunnel syndrome, has some LVH on echo but suspect less likely TTR amyloidosis and more likely due to HTN.   2. HTN: BP controlled.     3. Hyperlipidemia: Good lipids in 8/21.   Followup in 6 months with BMET again in 3 months.   Nathaniel Carr 08/22/2020

## 2020-08-26 MED FILL — hydrALAZINE HCL 50 MG TABS: 50 | 30 days supply | Qty: 135 | Fill #0

## 2020-09-26 MED FILL — CARVEDILOL 25 MG TABLET: 25 | 30 days supply | Qty: 60 | Fill #9

## 2020-09-26 MED FILL — ATORVASTATIN 40 MG TABLET: 40 | 30 days supply | Qty: 30 | Fill #2

## 2020-09-26 MED FILL — ASPIRIN LOW DOSE 81 MG TBEC: 81 | 30 days supply | Qty: 30 | Fill #1

## 2020-09-26 MED FILL — SPIRONOLACTONE 25 MG TABS: 25 | 30 days supply | Qty: 30 | Fill #2

## 2020-10-03 MED FILL — hydrALAZINE HCL 50 MG TABS: 50 | 30 days supply | Qty: 135 | Fill #1

## 2020-10-26 MED FILL — SPIRONOLACTONE 25 MG TABS: 25 | 30 days supply | Qty: 30 | Fill #3

## 2020-10-26 MED FILL — ASPIRIN LOW DOSE 81 MG TBEC: 81 | 30 days supply | Qty: 30 | Fill #2

## 2020-10-26 MED FILL — CARVEDILOL 25 MG TABLET: 25 | 30 days supply | Qty: 60 | Fill #10

## 2020-10-26 MED FILL — ATORVASTATIN 40 MG TABLET: 40 | 30 days supply | Qty: 30 | Fill #3

## 2020-10-26 MED FILL — ISOSORBIDE MONONITRATE ER 6: 60 | 30 days supply | Qty: 45 | Fill #1

## 2020-11-08 MED FILL — hydrALAZINE HCL 50 MG TABS: 50 | 30 days supply | Qty: 135 | Fill #3

## 2020-11-21 ENCOUNTER — Other Ambulatory Visit: Payer: Self-pay

## 2020-11-21 ENCOUNTER — Other Ambulatory Visit (HOSPITAL_COMMUNITY): Payer: Self-pay | Admitting: Cardiology

## 2020-11-21 ENCOUNTER — Ambulatory Visit (HOSPITAL_COMMUNITY)
Admission: RE | Admit: 2020-11-21 | Discharge: 2020-11-21 | Disposition: A | Discharge status: Home / Self Care | Payer: 59 | Source: Ambulatory Visit | Attending: Internal Medicine | Admitting: Internal Medicine

## 2020-11-21 DIAGNOSIS — I5022 Chronic systolic (congestive) heart failure: Secondary | ICD-10-CM | POA: Insufficient documentation

## 2020-11-21 LAB — BASIC METABOLIC PANEL
Anion gap: 7 (ref 5–15)
BUN: 16 mg/dL (ref 6–20)
CO2: 25 mmol/L (ref 22–32)
Calcium: 9.4 mg/dL (ref 8.9–10.3)
Chloride: 107 mmol/L (ref 98–111)
Creatinine, Ser: 1.23 mg/dL (ref 0.61–1.24)
GFR, Estimated: >60 mL/min (ref >60)
Glucose, Bld: 75 mg/dL (ref 70–99)
Potassium: 3.9 mmol/L (ref 3.5–5.1)
Sodium: 139 mmol/L (ref 135–145)

## 2020-11-22 ENCOUNTER — Other Ambulatory Visit (HOSPITAL_COMMUNITY): Payer: 59

## 2020-11-28 MED FILL — CARVEDILOL 25 MG TABLET: 25 | 30 days supply | Qty: 60 | Fill #11

## 2020-11-28 MED FILL — ATORVASTATIN 40 MG TABLET: 40 | 30 days supply | Qty: 30 | Fill #4

## 2020-11-28 MED FILL — SPIRONOLACTONE 25 MG TABS: 25 | 30 days supply | Qty: 30 | Fill #4

## 2020-11-28 MED FILL — ASPIRIN LOW DOSE 81 MG TBEC: 81 | 30 days supply | Qty: 30 | Fill #3

## 2021-01-07 ENCOUNTER — Other Ambulatory Visit (HOSPITAL_COMMUNITY): Payer: Self-pay

## 2021-01-23 ENCOUNTER — Other Ambulatory Visit (HOSPITAL_COMMUNITY): Payer: Self-pay

## 2021-01-23 MED FILL — Atorvastatin Calcium Tab 40 MG (Base Equivalent): ORAL | 30 days supply | Qty: 30 | Fill #0 | Status: AC

## 2021-01-23 MED FILL — Aspirin Tab Delayed Release 81 MG: ORAL | 30 days supply | Qty: 30 | Fill #0 | Status: AC

## 2021-01-23 MED FILL — Hydralazine HCl Tab 50 MG: ORAL | 30 days supply | Qty: 135 | Fill #0 | Status: AC

## 2021-01-24 ENCOUNTER — Other Ambulatory Visit (HOSPITAL_COMMUNITY): Payer: Self-pay

## 2021-02-07 MED FILL — Isosorbide Mononitrate Tab ER 24HR 60 MG: ORAL | 30 days supply | Qty: 45 | Fill #0 | Status: AC

## 2021-02-08 ENCOUNTER — Other Ambulatory Visit (HOSPITAL_COMMUNITY): Payer: Self-pay

## 2021-02-08 MED FILL — Spironolactone Tab 25 MG: ORAL | 30 days supply | Qty: 30 | Fill #0 | Status: AC

## 2021-02-15 ENCOUNTER — Other Ambulatory Visit (HOSPITAL_COMMUNITY): Payer: Self-pay

## 2021-02-17 ENCOUNTER — Other Ambulatory Visit (HOSPITAL_COMMUNITY): Payer: Self-pay

## 2021-02-20 ENCOUNTER — Ambulatory Visit (HOSPITAL_COMMUNITY)
Admission: RE | Admit: 2021-02-20 | Discharge: 2021-02-20 | Disposition: A | Discharge status: Home / Self Care | Payer: 59 | Source: Ambulatory Visit | Attending: Cardiology | Admitting: Cardiology

## 2021-02-20 ENCOUNTER — Encounter (HOSPITAL_COMMUNITY): Payer: Self-pay | Admitting: Cardiology

## 2021-02-20 ENCOUNTER — Other Ambulatory Visit: Payer: Self-pay

## 2021-02-20 VITALS — BP 134/80 | HR 75 | Wt 174.8 lb

## 2021-02-20 DIAGNOSIS — Z79899 Other long term (current) drug therapy: Secondary | ICD-10-CM | POA: Insufficient documentation

## 2021-02-20 DIAGNOSIS — E785 Hyperlipidemia, unspecified: Secondary | ICD-10-CM | POA: Insufficient documentation

## 2021-02-20 DIAGNOSIS — D649 Anemia, unspecified: Secondary | ICD-10-CM | POA: Insufficient documentation

## 2021-02-20 DIAGNOSIS — I428 Other cardiomyopathies: Secondary | ICD-10-CM | POA: Diagnosis not present

## 2021-02-20 DIAGNOSIS — Z7982 Long term (current) use of aspirin: Secondary | ICD-10-CM | POA: Diagnosis not present

## 2021-02-20 DIAGNOSIS — I5022 Chronic systolic (congestive) heart failure: Secondary | ICD-10-CM | POA: Diagnosis not present

## 2021-02-20 DIAGNOSIS — I110 Hypertensive heart disease with heart failure: Secondary | ICD-10-CM | POA: Insufficient documentation

## 2021-02-20 DIAGNOSIS — Z8249 Family history of ischemic heart disease and other diseases of the circulatory system: Secondary | ICD-10-CM | POA: Insufficient documentation

## 2021-02-20 HISTORY — DX: Heart failure, unspecified: I50.9

## 2021-02-20 LAB — BASIC METABOLIC PANEL
Anion gap: 8 (ref 5–15)
BUN: 15 mg/dL (ref 6–20)
CO2: 25 mmol/L (ref 22–32)
Calcium: 9 mg/dL (ref 8.9–10.3)
Chloride: 108 mmol/L (ref 98–111)
Creatinine, Ser: 1.26 mg/dL — ABNORMAL HIGH (ref 0.61–1.24)
GFR, Estimated: >60 mL/min (ref >60)
Glucose, Bld: 85 mg/dL (ref 70–99)
Potassium: 3.6 mmol/L (ref 3.5–5.1)
Sodium: 141 mmol/L (ref 135–145)

## 2021-02-20 NOTE — Progress Notes (Signed)
PCP: Patient, [name] (Inactive) Primary Cardiologist: Dr Shirlee Latch   HPI: Nathaniel Carr. is a 55 y.o. male with a history of HTN, HLD, and anemia. No known prior cardiac history. He had an uncle with heart failure.   Admitted 02/21/2019 with dyspnea and cough. CTA negative for PE, COVID-19 negative. Diagnosed with CHF, diuresed with IV lasix. Echo showed newly reduced EF 20-25%, so HF team was consulted. He underwent RHC/LHC that showed preserved cardiac output, no CAD, and optimized filling pressures post-diuresis. HF meds started. Discharge weight 192.5 pounds.   Echo in 9/20 showed EF remains 25-30% with mild LV dilation.   Echo in 11/20 showed EF 40-45%, mild LVH, mildly decreased RV systolic function, mild-moderate MR.  Echo in 11/21 showed EF 55% with mild LVH, normal RV.   He returns for followup of CHF. He has been doing very well recently.  No lightheadedness.  Weight down 4 lbs.  No significant exertional dyspnea, walks for exercise.  No orthopnea/PND.  No chest pain.  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     Labs (5/20): LDL 181 Labs (7/20): K 4.3, creatinine 1.4 Labs (10/20): K 4, creatinine 1.42 => 1.84, LDL 90 Labs (1/21): K 3.9, creatinine 1.35 Labs (6/21): K 3.7, creatinine 1.59 Labs (8/21): LDL 73, K 3.9, creatinine 1.4 Labs (2/22): K 3.9, creatinine 1.23  PMH: 1. Hyperlipidemia 2. HTN 3. Chronic systolic CHF: Nonischemic cardiomyopathy.  Echo (5/20) with EF 20-25%, normal RV.  - RHC/LHC (5/20): No significant coronary disease.  Mean RA 4, PA 29/17 mean 21, mean PCWP 10, CI 2.93.  - Cardiac MRI (5/20): Moderate LV dilation with diffuse, severe hypokinesis, EF 17%. Normal RV size and systolic function, EF 46%.Moderate MR and moderate TR.  Uninterpretable delayed enhancement images.  - Echo (9/20): EF 25-30%, mild LV dilation, moderate MR, moderate LAE.  - Echo (11/20): EF 40-45%, mild LVH, mildly  decreased RV systolic function, mild-moderate MR.  - CPX (11/20): peak VO2 23.7, VE/VCO2 slope 41, RER 1.1.  Mild-moderate HF limitation, restrictive lung physiology likely due to body habitus.  - Echo (11/21): EF 55%, mild LVH, normal RV size and systolic function, PASP 32 mmHg.  4. Partial SBO: 6/21.  5. ABIs (9/21): Normal.   ROS: All systems negative except as listed in HPI, PMH and Problem List.  SH:  Social History   Socioeconomic History  . Marital status: Married    Spouse name: Not on file  . Number of children: Not on file  . Years of education: Not on file  . Highest education level: Not on file  Occupational History  . Not on file  Tobacco Use  . Smoking status: Never Smoker  . Smokeless tobacco: Never Used  . 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 Alcohol use: No  . Drug use: No  . Sexual activity: Yes  Other Topics Concern  . Not on file  Social History Narrative  . Not on file   Social Determinants of Health   Financial Resource Strain: Not on file  Food Insecurity: Not on file  Transportation Needs: Not on file  Physical Activity: Not on file  Stress: Not on file  Social Connections: Not on file  Intimate Partner Violence: Not on file    FH:  Family History  Problem Relation Age of Onset  . Hypertension Paternal Grandfather  Current Outpatient Medications  Medication Sig Dispense Refill  . aspirin (ASPIRIN LOW DOSE) 81 MG EC tablet Take 1 tablet (81 mg total) by mouth daily. Swallow whole. 30 tablet 5  . aspirin-acetaminophen-caffeine (EXCEDRIN MIGRAINE) 250-250-65 MG tablet Take 2 tablets by mouth every 6 (six) hours as needed for headache or migraine. 30 tablet 3  . atorvastatin (LIPITOR) 40 MG tablet Take 1 tablet (40 mg total) by mouth daily. 30 tablet 11  . carvedilol (COREG) 25 MG tablet TAKE 1 TABLET (25 MG TOTAL) BY MOUTH 2 (TWO) TIMES DAILY WITH A MEAL. 180 tablet  3  . Cholecalciferol (VITAMIN D-3 PO) Take 1 capsule by mouth 4 (four) times a week.     . hydrALAZINE (APRESOLINE) 50 MG tablet TAKE 1 & 1/2 TABLETS BY MOUTH 3 TIMES DAILY 135 tablet 3  . isosorbide mononitrate (IMDUR) 60 MG 24 hr tablet TAKE 1 & 1/2 TABLETS BY MOUTH DAILY 45 tablet 5  . sacubitril-valsartan (ENTRESTO) 97-103 MG Take 1 tablet by mouth 2 (two) times daily. 180 tablet 3  . spironolactone (ALDACTONE) 25 MG tablet Take 1 tablet (25 mg total) by mouth daily. 30 tablet 11  . Zinc 50 MG CAPS Take 1 capsule by mouth daily in the afternoon.     No current facility-administered medications for this encounter.    Vitals:   02/20/21 1115  BP: 134/80  Pulse: 75  SpO2: 100%  Weight: 79.3 kg (174 lb 12.8 oz)   Wt Readings from Last 3 Encounters:  02/20/21 79.3 kg (174 lb 12.8 oz)  08/22/20 80.7 kg (178 lb)  05/30/20 83.6 kg (184 lb 6.4 oz)    PHYSICAL EXAM: General: NAD Neck: No JVD, no thyromegaly or thyroid nodule.  Lungs: Clear to auscultation bilaterally with normal respiratory effort. CV: Nondisplaced PMI.  Heart regular S1/S2, no S3/S4, no murmur.  No peripheral edema.  No carotid bruit.  Normal pedal pulses.  Abdomen: Soft, nontender, no hepatosplenomegaly, no distention.  Skin: Intact without lesions or rashes.  Neurologic: Alert and oriented x 3.  Psych: Normal affect. Extremities: No clubbing or cyanosis.  HEENT: Normal.   ASSESSMENT & PLAN: 1. Chronic systolic CHF:  Nonischemic cardiomyopathy.  02/22/2019 Echo with EF 20-25%, diffuse hypokinesis, normal RV, moderate MR. Cause uncertain. Does have history of HTN. Cath showed no significant coronary disease and preserved cardiac output. Uncle has CHF, no other family members. No ETOH/drugs. TSH was ok. No definite pre-existing URI/viral symptoms. Cardiac MRI in 5/20 showed LVEF 17%, RV normal. Unfortunately, delayed enhancement images were uninterpretable.  Echo in 9/20 showed EF 25-30%, echo in 11/20 showed EF back  up to 40-45%, and echo 11/21 showed EF up to 55%.  NYHA class I-II symptoms, not volume overloaded on exam.   - Continue Coreg 25 mg bid.   - Continue Entresto 97-103 twice a day. - Continue spironolactone 25 mg daily. BMET today.  - Continue hydralazine 75 mg tid and Imdur 90 mg daily.  - Continue dapagliflozin 10 mg daily.  - Repeat echo at followup in 6 months to make sure EF remains improved.  2. HTN: BP controlled.     3. Hyperlipidemia: Good lipids in 8/21.   Followup in 6 months with echo with BMET again in 3 months.   Marca Ancona 02/20/2021

## 2021-02-20 NOTE — Patient Instructions (Signed)
Labs done today, we will call you for abnormal results  Your physician recommends that you return for lab work in: 3 months  Please call our office in October to schedule your follow up appointment and echocardiogram  If you have any questions or concerns before your next appointment please send [place] a message through [place] or call our office at [phone].    TO LEAVE A MESSAGE FOR THE NURSE SELECT OPTION 2, PLEASE LEAVE A MESSAGE INCLUDING: . YOUR NAME . DATE OF BIRTH . CALL BACK NUMBER . REASON FOR CALL**this is important as we prioritize the call backs  YOU WILL RECEIVE A CALL BACK THE SAME DAY AS LONG AS YOU CALL BEFORE 4:00 PM  At the [facility], you and your health needs are our priority. As part of our continuing mission to provide you with exceptional heart care, we have created designated Provider Care Teams. These Care Teams include your primary Cardiologist (physician) and Advanced Practice Providers (APPs- Physician Assistants and Nurse Practitioners) who all work together to provide you with the care you need, when you need it.   You may see any of the following providers on your designated Care Team at your next follow up: Marland Kitchen Dr Arvilla Meres . Dr Marca Ancona . Dr Thornell Mule . Tonye Becket, NP . Robbie Lis, PA . Shanda Bumps [place],NP . Karle Plumber, PharmD   Please be sure to bring in all your medications bottles to every appointment.

## 2021-02-28 ENCOUNTER — Telehealth (HOSPITAL_COMMUNITY): Payer: Self-pay | Admitting: Pharmacy Technician

## 2021-02-28 NOTE — Telephone Encounter (Signed)
Advanced Heart Failure Patient Advocate Encounter  Sent in [employer] application via fax. Called and left patient a voicemail regarding successful fax receipt.  Will follow up.

## 2021-03-01 ENCOUNTER — Other Ambulatory Visit (HOSPITAL_COMMUNITY): Payer: Self-pay

## 2021-03-01 MED ORDER — SACUBITRIL-VALSARTAN 97-103 MG PO TABS: 1.0000 | ORAL_TABLET | Freq: Two times a day (BID) | ORAL | 1 refills | Status: DC

## 2021-03-05 ENCOUNTER — Other Ambulatory Visit (HOSPITAL_COMMUNITY): Payer: Self-pay | Admitting: Cardiology

## 2021-03-07 ENCOUNTER — Other Ambulatory Visit (HOSPITAL_COMMUNITY): Payer: Self-pay | Admitting: Cardiology

## 2021-03-07 ENCOUNTER — Other Ambulatory Visit (HOSPITAL_COMMUNITY): Payer: Self-pay

## 2021-03-07 MED ORDER — ASPIRIN 81 MG PO TBEC
81.0000 mg | DELAYED_RELEASE_TABLET | Freq: Every day | ORAL | 5 refills | Status: DC
  Filled 2021-03-07: qty 30, 30d supply, fill #0
  Filled 2021-04-03: qty 30, 30d supply, fill #1
  Filled 2021-05-07: qty 90, 90d supply, fill #2
  Filled 2021-07-05: qty 90, 90d supply, fill #3
  Filled 2021-08-21: qty 30, 30d supply, fill #3

## 2021-03-08 ENCOUNTER — Other Ambulatory Visit (HOSPITAL_COMMUNITY): Payer: Self-pay | Admitting: Cardiology

## 2021-03-08 ENCOUNTER — Other Ambulatory Visit (HOSPITAL_COMMUNITY): Payer: Self-pay

## 2021-03-08 MED ORDER — HYDRALAZINE HCL 50 MG PO TABS
75.0000 mg | ORAL_TABLET | Freq: Three times a day (TID) | ORAL | 3 refills | Status: DC
  Filled 2021-03-08: qty 135, 30d supply, fill #0
  Filled 2021-04-17: qty 135, 30d supply, fill #1
  Filled 2021-05-30: qty 135, 30d supply, fill #2
  Filled 2021-07-09: qty 135, 30d supply, fill #3

## 2021-03-13 ENCOUNTER — Other Ambulatory Visit (HOSPITAL_COMMUNITY): Payer: Self-pay

## 2021-03-14 ENCOUNTER — Telehealth (HOSPITAL_COMMUNITY): Payer: Self-pay | Admitting: Pharmacy Technician

## 2021-03-14 NOTE — Telephone Encounter (Signed)
Advanced Heart Failure Patient Advocate Encounter   Patient was approved to receive Entresto from [employer]  Patient ID: [id number] Effective dates: 03/01/21 through 10/07/21  Entresto delivery ETA 03/14/21. Called and left the patient message.   Craig Staggers (CPhT) [facility] Patient Advocate  03/14/2021 2:52 PM

## 2021-03-21 ENCOUNTER — Other Ambulatory Visit (HOSPITAL_COMMUNITY): Payer: Self-pay

## 2021-03-21 ENCOUNTER — Other Ambulatory Visit (HOSPITAL_COMMUNITY): Payer: Self-pay | Admitting: Cardiology

## 2021-03-21 MED FILL — Isosorbide Mononitrate Tab ER 24HR 60 MG: ORAL | 30 days supply | Qty: 45 | Fill #1 | Status: CN

## 2021-03-22 ENCOUNTER — Other Ambulatory Visit (HOSPITAL_COMMUNITY): Payer: Self-pay

## 2021-03-22 ENCOUNTER — Other Ambulatory Visit (HOSPITAL_COMMUNITY): Payer: Self-pay | Admitting: Cardiology

## 2021-03-22 MED ORDER — SPIRONOLACTONE 25 MG PO TABS
25.0000 mg | ORAL_TABLET | Freq: Every day | ORAL | 4 refills | Status: DC
  Filled 2021-03-22: qty 30, 30d supply, fill #0
  Filled 2021-04-27: qty 30, 30d supply, fill #1
  Filled 2021-05-25: qty 30, 30d supply, fill #2
  Filled 2021-07-05: qty 30, 30d supply, fill #3
  Filled 2021-08-12: qty 30, 30d supply, fill #4

## 2021-03-22 MED ORDER — ATORVASTATIN CALCIUM 40 MG PO TABS
40.0000 mg | ORAL_TABLET | Freq: Every day | ORAL | 11 refills | Status: DC
  Filled 2021-03-22: qty 30, 30d supply, fill #0
  Filled 2021-05-05: qty 30, 30d supply, fill #1
  Filled 2021-06-04: qty 30, 30d supply, fill #2
  Filled 2021-07-05: qty 30, 30d supply, fill #3
  Filled 2021-08-20: qty 30, 30d supply, fill #4
  Filled 2021-09-30: qty 30, 30d supply, fill #5
  Filled 2021-11-01: qty 30, 30d supply, fill #6
  Filled 2021-12-17: qty 30, 30d supply, fill #7
  Filled 2022-02-12 (×2): qty 30, 30d supply, fill #8
  Filled 2022-02-12: qty 90, 90d supply, fill #8

## 2021-03-23 ENCOUNTER — Other Ambulatory Visit (HOSPITAL_COMMUNITY): Payer: Self-pay

## 2021-04-03 ENCOUNTER — Other Ambulatory Visit (HOSPITAL_COMMUNITY): Payer: Self-pay

## 2021-04-04 MED FILL — Isosorbide Mononitrate Tab ER 24HR 60 MG: ORAL | 30 days supply | Qty: 45 | Fill #1 | Status: AC

## 2021-04-05 ENCOUNTER — Other Ambulatory Visit (HOSPITAL_COMMUNITY): Payer: Self-pay

## 2021-04-06 ENCOUNTER — Other Ambulatory Visit (HOSPITAL_COMMUNITY): Payer: Self-pay

## 2021-04-17 ENCOUNTER — Other Ambulatory Visit (HOSPITAL_COMMUNITY): Payer: Self-pay

## 2021-04-17 MED FILL — Carvedilol Tab 25 MG: ORAL | 90 days supply | Qty: 180 | Fill #0 | Status: CN

## 2021-04-19 ENCOUNTER — Other Ambulatory Visit (HOSPITAL_COMMUNITY): Payer: Self-pay

## 2021-04-19 MED FILL — Carvedilol Tab 25 MG: ORAL | 30 days supply | Qty: 60 | Fill #0 | Status: AC

## 2021-04-27 ENCOUNTER — Other Ambulatory Visit (HOSPITAL_COMMUNITY): Payer: Self-pay

## 2021-05-05 ENCOUNTER — Other Ambulatory Visit (HOSPITAL_COMMUNITY): Payer: Self-pay

## 2021-05-07 IMAGING — CT CT ANGIOGRAPHY CHEST
1 of 8 series · 2 of 16 positions shown · IV contrast (omnipaque)
Comparison: None.

CLINICAL DATA: Cough, chest pain, shortness of breath, elevated
D-dimer.

EXAM:
CT ANGIOGRAPHY CHEST WITH CONTRAST
TECHNIQUE: Multidetector CT imaging of the chest was performed using the
standard protocol during bolus administration of intravenous
contrast. Multiplanar CT image reconstructions and MIPs were
obtained to evaluate the vascular anatomy.
CONTRAST:  100mL OMNIPAQUE IOHEXOL 350 MG/ML SOLN

[Series 6: pe thins · axial · 0.75mm/px · z∈[+369,+469]mm · 2 of 301 slices shown]
[im 101/301  lung]
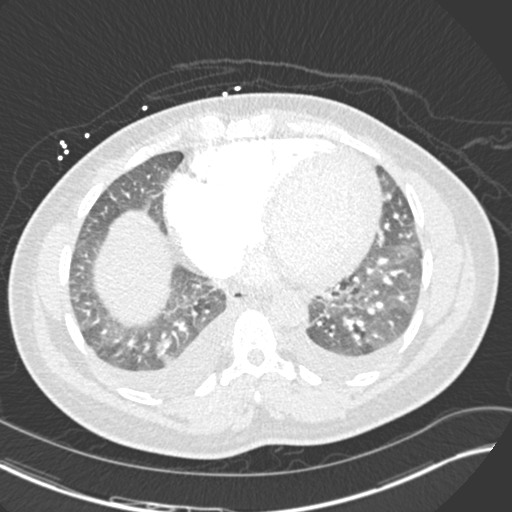
[im 201/301  soft-tissue]
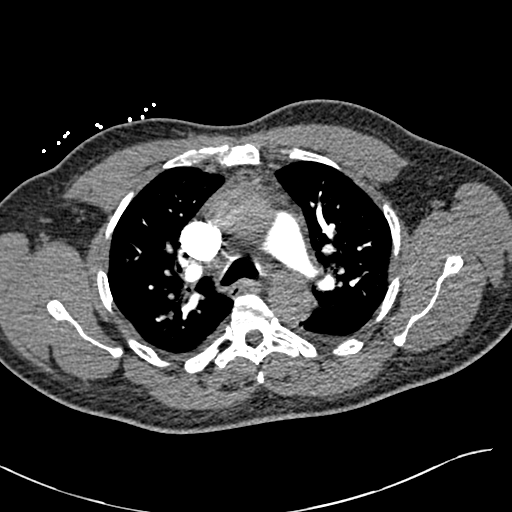

[2 of 16 positions shown; findings below may reference images not displayed]

FINDINGS: Cardiovascular: Satisfactory opacification of the bilateral
pulmonary arteries to the segmental level. No evidence of pulmonary
embolism.

No evidence of thoracic aortic aneurysm.

The heart is normal in size.  No pericardial effusion.

Mediastinum/Nodes: No suspicious mediastinal lymphadenopathy.

Visualized thyroid is unremarkable.

Lungs/Pleura: Mild paraseptal emphysematous changes at the lung
apices.

Mild mosaic attenuation/ground-glass opacities in the lungs
bilaterally, possibly reflecting mild interstitial edema, less
likely air trapping.

Small bilateral pleural effusions.

No suspicious pulmonary nodules.

No pneumothorax.

Upper Abdomen: Visualized upper abdomen is grossly unremarkable.

Musculoskeletal: Visualized osseous structures are within normal
limits.

Review of the MIP images confirms the above findings.
IMPRESSION: No evidence of pulmonary embolism.

Small bilateral pleural effusions.

Possible mild interstitial edema, less likely air trapping related
to small airways disease.

## 2021-05-08 ENCOUNTER — Other Ambulatory Visit (HOSPITAL_COMMUNITY): Payer: Self-pay

## 2021-05-15 MED FILL — Isosorbide Mononitrate Tab ER 24HR 60 MG: ORAL | 30 days supply | Qty: 45 | Fill #2 | Status: AC

## 2021-05-16 ENCOUNTER — Other Ambulatory Visit (HOSPITAL_COMMUNITY): Payer: Self-pay

## 2021-05-22 ENCOUNTER — Other Ambulatory Visit (HOSPITAL_COMMUNITY): Payer: Self-pay

## 2021-05-22 ENCOUNTER — Ambulatory Visit (HOSPITAL_COMMUNITY)
Admission: RE | Admit: 2021-05-22 | Discharge: 2021-05-22 | Disposition: A | Discharge status: Home / Self Care | Payer: 59 | Source: Ambulatory Visit | Attending: Cardiology | Admitting: Cardiology

## 2021-05-22 ENCOUNTER — Other Ambulatory Visit: Payer: Self-pay

## 2021-05-22 DIAGNOSIS — I5022 Chronic systolic (congestive) heart failure: Secondary | ICD-10-CM

## 2021-05-22 LAB — BASIC METABOLIC PANEL
Anion gap: 8 (ref 5–15)
BUN: 24 mg/dL — ABNORMAL HIGH (ref 6–20)
CO2: 24 mmol/L (ref 22–32)
Calcium: 9 mg/dL (ref 8.9–10.3)
Chloride: 108 mmol/L (ref 98–111)
Creatinine, Ser: 1.26 mg/dL — ABNORMAL HIGH (ref 0.61–1.24)
GFR, Estimated: >60 mL/min (ref >60)
Glucose, Bld: 92 mg/dL (ref 70–99)
Potassium: 3.8 mmol/L (ref 3.5–5.1)
Sodium: 140 mmol/L (ref 135–145)

## 2021-05-23 ENCOUNTER — Other Ambulatory Visit (HOSPITAL_COMMUNITY): Payer: Self-pay

## 2021-05-25 ENCOUNTER — Other Ambulatory Visit (HOSPITAL_COMMUNITY): Payer: Self-pay

## 2021-05-25 MED FILL — Carvedilol Tab 25 MG: ORAL | 30 days supply | Qty: 60 | Fill #1 | Status: AC

## 2021-05-26 ENCOUNTER — Other Ambulatory Visit (HOSPITAL_COMMUNITY): Payer: Self-pay

## 2021-05-30 ENCOUNTER — Other Ambulatory Visit (HOSPITAL_COMMUNITY): Payer: Self-pay

## 2021-06-05 ENCOUNTER — Other Ambulatory Visit (HOSPITAL_COMMUNITY): Payer: Self-pay

## 2021-07-05 ENCOUNTER — Other Ambulatory Visit (HOSPITAL_COMMUNITY): Payer: Self-pay

## 2021-07-05 MED FILL — Carvedilol Tab 25 MG: ORAL | 30 days supply | Qty: 60 | Fill #2 | Status: AC

## 2021-07-09 ENCOUNTER — Other Ambulatory Visit (HOSPITAL_COMMUNITY): Payer: Self-pay | Admitting: Cardiology

## 2021-07-10 ENCOUNTER — Other Ambulatory Visit (HOSPITAL_COMMUNITY): Payer: Self-pay

## 2021-07-10 MED ORDER — ISOSORBIDE MONONITRATE ER 60 MG PO TB24
90.0000 mg | ORAL_TABLET | Freq: Every day | ORAL | 5 refills | Status: DC
  Filled 2021-07-10: qty 45, 30d supply, fill #0
  Filled 2021-09-02: qty 45, 30d supply, fill #1
  Filled 2021-10-20: qty 45, 30d supply, fill #2
  Filled 2021-12-10: qty 45, 30d supply, fill #3
  Filled 2022-01-28: qty 45, 30d supply, fill #4

## 2021-07-11 ENCOUNTER — Other Ambulatory Visit (HOSPITAL_COMMUNITY): Payer: Self-pay

## 2021-08-12 ENCOUNTER — Other Ambulatory Visit (HOSPITAL_COMMUNITY): Payer: Self-pay | Admitting: Cardiology

## 2021-08-12 MED FILL — Carvedilol Tab 25 MG: ORAL | 30 days supply | Qty: 60 | Fill #3 | Status: AC

## 2021-08-14 ENCOUNTER — Other Ambulatory Visit (HOSPITAL_COMMUNITY): Payer: Self-pay

## 2021-08-14 MED ORDER — HYDRALAZINE HCL 50 MG PO TABS
75.0000 mg | ORAL_TABLET | Freq: Three times a day (TID) | ORAL | 2 refills | Status: DC
Start: 2021-08-14 — End: 2021-11-27
  Filled 2021-08-14: qty 135, 30d supply, fill #0
  Filled 2021-09-11: qty 135, 30d supply, fill #1
  Filled 2021-10-23: qty 135, 30d supply, fill #2

## 2021-08-21 ENCOUNTER — Other Ambulatory Visit (HOSPITAL_COMMUNITY): Payer: Self-pay

## 2021-08-21 ENCOUNTER — Telehealth (HOSPITAL_COMMUNITY): Payer: Self-pay | Admitting: Pharmacy Technician

## 2021-08-21 NOTE — Telephone Encounter (Signed)
Advanced Heart Failure Patient Advocate Encounter  Spoke to patient regarding re-enrollment of Entresto assistance with [employer]. Patient has renewal application and will mail it to the office.   Will fax in once all signatures are obtained.  Archer Asa, CPhT

## 2021-09-02 ENCOUNTER — Other Ambulatory Visit (HOSPITAL_COMMUNITY): Payer: Self-pay | Admitting: Cardiology

## 2021-09-04 ENCOUNTER — Other Ambulatory Visit (HOSPITAL_COMMUNITY): Payer: Self-pay

## 2021-09-04 MED ORDER — SPIRONOLACTONE 25 MG PO TABS
25.0000 mg | ORAL_TABLET | Freq: Every day | ORAL | 4 refills | Status: DC
  Filled 2021-09-04 – 2021-09-08 (×2): qty 30, 30d supply, fill #0
  Filled 2021-09-30: qty 30, 30d supply, fill #1
  Filled 2021-11-14: qty 30, 30d supply, fill #2
  Filled 2021-12-10: qty 30, 30d supply, fill #3
  Filled 2022-01-11: qty 30, 30d supply, fill #4

## 2021-09-07 ENCOUNTER — Other Ambulatory Visit (HOSPITAL_COMMUNITY): Payer: Self-pay | Admitting: *Deleted

## 2021-09-07 MED ORDER — SACUBITRIL-VALSARTAN 97-103 MG PO TABS: 1.0000 | ORAL_TABLET | Freq: Two times a day (BID) | ORAL | 3 refills | Status: DC

## 2021-09-11 ENCOUNTER — Other Ambulatory Visit (HOSPITAL_COMMUNITY): Payer: Self-pay

## 2021-09-13 ENCOUNTER — Other Ambulatory Visit (HOSPITAL_COMMUNITY): Payer: Self-pay | Admitting: Cardiology

## 2021-09-14 ENCOUNTER — Other Ambulatory Visit (HOSPITAL_COMMUNITY): Payer: Self-pay

## 2021-09-14 MED ORDER — CARVEDILOL 25 MG PO TABS
25.0000 mg | ORAL_TABLET | Freq: Two times a day (BID) | ORAL | 3 refills | Status: DC
  Filled 2021-09-14 – 2021-09-19 (×2): qty 60, 30d supply, fill #0
  Filled 2021-10-20: qty 60, 30d supply, fill #1
  Filled 2021-11-27: qty 60, 30d supply, fill #2
  Filled 2022-01-09: qty 180, 90d supply, fill #3
  Filled 2022-02-12 – 2022-05-18 (×2): qty 180, 90d supply, fill #4

## 2021-09-19 ENCOUNTER — Other Ambulatory Visit (HOSPITAL_COMMUNITY): Payer: Self-pay

## 2021-09-25 ENCOUNTER — Other Ambulatory Visit (HOSPITAL_COMMUNITY): Payer: Self-pay | Admitting: *Deleted

## 2021-09-25 MED ORDER — SACUBITRIL-VALSARTAN 97-103 MG PO TABS: 1.0000 | ORAL_TABLET | Freq: Two times a day (BID) | ORAL | 3 refills | Status: DC

## 2021-09-26 NOTE — Telephone Encounter (Signed)
Advanced Heart Failure Patient Advocate Encounter  Spoke with patient today. Stated that he sent his portion to [employer]. Faxed over RX and provider's portion.

## 2021-09-29 ENCOUNTER — Other Ambulatory Visit (HOSPITAL_COMMUNITY): Payer: Self-pay

## 2021-09-29 ENCOUNTER — Other Ambulatory Visit (HOSPITAL_COMMUNITY): Payer: Self-pay | Admitting: Cardiology

## 2021-09-29 MED ORDER — ASPIRIN 81 MG PO TBEC
81.0000 mg | DELAYED_RELEASE_TABLET | Freq: Every day | ORAL | 5 refills | Status: DC
  Filled 2021-09-29: qty 30, 30d supply, fill #0
  Filled 2021-11-01: qty 30, 30d supply, fill #1
  Filled 2021-11-19 – 2021-11-20 (×2): qty 30, 30d supply, fill #2
  Filled 2022-01-01: qty 30, 30d supply, fill #3
  Filled 2022-04-29: qty 90, 90d supply, fill #3

## 2021-10-02 ENCOUNTER — Other Ambulatory Visit (HOSPITAL_COMMUNITY): Payer: Self-pay

## 2021-10-03 ENCOUNTER — Other Ambulatory Visit (HOSPITAL_COMMUNITY): Payer: Self-pay

## 2021-10-20 ENCOUNTER — Other Ambulatory Visit (HOSPITAL_COMMUNITY): Payer: Self-pay

## 2021-10-23 ENCOUNTER — Other Ambulatory Visit (HOSPITAL_COMMUNITY): Payer: Self-pay

## 2021-11-02 ENCOUNTER — Other Ambulatory Visit (HOSPITAL_COMMUNITY): Payer: Self-pay

## 2021-11-14 ENCOUNTER — Other Ambulatory Visit (HOSPITAL_COMMUNITY): Payer: Self-pay

## 2021-11-15 ENCOUNTER — Other Ambulatory Visit (HOSPITAL_COMMUNITY): Payer: Self-pay

## 2021-11-20 ENCOUNTER — Other Ambulatory Visit (HOSPITAL_COMMUNITY): Payer: Self-pay

## 2021-11-27 ENCOUNTER — Other Ambulatory Visit (HOSPITAL_COMMUNITY): Payer: Self-pay | Admitting: Cardiology

## 2021-11-27 ENCOUNTER — Other Ambulatory Visit (HOSPITAL_COMMUNITY): Payer: Self-pay

## 2021-11-28 ENCOUNTER — Other Ambulatory Visit (HOSPITAL_COMMUNITY): Payer: Self-pay

## 2021-11-28 MED ORDER — HYDRALAZINE HCL 50 MG PO TABS
75.0000 mg | ORAL_TABLET | Freq: Three times a day (TID) | ORAL | 0 refills | Status: DC
  Filled 2021-11-28: qty 135, 30d supply, fill #0

## 2021-11-29 ENCOUNTER — Other Ambulatory Visit (HOSPITAL_COMMUNITY): Payer: Self-pay

## 2021-12-11 ENCOUNTER — Other Ambulatory Visit (HOSPITAL_COMMUNITY): Payer: Self-pay

## 2021-12-18 ENCOUNTER — Other Ambulatory Visit (HOSPITAL_COMMUNITY): Payer: Self-pay

## 2021-12-21 NOTE — Telephone Encounter (Signed)
Advanced Heart Failure Patient Advocate Encounter  ? ?Patient was approved to receive Entresto from [employer] ? ?Effective dates: 12/14/21 through 10/07/22 ? ?Document scanned to chart. Called and left the patient a message.  ? ?Archer Asa, CPhT ? ? ? ?

## 2022-01-01 ENCOUNTER — Other Ambulatory Visit (HOSPITAL_COMMUNITY): Payer: Self-pay | Admitting: Cardiology

## 2022-01-01 ENCOUNTER — Other Ambulatory Visit (HOSPITAL_COMMUNITY): Payer: Self-pay | Admitting: Pharmacist

## 2022-01-01 ENCOUNTER — Other Ambulatory Visit (HOSPITAL_COMMUNITY): Payer: Self-pay

## 2022-01-01 ENCOUNTER — Other Ambulatory Visit (HOSPITAL_COMMUNITY): Payer: Self-pay | Admitting: *Deleted

## 2022-01-01 MED ORDER — HYDRALAZINE HCL 50 MG PO TABS
75.0000 mg | ORAL_TABLET | Freq: Three times a day (TID) | ORAL | 1 refills | Status: DC
Start: 2022-01-01 — End: 2022-08-21
  Filled 2022-01-01: qty 405, 90d supply, fill #0
  Filled 2022-04-29: qty 405, 90d supply, fill #1

## 2022-01-01 MED ORDER — HYDRALAZINE HCL 50 MG PO TABS
75.0000 mg | ORAL_TABLET | Freq: Three times a day (TID) | ORAL | 0 refills | Status: DC
  Filled 2022-01-01 (×2): qty 90, 20d supply, fill #0

## 2022-01-01 MED ORDER — HYDRALAZINE HCL 50 MG PO TABS
75.0000 mg | ORAL_TABLET | Freq: Three times a day (TID) | ORAL | 3 refills | Status: DC
  Filled 2022-01-01 (×2): qty 135, 30d supply, fill #0

## 2022-01-09 ENCOUNTER — Other Ambulatory Visit (HOSPITAL_COMMUNITY): Payer: Self-pay

## 2022-01-12 ENCOUNTER — Other Ambulatory Visit: Payer: Self-pay

## 2022-01-12 ENCOUNTER — Other Ambulatory Visit (HOSPITAL_COMMUNITY): Payer: Self-pay | Admitting: Cardiology

## 2022-01-12 ENCOUNTER — Encounter (HOSPITAL_BASED_OUTPATIENT_CLINIC_OR_DEPARTMENT_OTHER): Payer: Self-pay

## 2022-01-12 ENCOUNTER — Other Ambulatory Visit (HOSPITAL_COMMUNITY): Payer: Self-pay

## 2022-01-12 ENCOUNTER — Emergency Department (HOSPITAL_BASED_OUTPATIENT_CLINIC_OR_DEPARTMENT_OTHER)
Admission: EM | Admit: 2022-01-12 | Discharge: 2022-01-12 | Disposition: A | Discharge status: Home / Self Care | Payer: Managed Care, Other (non HMO) | Attending: Emergency Medicine | Admitting: Emergency Medicine

## 2022-01-12 DIAGNOSIS — Z7982 Long term (current) use of aspirin: Secondary | ICD-10-CM | POA: Insufficient documentation

## 2022-01-12 DIAGNOSIS — H6121 Impacted cerumen, right ear: Secondary | ICD-10-CM | POA: Diagnosis not present

## 2022-01-12 DIAGNOSIS — H938X1 Other specified disorders of right ear: Secondary | ICD-10-CM | POA: Diagnosis present

## 2022-01-12 MED ORDER — SPIRONOLACTONE 25 MG PO TABS
25.0000 mg | ORAL_TABLET | Freq: Every day | ORAL | 0 refills | Status: DC
  Filled 2022-01-12: qty 65, 65d supply, fill #0
  Filled 2022-01-12: qty 25, 25d supply, fill #0

## 2022-01-12 NOTE — ED Triage Notes (Signed)
Patient complains of right ear fullness x 7 days - states he feels there is something blocking it. ?

## 2022-01-12 NOTE — ED Provider Notes (Signed)
?[facility]PT ?Provider Note ? ? ?CSN: [id number] ?Arrival date & time: 01/12/22  1027 ? ?  ? ?History ? ?Chief Complaint  ?Patient presents with  ? Ear Fullness  ? ? ?Roark Rufo. is a 56 y.o. male. ? ?56 yo male with right ear feeling clogged for the past few days, intermittent, thought it had cleared then returned after a few seconds. No recent illness or allergy symptoms. No drainage. No recent Q-tip use. Woke up with ear clogged.  ? ? ?  ? ?Home Medications ?Prior to Admission medications   ?Medication Sig Start Date End Date Taking? Authorizing Provider  ?aspirin (ASPIRIN LOW DOSE) 81 MG EC tablet Take 1 tablet (81 mg total) by mouth daily. Swallow whole. 08/22/20   Laurey Morale, MD  ?aspirin (ASPIRIN LOW DOSE) 81 MG EC tablet Take 1 tablet (81 mg total) by mouth daily. 09/29/21   Laurey Morale, MD  ?aspirin-acetaminophen-caffeine [facility] MIGRAINE) [phone] MG tablet Take 2 tablets by mouth every 6 (six) hours as needed for headache or migraine. 08/22/20   Laurey Morale, MD  ?atorvastatin (LIPITOR) 40 MG tablet Take 1 tablet (40 mg total) by mouth daily. 08/22/20   Laurey Morale, MD  ?atorvastatin (LIPITOR) 40 MG tablet Take 1 tablet (40 mg total) by mouth daily. 03/22/21   Laurey Morale, MD  ?carvedilol (COREG) 25 MG tablet Take 1 tablet (25 mg total) by mouth 2 (two) times daily with a meal. 09/14/21   Laurey Morale, MD  ?Cholecalciferol (VITAMIN D-3 PO) Take 1 capsule by mouth 4 (four) times a week.     [provider]  ?hydrALAZINE (APRESOLINE) 50 MG tablet Take 1.5 tablets (75 mg total) by mouth 3 (three) times daily. 01/01/22   Laurey Morale, MD  ?isosorbide mononitrate (IMDUR) 60 MG 24 hr tablet Take 1.5 tablets (90 mg total) by mouth daily. 07/10/21   Bensimhon, Bevelyn Buckles, MD  ?sacubitril-valsartan (ENTRESTO) 97-103 MG Take 1 tablet by mouth 2 (two) times daily. 09/25/21   Laurey Morale, MD  ?spironolactone (ALDACTONE) 25 MG tablet Take  1 tablet (25 mg total) by mouth daily. 08/22/20   Laurey Morale, MD  ?spironolactone (ALDACTONE) 25 MG tablet Take 1 tablet (25 mg total) by mouth daily. LAST REFILL NEEDS APPOINTMENT FOR FURTHER REFILLS 01/12/22   Laurey Morale, MD  ?Zinc 50 MG CAPS Take 1 capsule by mouth daily in the afternoon.    [provider]  ?   ? ?Allergies    ?Shellfish-derived products, Codeine, Grass extracts [gramineae pollens], and Penicillins   ? ?Review of Systems   ?Review of Systems ?Negative except as per HPI ?Physical Exam ?Updated Vital Signs ?BP (!) 139/101 (BP Location: Left Arm)   Pulse 79   Temp 98.1 ?F (36.7 ?C) (Oral)   Resp 16   Ht 5\' 8"  (1.727 m)   Wt 77.1 kg   SpO2 100%   BMI 25.85 kg/m?  ?Physical Exam ?Vitals and nursing note reviewed.  ?Constitutional:   ?   General: He is not in acute distress. ?   Appearance: He is well-developed. He is not diaphoretic.  ?HENT:  ?   Head: Normocephalic and atraumatic.  ?   Right Ear: There is impacted cerumen.  ?   Left Ear: Tympanic membrane and ear canal normal.  ?   Nose: Nose normal.  ?Eyes:  ?   Conjunctiva/sclera: Conjunctivae normal.  ?Pulmonary:  ?   Effort: Pulmonary  effort is normal.  ?Musculoskeletal:  ?   Cervical back: Neck supple.  ?Lymphadenopathy:  ?   Cervical: No cervical adenopathy.  ?Skin: ?   General: Skin is warm and dry.  ?   Findings: No erythema or rash.  ?Neurological:  ?   Mental Status: He is alert and oriented to person, place, and time.  ?Psychiatric:     ?   Behavior: Behavior normal.  ? ? ?ED Results / Procedures / Treatments   ?Labs ?(all labs ordered are listed, but only abnormal results are displayed) ?Labs Reviewed - No data to display ? ?EKG ?None ? ?Radiology ?No results found. ? ?Procedures ?Procedures  ? ? ?Medications Ordered in ED ?Medications - No data to display ? ?ED Course/ Medical Decision Making/ A&P ?  ?                        ?Medical Decision Making ? ?56 yo male presents with clogged right ear, recurrent  problem, feels like he needs his ear irrigated. Found to have impacted cerumen. Ear was irrigated successfully. TM intact. Referred to ENT for PRN follow up.  ? ? ? ? ? ? ? ?Final Clinical Impression(s) / ED Diagnoses ?Final diagnoses:  ?Impacted cerumen of right ear  ? ? ?Rx / DC Orders ?ED Discharge Orders   ? ? None  ? ?  ? ? ?  ?Jeannie Fend, PA-C ?01/12/22 1325 ? ?  ?Rolan Bucco, MD ?01/12/22 1456 ? ?

## 2022-01-29 ENCOUNTER — Other Ambulatory Visit (HOSPITAL_COMMUNITY): Payer: Self-pay | Admitting: Internal Medicine

## 2022-01-29 ENCOUNTER — Other Ambulatory Visit (HOSPITAL_COMMUNITY): Payer: Self-pay

## 2022-01-29 MED ORDER — ISOSORBIDE MONONITRATE ER 60 MG PO TB24
90.0000 mg | ORAL_TABLET | Freq: Every day | ORAL | 3 refills | Status: DC
  Filled 2022-01-29: qty 135, 90d supply, fill #0
  Filled 2022-07-02 – 2022-07-10 (×2): qty 135, 90d supply, fill #1
  Filled 2023-01-01 – 2023-01-21 (×2): qty 135, 90d supply, fill #2

## 2022-02-12 ENCOUNTER — Other Ambulatory Visit (HOSPITAL_COMMUNITY): Payer: Self-pay

## 2022-02-12 ENCOUNTER — Other Ambulatory Visit (HOSPITAL_COMMUNITY): Payer: Self-pay | Admitting: *Deleted

## 2022-02-12 MED ORDER — ATORVASTATIN CALCIUM 40 MG PO TABS
40.0000 mg | ORAL_TABLET | Freq: Every day | ORAL | 3 refills | Status: DC
  Filled 2022-02-12: qty 90, 90d supply, fill #0
  Filled 2022-06-25: qty 90, 90d supply, fill #1
  Filled 2022-11-22: qty 90, 90d supply, fill #2

## 2022-02-13 ENCOUNTER — Other Ambulatory Visit (HOSPITAL_COMMUNITY): Payer: Self-pay

## 2022-02-19 ENCOUNTER — Other Ambulatory Visit (HOSPITAL_COMMUNITY): Payer: Self-pay

## 2022-02-20 ENCOUNTER — Other Ambulatory Visit (HOSPITAL_COMMUNITY): Payer: Self-pay

## 2022-02-21 ENCOUNTER — Other Ambulatory Visit (HOSPITAL_COMMUNITY): Payer: Self-pay

## 2022-02-26 ENCOUNTER — Other Ambulatory Visit (HOSPITAL_COMMUNITY): Payer: Self-pay

## 2022-04-15 ENCOUNTER — Emergency Department (HOSPITAL_BASED_OUTPATIENT_CLINIC_OR_DEPARTMENT_OTHER)
Admission: EM | Admit: 2022-04-15 | Discharge: 2022-04-15 | Disposition: A | Discharge status: Home / Self Care | Payer: Commercial Managed Care - HMO | Attending: Emergency Medicine | Admitting: Emergency Medicine

## 2022-04-15 ENCOUNTER — Other Ambulatory Visit: Payer: Self-pay

## 2022-04-15 ENCOUNTER — Encounter (HOSPITAL_BASED_OUTPATIENT_CLINIC_OR_DEPARTMENT_OTHER): Payer: Self-pay

## 2022-04-15 DIAGNOSIS — H6123 Impacted cerumen, bilateral: Secondary | ICD-10-CM | POA: Insufficient documentation

## 2022-04-15 DIAGNOSIS — Z7982 Long term (current) use of aspirin: Secondary | ICD-10-CM | POA: Diagnosis not present

## 2022-04-15 DIAGNOSIS — H9201 Otalgia, right ear: Secondary | ICD-10-CM | POA: Diagnosis present

## 2022-04-15 NOTE — ED Provider Notes (Signed)
[facility] EMERG[facility] DEPT Provider Note   CSN: [id number] Arrival date & time: 04/15/22  1920     History {Add pertinent medical, surgical, social history, OB history to HPI:1} Chief Complaint  Patient presents with   Otalgia    Nathaniel Carr. is a 56 y.o. male.   Otalgia  Patient is a 56 year old male presented emergency room today with complaints of ear fullness in the right ear.  Denies pain.  Has had issues with cerumen impactions in the past.  Feels similar      Home Medications Prior to Admission medications   Medication Sig Start Date End Date Taking? Authorizing Provider  aspirin (ASPIRIN LOW DOSE) 81 MG EC tablet Take 1 tablet (81 mg total) by mouth daily. Swallow whole. 08/22/20   Laurey Morale, MD  aspirin (ASPIRIN LOW DOSE) 81 MG EC tablet Take 1 tablet (81 mg total) by mouth daily. 09/29/21   Laurey Morale, MD  aspirin-acetaminophen-caffeine [facility] MIGRAINE) [phone] MG tablet Take 2 tablets by mouth every 6 (six) hours as needed for headache or migraine. 08/22/20   Laurey Morale, MD  atorvastatin (LIPITOR) 40 MG tablet Take 1 tablet (40 mg total) by mouth daily. 02/12/22   Laurey Morale, MD  carvedilol (COREG) 25 MG tablet Take 1 tablet (25 mg total) by mouth 2 (two) times daily with a meal. 09/14/21   Laurey Morale, MD  Cholecalciferol (VITAMIN D-3 PO) Take 1 capsule by mouth 4 (four) times a week.     [provider]  hydrALAZINE (APRESOLINE) 50 MG tablet Take 1.5 tablets (75 mg total) by mouth 3 (three) times daily. 01/01/22   Laurey Morale, MD  isosorbide mononitrate (IMDUR) 60 MG 24 hr tablet Take 1.5 tablets (90 mg total) by mouth daily. 01/29/22   Bensimhon, Bevelyn Buckles, MD  sacubitril-valsartan (ENTRESTO) 97-103 MG Take 1 tablet by mouth 2 (two) times daily. 09/25/21   Laurey Morale, MD  spironolactone (ALDACTONE) 25 MG tablet Take 1 tablet (25 mg total) by mouth daily. 08/22/20   Laurey Morale, MD   spironolactone (ALDACTONE) 25 MG tablet Take 1 tablet (25 mg total) by mouth daily. LAST REFILL NEEDS APPOINTMENT FOR FURTHER REFILLS 01/12/22   Laurey Morale, MD  Zinc 50 MG CAPS Take 1 capsule by mouth daily in the afternoon.    [provider]      Allergies    Shellfish-derived products, Codeine, Grass extracts [gramineae pollens], and Penicillins    Review of Systems   Review of Systems  HENT:  Positive for ear pain.     Physical Exam Updated Vital Signs BP (!) 142/108 (BP Location: Right Arm)   Pulse 80   Temp 98 F (36.7 C)   Resp 17   Ht 5\' 8"  (1.727 m)   Wt 77.1 kg   SpO2 98%   BMI 25.84 kg/m  Physical Exam Vitals and nursing note reviewed.  Constitutional:      General: He is not in acute distress.    Appearance: Normal appearance. He is not ill-appearing.  HENT:     Head: Normocephalic and atraumatic.     Ears:     Comments: Bilateral EAC with cerumen impaction Eyes:     General: No scleral icterus.       Right eye: No discharge.        Left eye: No discharge.     Conjunctiva/sclera: Conjunctivae normal.  Pulmonary:     Effort: Pulmonary effort  is normal.     Breath sounds: No stridor.  Neurological:     Mental Status: He is alert and oriented to person, place, and time. Mental status is at baseline.     ED Results / Procedures / Treatments   Labs (all labs ordered are listed, but only abnormal results are displayed) Labs Reviewed - No data to display  EKG None  Radiology No results found.  Procedures Procedures  {Document cardiac monitor, telemetry assessment procedure when appropriate:1}  Medications Ordered in ED Medications - No data to display  ED Course/ Medical Decision Making/ A&P                           Medical Decision Making  Patient is a 56 year old male presented emergency room today with complaints of ear fullness in the right ear.  Denies pain.  Has had issues with cerumen impactions in the past.  Feels  similar   EACs are impacted with cerumen   RNs were able to irrigate R EAC   Final Clinical Impression(s) / ED Diagnoses Final diagnoses:  Bilateral impacted cerumen    Rx / DC Orders ED Discharge Orders     None

## 2022-04-15 NOTE — Discharge Instructions (Signed)
Please follow-up with an ENT provider.  I have given you the information for an office that he can follow-up with.

## 2022-04-15 NOTE — ED Triage Notes (Signed)
Patient here POV from Home.  Endorses Right Sided Otalgia that began 5 days ago approximately.   States it began when patient was in [employer]. No N/V/D. No Fevers.   NAD Noted during Triage. A&Ox4. GCS 15. Ambulatory.

## 2022-04-29 ENCOUNTER — Other Ambulatory Visit (HOSPITAL_COMMUNITY): Payer: Self-pay | Admitting: Cardiology

## 2022-04-30 ENCOUNTER — Other Ambulatory Visit (HOSPITAL_COMMUNITY): Payer: Self-pay

## 2022-05-01 ENCOUNTER — Other Ambulatory Visit (HOSPITAL_COMMUNITY): Payer: Self-pay

## 2022-05-01 MED ORDER — SPIRONOLACTONE 25 MG PO TABS
25.0000 mg | ORAL_TABLET | Freq: Every day | ORAL | 0 refills | Status: DC
  Filled 2022-05-01: qty 30, 30d supply, fill #0

## 2022-05-18 ENCOUNTER — Other Ambulatory Visit (HOSPITAL_COMMUNITY): Payer: Self-pay

## 2022-05-25 ENCOUNTER — Other Ambulatory Visit (HOSPITAL_COMMUNITY): Payer: Self-pay

## 2022-06-16 ENCOUNTER — Other Ambulatory Visit (HOSPITAL_COMMUNITY): Payer: Self-pay | Admitting: Cardiology

## 2022-06-18 ENCOUNTER — Other Ambulatory Visit (HOSPITAL_COMMUNITY): Payer: Self-pay

## 2022-06-18 MED ORDER — SPIRONOLACTONE 25 MG PO TABS
25.0000 mg | ORAL_TABLET | Freq: Every day | ORAL | 0 refills | Status: DC
  Filled 2022-06-18: qty 30, 30d supply, fill #0

## 2022-06-25 ENCOUNTER — Other Ambulatory Visit (HOSPITAL_COMMUNITY): Payer: Self-pay

## 2022-07-02 ENCOUNTER — Other Ambulatory Visit (HOSPITAL_COMMUNITY): Payer: Self-pay

## 2022-07-05 ENCOUNTER — Other Ambulatory Visit (HOSPITAL_COMMUNITY): Payer: Self-pay

## 2022-07-10 ENCOUNTER — Other Ambulatory Visit (HOSPITAL_COMMUNITY): Payer: Self-pay

## 2022-07-16 ENCOUNTER — Other Ambulatory Visit (HOSPITAL_COMMUNITY): Payer: Self-pay

## 2022-07-16 MED ORDER — SACUBITRIL-VALSARTAN 97-103 MG PO TABS: 1.0000 | ORAL_TABLET | Freq: Two times a day (BID) | ORAL | 0 refills | Status: DC

## 2022-07-26 ENCOUNTER — Other Ambulatory Visit (HOSPITAL_COMMUNITY): Payer: Self-pay | Admitting: Cardiology

## 2022-07-26 ENCOUNTER — Other Ambulatory Visit (HOSPITAL_COMMUNITY): Payer: Self-pay

## 2022-07-26 MED ORDER — SPIRONOLACTONE 25 MG PO TABS
25.0000 mg | ORAL_TABLET | Freq: Every day | ORAL | 0 refills | Status: DC
  Filled 2022-07-26: qty 30, 30d supply, fill #0

## 2022-08-07 ENCOUNTER — Other Ambulatory Visit (HOSPITAL_COMMUNITY): Payer: Self-pay | Admitting: Cardiology

## 2022-08-07 ENCOUNTER — Other Ambulatory Visit (HOSPITAL_COMMUNITY): Payer: Self-pay

## 2022-08-07 MED ORDER — ASPIRIN 81 MG PO TBEC
81.0000 mg | DELAYED_RELEASE_TABLET | Freq: Every day | ORAL | 0 refills | Status: DC
  Filled 2022-08-07: qty 30, 30d supply, fill #0

## 2022-08-21 ENCOUNTER — Other Ambulatory Visit (HOSPITAL_COMMUNITY): Payer: Self-pay | Admitting: Cardiology

## 2022-08-22 ENCOUNTER — Other Ambulatory Visit (HOSPITAL_COMMUNITY): Payer: Self-pay

## 2022-08-22 MED ORDER — HYDRALAZINE HCL 50 MG PO TABS
75.0000 mg | ORAL_TABLET | Freq: Three times a day (TID) | ORAL | 0 refills | Status: DC
  Filled 2022-08-22: qty 405, 90d supply, fill #0

## 2022-08-27 ENCOUNTER — Other Ambulatory Visit (HOSPITAL_COMMUNITY): Payer: Self-pay

## 2022-08-29 ENCOUNTER — Other Ambulatory Visit (HOSPITAL_COMMUNITY): Payer: Self-pay | Admitting: Cardiology

## 2022-08-29 ENCOUNTER — Other Ambulatory Visit (HOSPITAL_COMMUNITY): Payer: Self-pay

## 2022-08-29 MED ORDER — ASPIRIN 81 MG PO TBEC
81.0000 mg | DELAYED_RELEASE_TABLET | Freq: Every day | ORAL | 0 refills | Status: AC
Start: 2022-08-29 — End: ?
  Filled 2022-08-29: qty 30, 30d supply, fill #0

## 2022-09-05 ENCOUNTER — Other Ambulatory Visit (HOSPITAL_COMMUNITY): Payer: Self-pay

## 2022-09-05 ENCOUNTER — Telehealth (HOSPITAL_COMMUNITY): Payer: Self-pay

## 2022-09-05 NOTE — Telephone Encounter (Signed)
Advanced Heart Failure Patient Advocate Encounter  Received renewal notification for [employer]). Attempted to call patient to discuss, no answer, no voicemail.  Will try back.  Burnell Blanks, CPhT Rx Patient Advocate Phone: [phone]

## 2022-09-06 ENCOUNTER — Other Ambulatory Visit (HOSPITAL_COMMUNITY): Payer: Self-pay

## 2022-09-06 ENCOUNTER — Other Ambulatory Visit (HOSPITAL_COMMUNITY): Payer: Self-pay | Admitting: Cardiology

## 2022-09-06 MED ORDER — SACUBITRIL-VALSARTAN 97-103 MG PO TABS
1.0000 | ORAL_TABLET | Freq: Two times a day (BID) | ORAL | 0 refills | Status: DC
  Filled 2022-09-06: qty 60, 30d supply, fill #0

## 2022-09-06 NOTE — Telephone Encounter (Signed)
Advanced Heart Failure Patient Advocate Encounter  Spoke to patient on the phone. Patient is amenable to using copay card for savings.  Added copay card information into [place], sent new rx to [facility] pharmacy.  Patient is aware that he can contact us once he has his income statements for this year and we can submit an application to [employer] if desired.  Nothing further needed at this time.  Burnell Blanks, CPhT Rx Patient Advocate Phone: [phone]

## 2022-09-07 ENCOUNTER — Other Ambulatory Visit (HOSPITAL_COMMUNITY): Payer: Self-pay

## 2022-09-07 MED ORDER — SPIRONOLACTONE 25 MG PO TABS
25.0000 mg | ORAL_TABLET | Freq: Every day | ORAL | 0 refills | Status: DC
  Filled 2022-09-07: qty 15, 15d supply, fill #0

## 2022-09-10 ENCOUNTER — Other Ambulatory Visit (HOSPITAL_COMMUNITY): Payer: Self-pay

## 2022-09-13 ENCOUNTER — Other Ambulatory Visit (HOSPITAL_COMMUNITY): Payer: Self-pay

## 2022-09-14 ENCOUNTER — Other Ambulatory Visit (HOSPITAL_COMMUNITY): Payer: Self-pay

## 2022-09-14 ENCOUNTER — Other Ambulatory Visit (HOSPITAL_COMMUNITY): Payer: Self-pay | Admitting: *Deleted

## 2022-09-14 MED ORDER — SPIRONOLACTONE 25 MG PO TABS
25.0000 mg | ORAL_TABLET | Freq: Every day | ORAL | 0 refills | Status: DC
  Filled 2022-09-14 (×2): qty 30, 30d supply, fill #0

## 2022-09-17 ENCOUNTER — Other Ambulatory Visit (HOSPITAL_COMMUNITY): Payer: Self-pay

## 2022-09-17 NOTE — Telephone Encounter (Signed)
Advanced Heart Failure Patient Advocate Encounter  Patient left message with [facility] requesting MD forms be sent to [employer] despite my previous conversation with the patient. Called patient back. I am sending MD/RX form to [employer] at patient request, and he will provide POI to [employer] directly, or leave POI at [facility] to be faxed.  MD form faxed to [employer] on 09/17/22.

## 2022-09-19 ENCOUNTER — Other Ambulatory Visit (HOSPITAL_COMMUNITY): Payer: Self-pay

## 2022-09-19 ENCOUNTER — Other Ambulatory Visit (HOSPITAL_COMMUNITY): Payer: Self-pay | Admitting: Cardiology

## 2022-09-19 MED ORDER — CARVEDILOL 25 MG PO TABS
25.0000 mg | ORAL_TABLET | Freq: Two times a day (BID) | ORAL | 0 refills | Status: DC
  Filled 2022-09-19: qty 60, 30d supply, fill #0

## 2022-09-20 ENCOUNTER — Other Ambulatory Visit (HOSPITAL_COMMUNITY): Payer: Self-pay

## 2022-09-24 ENCOUNTER — Other Ambulatory Visit (HOSPITAL_COMMUNITY): Payer: Self-pay | Admitting: *Deleted

## 2022-09-24 MED ORDER — SACUBITRIL-VALSARTAN 97-103 MG PO TABS: 1.0000 | ORAL_TABLET | Freq: Two times a day (BID) | ORAL | 0 refills | Status: DC

## 2022-09-24 NOTE — Telephone Encounter (Signed)
Advanced Heart Failure Patient Advocate Encounter  Faxed POI, Copy of insurance card, and pt signed authorization form on 09/24/22.

## 2022-09-28 NOTE — Telephone Encounter (Signed)
Advanced Heart Failure Patient Advocate Encounter  Patient was approved to receive Entresto from [employer] Effective 09/27/22 to 10/08/23 Determination letter has been added to patient chart.  Burnell Blanks, CPhT Rx Patient Advocate Phone: [phone]

## 2022-10-05 ENCOUNTER — Other Ambulatory Visit (HOSPITAL_COMMUNITY): Payer: Self-pay

## 2022-10-19 ENCOUNTER — Other Ambulatory Visit (HOSPITAL_COMMUNITY): Payer: Self-pay

## 2022-10-19 MED ORDER — SACUBITRIL-VALSARTAN 97-103 MG PO TABS: 1.0000 | ORAL_TABLET | Freq: Two times a day (BID) | ORAL | 3 refills | Status: DC

## 2022-10-22 ENCOUNTER — Ambulatory Visit (HOSPITAL_COMMUNITY)
Admission: RE | Admit: 2022-10-22 | Discharge: 2022-10-22 | Disposition: A | Discharge status: Home / Self Care | Payer: Commercial Managed Care - HMO | Source: Ambulatory Visit | Attending: Cardiology | Admitting: Cardiology

## 2022-10-22 ENCOUNTER — Encounter (HOSPITAL_COMMUNITY): Payer: Self-pay | Admitting: Cardiology

## 2022-10-22 VITALS — BP 130/80 | HR 71 | Wt 176.2 lb

## 2022-10-22 DIAGNOSIS — I110 Hypertensive heart disease with heart failure: Secondary | ICD-10-CM | POA: Insufficient documentation

## 2022-10-22 DIAGNOSIS — I5022 Chronic systolic (congestive) heart failure: Secondary | ICD-10-CM | POA: Diagnosis not present

## 2022-10-22 DIAGNOSIS — I428 Other cardiomyopathies: Secondary | ICD-10-CM | POA: Insufficient documentation

## 2022-10-22 DIAGNOSIS — Z8249 Family history of ischemic heart disease and other diseases of the circulatory system: Secondary | ICD-10-CM | POA: Insufficient documentation

## 2022-10-22 DIAGNOSIS — Z79899 Other long term (current) drug therapy: Secondary | ICD-10-CM | POA: Insufficient documentation

## 2022-10-22 DIAGNOSIS — E785 Hyperlipidemia, unspecified: Secondary | ICD-10-CM | POA: Insufficient documentation

## 2022-10-22 LAB — LIPID PANEL
Cholesterol: 150 mg/dL (ref 0–200)
HDL: 69 mg/dL (ref >40)
LDL Cholesterol: 72 mg/dL (ref 0–99)
Total CHOL/HDL Ratio: 2.2 RATIO
Triglycerides: 43 mg/dL (ref <150)
VLDL: 9 mg/dL (ref 0–40)

## 2022-10-22 LAB — BASIC METABOLIC PANEL
Anion gap: 6 (ref 5–15)
BUN: 11 mg/dL (ref 6–20)
CO2: 27 mmol/L (ref 22–32)
Calcium: 9.1 mg/dL (ref 8.9–10.3)
Chloride: 107 mmol/L (ref 98–111)
Creatinine, Ser: 1.06 mg/dL (ref 0.61–1.24)
GFR, Estimated: >60 mL/min (ref >60)
Glucose, Bld: 89 mg/dL (ref 70–99)
Potassium: 3.8 mmol/L (ref 3.5–5.1)
Sodium: 140 mmol/L (ref 135–145)

## 2022-10-22 MED ORDER — SACUBITRIL-VALSARTAN 97-103 MG PO TABS: 1.0000 | ORAL_TABLET | Freq: Two times a day (BID) | ORAL | 3 refills | Status: DC

## 2022-10-22 NOTE — Patient Instructions (Signed)
There has been no changes to your medication.  Labs done today, your results will be available in MyChart, we will contact you for abnormal readings.  Repeat blood work in 3 months.  Your physician has requested that you have an echocardiogram. Echocardiography is a painless test that uses sound waves to create images of your heart. It provides your doctor with information about the size and shape of your heart and how well your heart's chambers and valves are working. This procedure takes approximately one hour. There are no restrictions for this procedure. Please do NOT wear cologne, perfume, aftershave, or lotions (deodorant is allowed). Please arrive 15 minutes prior to your appointment time.  Your physician recommends that you schedule a follow-up appointment in: 1 year ( January 2025)  ** please call the office in November to arrange your follow up appointment. **  If you have any questions or concerns before your next appointment please send [place] a message through [place] or call our office at [phone].    TO LEAVE A MESSAGE FOR THE NURSE SELECT OPTION 2, PLEASE LEAVE A MESSAGE INCLUDING: YOUR NAME DATE OF BIRTH CALL BACK NUMBER REASON FOR CALL**this is important as we prioritize the call backs  YOU WILL RECEIVE A CALL BACK THE SAME DAY AS LONG AS YOU CALL BEFORE 4:00 PM  At the [facility], you and your health needs are our priority. As part of our continuing mission to provide you with exceptional heart care, we have created designated Provider Care Teams. These Care Teams include your primary Cardiologist (physician) and Advanced Practice Providers (APPs- Physician Assistants and Nurse Practitioners) who all work together to provide you with the care you need, when you need it.   You may see any of the following providers on your designated Care Team at your next follow up: Dr Glori Bickers Dr Loralie Champagne Dr. Roxana Hires, NP Lyda Jester, [place] [facility] [place], [place] Forestine Na, NP Audry Riles, PharmD   Please be sure to bring in all your medications bottles to every appointment.

## 2022-10-23 NOTE — Progress Notes (Signed)
PCP: Pcp, No Primary Cardiologist: Dr Aundra Dubin   HPI: Nathaniel Carr. is a 57 y.o. male with a history of HTN, HLD, and anemia. No known prior cardiac history. He had an uncle with heart failure.    Admitted 02/21/2019 with dyspnea and cough. CTA negative for PE, COVID-19 negative. Diagnosed with CHF, diuresed with IV lasix.  Echo showed newly reduced EF 20-25%, so HF team was consulted. He underwent RHC/LHC that showed preserved cardiac output, no CAD, and optimized filling pressures post-diuresis. HF meds started. Discharge weight 192.5 pounds.   Echo in 9/20 showed EF remains 25-30% with mild LV dilation.   Echo in 11/20 showed EF 40-45%, mild LVH, mildly decreased RV systolic function, mild-moderate MR.  Echo in 11/21 showed EF 55% with mild LVH, normal RV.   He returns for followup of CHF. 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  Overall doing quite well.  No exertional chest pain or dyspnea.  He has been off [place].  He does get chronic "migraine" headaches, not clearly related to Imdur use.  Weight is stable.  No orthopnea/PND.  No lightheadedness.  He walks for exercise.     Labs (5/20): LDL 181 Labs (7/20): K 4.3, creatinine 1.4 Labs (10/20): K 4, creatinine 1.42 => 1.84, LDL 90 Labs (1/21): K 3.9, creatinine 1.35 Labs (6/21): K 3.7, creatinine 1.59 Labs (8/21): LDL 73, K 3.9, creatinine 1.4 Labs (2/22): K 3.9, creatinine 1.23 Labs (8/22): creatinine 1.26  ECG (personally reviewed): NSR, LVH with repolarization abnormality.   PMH: 1. Hyperlipidemia 2. HTN 3. Chronic systolic CHF: Nonischemic cardiomyopathy.  Echo (5/20) with EF 20-25%, normal RV.  - RHC/LHC (5/20): No significant coronary disease.  Mean RA 4, PA 29/17 mean 21, mean PCWP 10, CI 2.93.  - Cardiac MRI (5/20): Moderate LV dilation with diffuse, severe hypokinesis, EF 17%.  Normal RV size and systolic function, EF 25%. Moderate MR and moderate TR.  Uninterpretable delayed enhancement images.  - Echo  (9/20): EF 25-30%, mild LV dilation, moderate MR, moderate LAE.  - Echo (11/20): EF 40-45%, mild LVH, mildly decreased RV systolic function, mild-moderate MR.  - CPX (11/20): peak VO2 23.7, VE/VCO2 slope 41, RER 1.1.  Mild-moderate HF limitation, restrictive lung physiology likely due to body habitus.  - Echo (11/21): EF 55%, mild LVH, normal RV size and systolic function, PASP 32 mmHg.  4. Partial SBO: 6/21.  5. ABIs (9/21): Normal.   ROS: All systems negative except as listed in HPI, PMH and Problem List.  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  Substance and Sexual Activity   Alcohol use: No   Drug use: No   Sexual activity: Yes  Other Topics Concern   Not on file  Social History Narrative   Not on file   Social Determinants of Health   Financial Resource Strain: Not on file  Food Insecurity: Not on file  Transportation Needs: Not on file  Physical Activity: Not on file  Stress: Not on file  Social Connections: Not on file  Intimate  Partner Violence: Not on file    FH:  Family History  Problem Relation Age of Onset   Hypertension Paternal Grandfather      Current Outpatient Medications  Medication Sig Dispense Refill   aspirin EC (ASPIRIN LOW DOSE) 81 MG tablet Take 1 tablet (81 mg total) by mouth daily. 30 tablet 0   aspirin-acetaminophen-caffeine (EXCEDRIN MIGRAINE) 250-250-65 MG tablet Take 2 tablets by mouth every 6 (six) hours as needed for headache or migraine. 30 tablet 3   atorvastatin (LIPITOR) 40 MG tablet Take 1 tablet (40 mg total) by mouth daily. 90 tablet 3   carvedilol (COREG) 25 MG tablet Take 1  tablet (25 mg total) by mouth 2 (two) times daily with a meal. 60 tablet 0   hydrALAZINE (APRESOLINE) 50 MG tablet Take 1.5 tablets (75 mg total) by mouth 3 (three) times daily. 405 tablet 0   isosorbide mononitrate (IMDUR) 60 MG 24 hr tablet Take 1.5 tablets (90 mg total) by mouth daily. 135 tablet 3   Multiple Vitamins-Minerals (CENTRUM ADULT PO) Take 1 tablet by mouth daily.     spironolactone (ALDACTONE) 25 MG tablet Take 1 tablet (25 mg total) by mouth daily. NEED FOLLOW UP APPOINTMENT FOR ANYMORE REFILLS 30 tablet 0   sacubitril-valsartan (ENTRESTO) 97-103 MG Take 1 tablet by mouth 2 (two) times daily. 180 tablet 3   No current facility-administered medications for this encounter.    Vitals:   10/22/22 1510  BP: 130/80  Pulse: 71  SpO2: 98%  Weight: 79.9 kg (176 lb 3.2 oz)   Wt Readings from Last 3 Encounters:  10/22/22 79.9 kg (176 lb 3.2 oz)  04/15/22 77.1 kg (169 lb 15.6 oz)  01/12/22 77.1 kg (170 lb)    PHYSICAL EXAM: General: NAD Neck: No JVD, no thyromegaly or thyroid nodule.  Lungs: Clear to auscultation bilaterally with normal respiratory effort. CV: Nondisplaced PMI.  Heart regular S1/S2, no S3/S4, no murmur.  No peripheral edema.  No carotid bruit.  Normal pedal pulses.  Abdomen: Soft, nontender, no hepatosplenomegaly, no distention.  Skin: Intact without lesions or rashes.  Neurologic: Alert and oriented x 3.  Psych: Normal affect. Extremities: No clubbing or cyanosis.  HEENT: Normal.   ASSESSMENT & PLAN: 1. Chronic systolic CHF:  Nonischemic cardiomyopathy.  02/22/2019 Echo with EF 20-25%, diffuse hypokinesis, normal RV, moderate MR. Cause uncertain.  Does have history of HTN. Cath showed no significant coronary disease and preserved cardiac output. Uncle has CHF, no other family members.  No ETOH/drugs. TSH was ok. No definite pre-existing URI/viral symptoms.  Cardiac MRI in 5/20 showed LVEF 17%, RV normal. Unfortunately, delayed enhancement images were  uninterpretable.  Echo in 9/20 showed EF 25-30%, echo in 11/20 showed EF back up to 40-45%, and echo 11/21 showed EF up to 55%.  NYHA class I symptoms, not volume overloaded on exam.  - Continue Coreg 25 mg bid.   - Continue Entresto 97-103 twice a day. - Continue spironolactone 25 mg daily. BMET today.  - Continue hydralazine 75 mg tid.  With headaches, I will decrease Imdur to 60 mg daily to see if this helps any.  If not, can increase back to 90 mg daily.  - He can stay off dapagliflozin unless echo shows that EF has declined.   - I will obtain echo to make sure EF remains improved.   2. HTN: BP controlled.     3. Hyperlipidemia: Check lipids today.   Followup in 1 year if echo stable.  He  does need to get BMET q3 months with his current meds.   Marca Ancona 10/23/2022

## 2022-11-06 ENCOUNTER — Other Ambulatory Visit (HOSPITAL_COMMUNITY): Payer: Self-pay | Admitting: Cardiology

## 2022-11-07 ENCOUNTER — Other Ambulatory Visit (HOSPITAL_COMMUNITY): Payer: Self-pay

## 2022-11-07 MED ORDER — SPIRONOLACTONE 25 MG PO TABS
25.0000 mg | ORAL_TABLET | Freq: Every day | ORAL | 0 refills | Status: DC
  Filled 2022-11-07 – 2022-11-22 (×2): qty 30, 30d supply, fill #0

## 2022-11-12 ENCOUNTER — Other Ambulatory Visit (HOSPITAL_COMMUNITY): Payer: Self-pay

## 2022-11-20 ENCOUNTER — Other Ambulatory Visit (HOSPITAL_COMMUNITY): Payer: Self-pay

## 2022-11-22 ENCOUNTER — Other Ambulatory Visit (HOSPITAL_COMMUNITY): Payer: Self-pay | Admitting: Cardiology

## 2022-11-22 ENCOUNTER — Other Ambulatory Visit (HOSPITAL_COMMUNITY): Payer: Self-pay

## 2022-11-22 ENCOUNTER — Other Ambulatory Visit: Payer: Self-pay

## 2022-11-22 MED ORDER — CARVEDILOL 25 MG PO TABS
25.0000 mg | ORAL_TABLET | Freq: Two times a day (BID) | ORAL | 0 refills | Status: DC
  Filled 2022-11-22: qty 60, 30d supply, fill #0

## 2022-11-23 ENCOUNTER — Other Ambulatory Visit: Payer: Self-pay

## 2022-11-23 ENCOUNTER — Emergency Department (HOSPITAL_BASED_OUTPATIENT_CLINIC_OR_DEPARTMENT_OTHER)
Admission: EM | Admit: 2022-11-23 | Discharge: 2022-11-23 | Disposition: A | Discharge status: Home / Self Care | Payer: Commercial Managed Care - HMO | Attending: Emergency Medicine | Admitting: Emergency Medicine

## 2022-11-23 DIAGNOSIS — Z7982 Long term (current) use of aspirin: Secondary | ICD-10-CM | POA: Insufficient documentation

## 2022-11-23 DIAGNOSIS — H6123 Impacted cerumen, bilateral: Secondary | ICD-10-CM | POA: Insufficient documentation

## 2022-11-23 DIAGNOSIS — I509 Heart failure, unspecified: Secondary | ICD-10-CM | POA: Insufficient documentation

## 2022-11-23 DIAGNOSIS — H9201 Otalgia, right ear: Secondary | ICD-10-CM | POA: Diagnosis present

## 2022-11-23 NOTE — ED Notes (Signed)
Updated patient on wait time

## 2022-11-23 NOTE — ED Provider Notes (Signed)
[facility] Provider Note   CSN: [id number] Arrival date & time: 11/23/22  1823     History  Chief Complaint  Patient presents with   Ear Fullness    Nathaniel Carr. is a 57 y.o. male.  Patient presents to the emergency department today for evaluation of right ear pressure and decreased hearing.  This has been ongoing for about 3 days.  Denies trauma.  Denies other URI symptoms.  This has happened to him in the past when he has had a buildup of earwax.  He has a history of nonischemic cardiomyopathy and CHF.       Home Medications Prior to Admission medications   Medication Sig Start Date End Date Taking? Authorizing Provider  aspirin EC (ASPIRIN LOW DOSE) 81 MG tablet Take 1 tablet (81 mg total) by mouth daily. 08/29/22   Larey Dresser, MD  aspirin-acetaminophen-caffeine (EXCEDRIN MIGRAINE) [phone] MG tablet Take 2 tablets by mouth every 6 (six) hours as needed for headache or migraine. 08/22/20   Larey Dresser, MD  atorvastatin (LIPITOR) 40 MG tablet Take 1 tablet (40 mg total) by mouth daily. 02/12/22   Larey Dresser, MD  carvedilol (COREG) 25 MG tablet Take 1 tablet (25 mg total) by mouth 2 (two) times daily with a meal. 11/22/22   Larey Dresser, MD  hydrALAZINE (APRESOLINE) 50 MG tablet Take 1.5 tablets (75 mg total) by mouth 3 (three) times daily. 08/22/22   Larey Dresser, MD  isosorbide mononitrate (IMDUR) 60 MG 24 hr tablet Take 1.5 tablets (90 mg total) by mouth daily. 01/29/22   Bensimhon, Shaune Pascal, MD  Multiple Vitamins-Minerals (CENTRUM ADULT PO) Take 1 tablet by mouth daily.    [provider]  sacubitril-valsartan (ENTRESTO) 97-103 MG Take 1 tablet by mouth 2 (two) times daily. 10/22/22   Larey Dresser, MD  spironolactone (ALDACTONE) 25 MG tablet Take 1 tablet (25 mg total) by mouth daily. NEED FOLLOW UP APPOINTMENT FOR ANYMORE REFILLS 11/07/22   Larey Dresser, MD      Allergies     Shellfish-derived products, Codeine, Grass extracts [gramineae pollens], and Penicillins    Review of Systems   Review of Systems  Physical Exam Updated Vital Signs BP (!) 161/115 (BP Location: Right Arm)   Pulse 76   Temp 98.2 F (36.8 C) (Oral)   Resp 18   Ht 5' 8"$  (1.727 m)   Wt 78.5 kg   SpO2 99%   BMI 26.30 kg/m  Physical Exam Vitals and nursing note reviewed.  Constitutional:      Appearance: He is well-developed.  HENT:     Head: Normocephalic and atraumatic.     Jaw: No trismus.     Right Ear: External ear normal. There is impacted cerumen.     Left Ear: External ear normal. There is impacted cerumen.     Nose: Nose normal. No mucosal edema or rhinorrhea.     Mouth/Throat:     Mouth: Mucous membranes are not dry.     Pharynx: Uvula midline. No oropharyngeal exudate, posterior oropharyngeal erythema or uvula swelling.     Tonsils: No tonsillar abscesses.  Eyes:     General:        Right eye: No discharge.        Left eye: No discharge.     Conjunctiva/sclera: Conjunctivae normal.  Cardiovascular:     Rate and Rhythm: Normal rate and regular rhythm.  Heart sounds: Normal heart sounds.  Pulmonary:     Effort: Pulmonary effort is normal. No respiratory distress.     Breath sounds: Normal breath sounds. No wheezing or rales.  Abdominal:     Palpations: Abdomen is soft.     Tenderness: There is no abdominal tenderness.  Musculoskeletal:     Cervical back: Normal range of motion and neck supple.  Skin:    General: Skin is warm and dry.  Neurological:     Mental Status: He is alert.     ED Results / Procedures / Treatments   Labs (all labs ordered are listed, but only abnormal results are displayed) Labs Reviewed - No data to display  EKG None  Radiology No results found.  Procedures Procedures    Medications Ordered in ED Medications - No data to display  ED Course/ Medical Decision Making/ A&P    Patient seen and examined. History  obtained directly from patient.   Labs/EKG: None ordered  Imaging: None ordered  Medications/Fluids: None ordered  Most recent vital signs reviewed and are as follows: BP (!) 161/115 (BP Location: Right Arm)   Pulse 76   Temp 98.2 F (36.8 C) (Oral)   Resp 18   Ht 5' 8"$  (1.727 m)   Wt 78.5 kg   SpO2 99%   BMI 26.30 kg/m   Initial impression: Suspect bilateral cerumen impaction.  Requested irrigation.   Signout at shift change.  Patient will require recheck of the TMs.  If symptoms persist, would recommend ENT follow-up.  Consider water:peroxide for any residual impaction.                               Medical Decision Making  Patient with decreased hearing likely due to cerumen impaction.  No signs of URI.  No signs of head trauma.           Final Clinical Impression(s) / ED Diagnoses Final diagnoses:  Bilateral impacted cerumen    Rx / DC Orders ED Discharge Orders     None         Carlisle Cater, PA-C 11/23/22 1856    Dorie Rank, MD 11/25/22 [phone]

## 2022-11-23 NOTE — ED Triage Notes (Signed)
Onset of two three days of  right ear cogged states thinks its ear wax.  Difficulty hearing out of ear.

## 2022-11-23 NOTE — ED Provider Notes (Signed)
  Physical Exam  BP (!) 161/115 (BP Location: Right Arm)   Pulse 76   Temp 98.2 F (36.8 C) (Oral)   Resp 18   Ht 5' 8"$  (1.727 m)   Wt 78.5 kg   SpO2 99%   BMI 26.30 kg/m   Physical Exam Constitutional:      Appearance: Normal appearance.  HENT:     Ears:     Comments: No mastoid pain or swelling bilaterally. No pain on manipulation of the pinna. He has some dry wax with macerated skin present in the bilateral ear canals. I am only able to visualize parts of the TM. From what I can see, it is clear with light reflex.  Eyes:     General: No scleral icterus. Pulmonary:     Effort: Pulmonary effort is normal. No respiratory distress.  Skin:    General: Skin is dry.     Findings: No rash.  Neurological:     General: No focal deficit present.     Mental Status: He is alert. Mental status is at baseline.  Psychiatric:        Mood and Affect: Mood normal.     Procedures  Procedures  ED Course / MDM    Medical Decision Making  Accepted handoff at shift change from [facility], PA-C. Please see prior provider note for more detail.   Briefly: Patient is 57 y.o. M presenting with muffled hearing    DDX: concern for Bilateral cerumen impactions  Plan: Irrigated canals for TM impaction and re-evaluate.   The patient still has dried wax with some macerated skin in the bilateral ears that are pushed up on the TM. I do not feel comfortable moving this wax/skin because of it's proximity to the TM. The patient reports that he does feel better and his hearing has improved. From what small parts I am able to see of the TM, it is clear with light reflex. He reports that his symptoms have improved and he does not have any signs of mastoiditis or pain with manipulation of the pinna. He reports he is seeing an ENT about this problem in a month and already has an appointment. The patient has been here twice before for this similar presentation. We discussed strict return precautions and red  flag symptoms. The patient verbalizes his understanding and agrees to the plan. The patient is stable and being discharged home in good condition.   I discussed this case with my attending physician who cosigned this note including patient's presenting symptoms, physical exam, and planned diagnostics and interventions. Attending physician stated agreement with plan or made changes to plan which were implemented.   Attending physician assessed patient at bedside.     Sherrell Puller, PA-C 11/23/22 2225    Dorie Rank, MD 11/25/22 [phone]

## 2022-11-23 NOTE — Discharge Instructions (Signed)
Please make sure to follow up with your ENT.   Contact a health care provider if: You have ear pain. You develop a fever. You have pus or other fluid coming from your ear. You have hearing loss. You have ringing in your ears that does not go away. You feel like the room is spinning (vertigo). Your symptoms do not improve with treatment. Get help right away if: You have bleeding from the affected ear. You have severe ear pain.

## 2022-11-23 NOTE — ED Triage Notes (Signed)
Noted ear wax in right ear.

## 2022-11-30 ENCOUNTER — Other Ambulatory Visit (HOSPITAL_COMMUNITY): Payer: Self-pay

## 2022-12-07 DIAGNOSIS — Z419 Encounter for procedure for purposes other than remedying health state, unspecified: Secondary | ICD-10-CM | POA: Diagnosis not present

## 2022-12-18 ENCOUNTER — Other Ambulatory Visit: Payer: Self-pay

## 2022-12-25 ENCOUNTER — Ambulatory Visit (HOSPITAL_COMMUNITY)
Admission: RE | Admit: 2022-12-25 | Discharge: 2022-12-25 | Disposition: A | Discharge status: Home / Self Care | Payer: Commercial Managed Care - HMO | Source: Ambulatory Visit | Attending: Cardiology | Admitting: Cardiology

## 2022-12-25 DIAGNOSIS — I5022 Chronic systolic (congestive) heart failure: Secondary | ICD-10-CM | POA: Diagnosis not present

## 2022-12-25 DIAGNOSIS — E785 Hyperlipidemia, unspecified: Secondary | ICD-10-CM | POA: Insufficient documentation

## 2022-12-25 DIAGNOSIS — I110 Hypertensive heart disease with heart failure: Secondary | ICD-10-CM | POA: Insufficient documentation

## 2022-12-25 LAB — ECHOCARDIOGRAM COMPLETE
Area-P 1/2: 2.49 cm2
S' Lateral: 4.3 cm

## 2023-01-01 ENCOUNTER — Other Ambulatory Visit (HOSPITAL_COMMUNITY): Payer: Self-pay | Admitting: Cardiology

## 2023-01-01 ENCOUNTER — Other Ambulatory Visit (HOSPITAL_COMMUNITY): Payer: Self-pay

## 2023-01-01 MED ORDER — SPIRONOLACTONE 25 MG PO TABS
25.0000 mg | ORAL_TABLET | Freq: Every day | ORAL | 0 refills | Status: DC
  Filled 2023-01-01 – 2023-01-21 (×2): qty 30, 30d supply, fill #0

## 2023-01-01 MED ORDER — HYDRALAZINE HCL 50 MG PO TABS
75.0000 mg | ORAL_TABLET | Freq: Three times a day (TID) | ORAL | 0 refills | Status: DC
  Filled 2023-01-01 – 2023-01-21 (×2): qty 405, 90d supply, fill #0

## 2023-01-07 ENCOUNTER — Other Ambulatory Visit (HOSPITAL_COMMUNITY): Payer: Self-pay

## 2023-01-07 DIAGNOSIS — Z419 Encounter for procedure for purposes other than remedying health state, unspecified: Secondary | ICD-10-CM | POA: Diagnosis not present

## 2023-01-14 ENCOUNTER — Other Ambulatory Visit (HOSPITAL_COMMUNITY): Payer: Self-pay

## 2023-01-18 ENCOUNTER — Other Ambulatory Visit (HOSPITAL_COMMUNITY): Payer: Self-pay

## 2023-01-21 ENCOUNTER — Ambulatory Visit (HOSPITAL_COMMUNITY)
Admission: RE | Admit: 2023-01-21 | Discharge: 2023-01-21 | Disposition: A | Discharge status: Home / Self Care | Payer: Commercial Managed Care - HMO | Source: Ambulatory Visit | Attending: Internal Medicine | Admitting: Internal Medicine

## 2023-01-21 ENCOUNTER — Other Ambulatory Visit (HOSPITAL_COMMUNITY): Payer: Self-pay

## 2023-01-21 DIAGNOSIS — I5022 Chronic systolic (congestive) heart failure: Secondary | ICD-10-CM | POA: Insufficient documentation

## 2023-01-21 LAB — BASIC METABOLIC PANEL
Anion gap: 6 (ref 5–15)
BUN: 11 mg/dL (ref 6–20)
CO2: 25 mmol/L (ref 22–32)
Calcium: 8.9 mg/dL (ref 8.9–10.3)
Chloride: 108 mmol/L (ref 98–111)
Creatinine, Ser: 1.16 mg/dL (ref 0.61–1.24)
GFR, Estimated: >60 mL/min (ref >60)
Glucose, Bld: 83 mg/dL (ref 70–99)
Potassium: 3.4 mmol/L — ABNORMAL LOW (ref 3.5–5.1)
Sodium: 139 mmol/L (ref 135–145)

## 2023-01-22 ENCOUNTER — Other Ambulatory Visit: Payer: Self-pay

## 2023-01-22 ENCOUNTER — Other Ambulatory Visit (HOSPITAL_COMMUNITY): Payer: Self-pay

## 2023-01-22 MED ORDER — SPIRONOLACTONE 25 MG PO TABS
25.0000 mg | ORAL_TABLET | Freq: Every day | ORAL | 11 refills | Status: DC
  Filled 2023-01-22 – 2023-02-15 (×3): qty 30, 30d supply, fill #0

## 2023-01-22 NOTE — Telephone Encounter (Signed)
Patient's medication spiro refill has been sent to his pharmacy. Per Pt's request

## 2023-01-31 ENCOUNTER — Other Ambulatory Visit (HOSPITAL_COMMUNITY): Payer: Self-pay | Admitting: Cardiology

## 2023-01-31 ENCOUNTER — Other Ambulatory Visit (HOSPITAL_COMMUNITY): Payer: Self-pay

## 2023-01-31 MED ORDER — CARVEDILOL 25 MG PO TABS
25.0000 mg | ORAL_TABLET | Freq: Two times a day (BID) | ORAL | 6 refills | Status: DC
  Filled 2023-01-31 – 2023-02-14 (×2): qty 60, 30d supply, fill #0

## 2023-02-06 DIAGNOSIS — Z419 Encounter for procedure for purposes other than remedying health state, unspecified: Secondary | ICD-10-CM | POA: Diagnosis not present

## 2023-02-11 ENCOUNTER — Other Ambulatory Visit (HOSPITAL_COMMUNITY): Payer: Self-pay

## 2023-02-14 ENCOUNTER — Other Ambulatory Visit: Payer: Self-pay

## 2023-02-14 ENCOUNTER — Other Ambulatory Visit (HOSPITAL_COMMUNITY): Payer: Self-pay

## 2023-02-15 ENCOUNTER — Other Ambulatory Visit: Payer: Self-pay

## 2023-02-15 ENCOUNTER — Other Ambulatory Visit (HOSPITAL_COMMUNITY): Payer: Self-pay

## 2023-02-26 ENCOUNTER — Other Ambulatory Visit (HOSPITAL_COMMUNITY): Payer: Self-pay

## 2023-03-09 DIAGNOSIS — Z419 Encounter for procedure for purposes other than remedying health state, unspecified: Secondary | ICD-10-CM | POA: Diagnosis not present

## 2023-04-02 ENCOUNTER — Other Ambulatory Visit (HOSPITAL_COMMUNITY): Payer: Self-pay | Admitting: Cardiology

## 2023-04-02 MED ORDER — CARVEDILOL 25 MG PO TABS: 25.0000 mg | ORAL_TABLET | Freq: Two times a day (BID) | ORAL | 3 refills | Status: DC

## 2023-04-02 MED ORDER — ISOSORBIDE MONONITRATE ER 60 MG PO TB24: 90.0000 mg | ORAL_TABLET | Freq: Every day | ORAL | 3 refills | Status: DC

## 2023-04-02 MED ORDER — ATORVASTATIN CALCIUM 40 MG PO TABS: 40.0000 mg | ORAL_TABLET | Freq: Every day | ORAL | 3 refills | Status: DC

## 2023-04-02 MED ORDER — SPIRONOLACTONE 25 MG PO TABS: 25.0000 mg | ORAL_TABLET | Freq: Every day | ORAL | 3 refills | Status: DC

## 2023-04-02 MED ORDER — HYDRALAZINE HCL 50 MG PO TABS: 75.0000 mg | ORAL_TABLET | Freq: Three times a day (TID) | ORAL | 3 refills | Status: DC

## 2023-04-02 NOTE — Telephone Encounter (Signed)
With insurance changes Request meds to new pharmacy  Scripts sent

## 2023-04-08 DIAGNOSIS — Z419 Encounter for procedure for purposes other than remedying health state, unspecified: Secondary | ICD-10-CM | POA: Diagnosis not present

## 2023-05-09 DIAGNOSIS — Z419 Encounter for procedure for purposes other than remedying health state, unspecified: Secondary | ICD-10-CM | POA: Diagnosis not present

## 2023-06-09 DIAGNOSIS — Z419 Encounter for procedure for purposes other than remedying health state, unspecified: Secondary | ICD-10-CM | POA: Diagnosis not present

## 2023-07-09 DIAGNOSIS — Z419 Encounter for procedure for purposes other than remedying health state, unspecified: Secondary | ICD-10-CM | POA: Diagnosis not present

## 2023-08-09 DIAGNOSIS — Z419 Encounter for procedure for purposes other than remedying health state, unspecified: Secondary | ICD-10-CM | POA: Diagnosis not present

## 2023-09-08 DIAGNOSIS — Z419 Encounter for procedure for purposes other than remedying health state, unspecified: Secondary | ICD-10-CM | POA: Diagnosis not present

## 2023-10-09 DIAGNOSIS — Z419 Encounter for procedure for purposes other than remedying health state, unspecified: Secondary | ICD-10-CM | POA: Diagnosis not present

## 2023-11-09 DIAGNOSIS — Z419 Encounter for procedure for purposes other than remedying health state, unspecified: Secondary | ICD-10-CM | POA: Diagnosis not present

## 2023-12-07 DIAGNOSIS — Z419 Encounter for procedure for purposes other than remedying health state, unspecified: Secondary | ICD-10-CM | POA: Diagnosis not present

## 2023-12-30 ENCOUNTER — Other Ambulatory Visit (HOSPITAL_COMMUNITY): Payer: Self-pay | Admitting: Cardiology

## 2023-12-30 MED ORDER — SACUBITRIL-VALSARTAN 97-103 MG PO TABS: 1.0000 | ORAL_TABLET | Freq: Two times a day (BID) | ORAL | 3 refills | Status: DC

## 2023-12-31 NOTE — Telephone Encounter (Signed)
 Patient returned call to request samples while patient assistance application is in review -advised of limited supply -advised pt is on higher dose and we do not carry samples at this dose  Will provide samples while application is in process. Message to pharmacy team to assist with provider portion of application  Medication Samples have been provided to the patient.  Drug name: entresto       Strength: 49/51        Qty: 28  LOT: [id number]  Exp.Date: 07/2025  Dosing instructions: tale TWO tabs twice a day  The patient has been instructed regarding the correct time, dose, and frequency of taking this medication, including desired effects and most common side effects.   Magda Bernheim M 3:12 PM 12/31/2023

## 2024-01-01 ENCOUNTER — Other Ambulatory Visit (HOSPITAL_COMMUNITY): Payer: Self-pay

## 2024-01-01 ENCOUNTER — Telehealth (HOSPITAL_COMMUNITY): Payer: Self-pay

## 2024-01-01 NOTE — Telephone Encounter (Signed)
 [facility] Patient Advocate Encounter  Application for Entresto faxed to [employer] on 01/01/2024. Application form attached to patient chart.  Burnell Blanks, CPhT Rx Patient Advocate Phone: [phone]

## 2024-01-06 NOTE — Telephone Encounter (Signed)
 [employer] is requesting proof of income before making a determination for this patient. Attempted to contact patient by phone, no answer, no voicemail set up. Will reach out again

## 2024-01-16 NOTE — Telephone Encounter (Signed)
 Second attempt to reach patient regarding proof of income. No answer, no voicemail for primary number. Left message with secondary number on file.

## 2024-01-18 DIAGNOSIS — Z419 Encounter for procedure for purposes other than remedying health state, unspecified: Secondary | ICD-10-CM | POA: Diagnosis not present

## 2024-01-22 NOTE — Telephone Encounter (Signed)
 Third attempt to reach patient regarding proof of income for [employer]. Of note, [employer] is now only accepting 1040 or attestation letter as proof of income. Pt will need to provide first 2 pages of tax return before a determination can be made.  No answer, no voicemail set up. Will be available in the future for assistance as needed.

## 2024-01-31 ENCOUNTER — Other Ambulatory Visit (HOSPITAL_COMMUNITY): Payer: Self-pay | Admitting: Cardiology

## 2024-01-31 MED ORDER — SACUBITRIL-VALSARTAN 97-103 MG PO TABS: 1.0000 | ORAL_TABLET | Freq: Two times a day (BID) | ORAL | 3 refills | Status: DC

## 2024-02-03 NOTE — Telephone Encounter (Signed)
 Spoke to patient regarding proof of income for [employer]. Patient will either contact [employer] for [employer], or will provide 1040 to [facility] when able.

## 2024-02-17 DIAGNOSIS — Z419 Encounter for procedure for purposes other than remedying health state, unspecified: Secondary | ICD-10-CM | POA: Diagnosis not present

## 2024-02-25 ENCOUNTER — Telehealth (HOSPITAL_COMMUNITY): Payer: Self-pay | Admitting: Cardiology

## 2024-02-25 NOTE — Telephone Encounter (Signed)
 Called to confirm/remind patient of their appointment at [facility] on 02/25/2024.   Appointment:   [] Confirmed  [] Left mess   [x] No answer/No voice mail  [] VM Full/unable to leave message  [x] Phone not in service  Patient reminded to bring all medications and/or complete list.  Confirmed patient has transportation. Gave directions, instructed to utilize valet parking.

## 2024-02-26 ENCOUNTER — Encounter (HOSPITAL_COMMUNITY): Admitting: Cardiology

## 2024-03-19 DIAGNOSIS — Z419 Encounter for procedure for purposes other than remedying health state, unspecified: Secondary | ICD-10-CM | POA: Diagnosis not present

## 2024-04-03 NOTE — Progress Notes (Signed)
 PCP: Pcp, No Primary Cardiologist: Dr Rolan   HPI: Nathaniel Bur. is a 58 y.o. male with a history of HTN, HLD, and anemia. No known prior cardiac history. He had an uncle with heart failure.    Admitted 02/21/2019 with dyspnea and cough. CTA negative for PE, COVID-19 negative. Diagnosed with CHF, diuresed with IV lasix .  Echo showed newly reduced EF 20-25%, so HF team was consulted. He underwent RHC/LHC that showed preserved cardiac output, no CAD, and optimized filling pressures post-diuresis. HF meds started. Discharge weight 192.5 pounds.   Echo in 9/20 showed EF remains 25-30% with mild LV dilation.   Echo in 11/20 showed EF 40-45%, mild LVH, mildly decreased RV systolic function, mild-moderate MR.  Echo in 11/21 showed EF 55% with mild LVH, normal RV.   Last seen 10/2022. Echo 3/24 showed EF 55-60%, normal RV  Today he returns for HF follow up. Overall feeling fine. No SOB with activity. Denies palpitations, abnormal bleeding, CP, dizziness, edema, or PND/Orthopnea. Appetite ok. Not weighing at home. Taking all medications. No tobacco, ETOH, or drug use. Works in Scientist, clinical (histocompatibility and immunogenetics).   ECG (personally reviewed): NSR, LVH   Labs (4/24): K 3.4, creatinine 1.16 Labs (1/24): LDL 72  PMH: 1. Hyperlipidemia 2. HTN 3. Chronic systolic CHF: Nonischemic cardiomyopathy.  Echo (5/20) with EF 20-25%, normal RV.  - RHC/LHC (5/20): No significant coronary disease.  Mean RA 4, PA 29/17 mean 21, mean PCWP 10, CI 2.93.  - Cardiac MRI (5/20): Moderate LV dilation with diffuse, severe hypokinesis, EF 17%.  Normal RV size and systolic function, EF 46%. Moderate MR and moderate TR.  Uninterpretable delayed enhancement images.  - Echo (9/20): EF 25-30%, mild LV dilation, moderate MR, moderate LAE.  - Echo (11/20): EF 40-45%, mild LVH, mildly decreased RV systolic function, mild-moderate MR.  - CPX (11/20): peak VO2 23.7, VE/VCO2 slope 41, RER 1.1.  Mild-moderate HF limitation, restrictive  lung physiology likely due to body habitus.  - Echo (11/21): EF 55%, mild LVH, normal RV size and systolic function, PASP 32 mmHg.  - Echo (3/24): EF 55-60%, normal RV 4. Partial SBO: 6/21.  5. ABIs (9/21): Normal.   ROS: All systems negative except as listed in HPI, PMH and Problem List.  SH:  Social History   Socioeconomic History   Marital status: Married    Spouse name: Not on file   Number of children: Not on file   Years of education: Not on file   Highest education level: Not on file  Occupational History   Not on file  Tobacco Use   Smoking status: Never   Smokeless tobacco: Never   Tobacco comments:    Married, lives with spouse. Works as Pension scheme manager  Substance and Sexual Activity   Alcohol use: No   Drug use: No   Sexual activity: Yes  Other Topics Concern   Not on file  Social History Narrative   Not on file   Social Drivers of Health   Financial Resource Strain: Not on file  Food Insecurity: Not on file  Transportation Needs: Not on file  Physical Activity: Not on file  Stress: Not on file  Social Connections: Not on file  Intimate Partner Violence: Not on file   FH:  Family History  Problem Relation Age of Onset   Hypertension Paternal Grandfather    Current Outpatient Medications  Medication Sig Dispense Refill   aspirin  EC (ASPIRIN  LOW DOSE) 81 MG tablet Take 1 tablet (81  mg total) by mouth daily. 30 tablet 0   aspirin -acetaminophen -caffeine  (EXCEDRIN  MIGRAINE) 250-250-65 MG tablet Take 2 tablets by mouth every 6 (six) hours as needed for headache or migraine. 30 tablet 3   atorvastatin  (LIPITOR) 40 MG tablet Take 1 tablet (40 mg total) by mouth daily. 90 tablet 3   carvedilol  (COREG ) 25 MG tablet Take 1 tablet (25 mg total) by mouth 2 (two) times daily with a meal. 180 tablet 3   hydrALAZINE  (APRESOLINE ) 50 MG tablet Take 1.5 tablets (75 mg total) by mouth 3 (three) times daily. 405 tablet 3   isosorbide  mononitrate (IMDUR ) 60 MG 24 hr  tablet Take 1.5 tablets (90 mg total) by mouth daily. 135 tablet 3   Multiple Vitamins-Minerals (CENTRUM ADULT PO) Take 1 tablet by mouth daily.     sacubitril -valsartan  (ENTRESTO ) 97-103 MG Take 1 tablet by mouth 2 (two) times daily. 60 tablet 3   spironolactone  (ALDACTONE ) 25 MG tablet Take 1 tablet (25 mg total) by mouth daily. 90 tablet 3   No current facility-administered medications for this encounter.   Wt Readings from Last 3 Encounters:  04/06/24 85.1 kg (187 lb 9.6 oz)  11/23/22 78.5 kg (173 lb)  10/22/22 79.9 kg (176 lb 3.2 oz)   BP (!) 150/90   Pulse 77   Wt 85.1 kg (187 lb 9.6 oz)   SpO2 98%   BMI 28.52 kg/m   PHYSICAL EXAM: General:  NAD. No resp difficulty, walked into clinic HEENT: Normal Neck: Supple. No JVD. Cor: Regular rate & rhythm. No rubs, gallops or murmurs. Lungs: Clear Abdomen: Soft, nontender, nondistended.  Extremities: No cyanosis, clubbing, rash, edema Neuro: Alert & oriented x 3, moves all 4 extremities w/o difficulty. Affect pleasant.  ASSESSMENT & PLAN: 1. Chronic systolic CHF:  Nonischemic cardiomyopathy.  02/22/2019 Echo with EF 20-25%, diffuse hypokinesis, normal RV, moderate MR. Cause uncertain.  Does have history of HTN. Cath showed no significant coronary disease and preserved cardiac output. Uncle has CHF, no other family members.  No ETOH/drugs. TSH was ok. No definite pre-existing URI/viral symptoms.  Cardiac MRI in 5/20 showed LVEF 17%, RV normal. Unfortunately, delayed enhancement images were uninterpretable.  Echo in 9/20 showed EF 25-30%, echo in 11/20 showed EF back up to 40-45%, and echo 11/21 showed EF up to 55%. Echo 3/24 showed EF 55-60%, normal RV.  NYHA class I symptoms, not volume overloaded on exam.  - Continue Coreg  25 mg bid.   - Continue Entresto  97-103 mg bid - Continue spironolactone  25 mg daily. BMET today.  - Continue hydralazine  75 mg tid + Imdur  90 mg daily.  - He can stay off dapagliflozin  unless echo shows that EF has  declined.   - Repeat echo to make sure EF remains improved.   - Gifted a scale today. 2. HTN: BP elevated today, but out of several HF meds.    - Refill all cardiac meds.  3. Hyperlipidemia: Continue atorvastatin  40 mg daily. Check lipids today.   Given a list of PCPs today to establish care.  Follow up in 1 year with Dr. Rolan, if echo stable.  He does need to get BMET q3 months with his current meds.   Harlene HERO [facility] FNP-BC 04/06/2024

## 2024-04-04 ENCOUNTER — Other Ambulatory Visit (HOSPITAL_COMMUNITY): Payer: Self-pay | Admitting: Cardiology

## 2024-04-06 ENCOUNTER — Other Ambulatory Visit (HOSPITAL_COMMUNITY): Payer: Self-pay | Admitting: Family Medicine

## 2024-04-06 ENCOUNTER — Encounter (HOSPITAL_COMMUNITY): Payer: Self-pay

## 2024-04-06 ENCOUNTER — Ambulatory Visit (HOSPITAL_COMMUNITY)
Admission: RE | Admit: 2024-04-06 | Discharge: 2024-04-06 | Disposition: A | Discharge status: Home / Self Care | Source: Ambulatory Visit | Attending: Family Medicine | Admitting: Family Medicine

## 2024-04-06 VITALS — BP 150/90 | HR 77 | Wt 187.6 lb

## 2024-04-06 DIAGNOSIS — I10 Essential (primary) hypertension: Secondary | ICD-10-CM

## 2024-04-06 DIAGNOSIS — I428 Other cardiomyopathies: Secondary | ICD-10-CM | POA: Insufficient documentation

## 2024-04-06 DIAGNOSIS — I110 Hypertensive heart disease with heart failure: Secondary | ICD-10-CM | POA: Insufficient documentation

## 2024-04-06 DIAGNOSIS — Z79899 Other long term (current) drug therapy: Secondary | ICD-10-CM | POA: Insufficient documentation

## 2024-04-06 DIAGNOSIS — E785 Hyperlipidemia, unspecified: Secondary | ICD-10-CM | POA: Insufficient documentation

## 2024-04-06 DIAGNOSIS — I5022 Chronic systolic (congestive) heart failure: Secondary | ICD-10-CM | POA: Diagnosis not present

## 2024-04-06 LAB — COMPREHENSIVE METABOLIC PANEL WITH GFR
ALT: 19 U/L (ref 0–44)
AST: 22 U/L (ref 15–41)
Albumin: 4 g/dL (ref 3.5–5.0)
Alkaline Phosphatase: 39 U/L (ref 38–126)
Anion gap: 8 (ref 5–15)
BUN: 14 mg/dL (ref 6–20)
CO2: 24 mmol/L (ref 22–32)
Calcium: 8.8 mg/dL — ABNORMAL LOW (ref 8.9–10.3)
Chloride: 103 mmol/L (ref 98–111)
Creatinine, Ser: 1.27 mg/dL — ABNORMAL HIGH (ref 0.61–1.24)
GFR, Estimated: >60 mL/min (ref >60)
Glucose, Bld: 87 mg/dL (ref 70–99)
Potassium: 3.5 mmol/L (ref 3.5–5.1)
Sodium: 135 mmol/L (ref 135–145)
Total Bilirubin: 1.6 mg/dL — ABNORMAL HIGH (ref 0.0–1.2)
Total Protein: 6.9 g/dL (ref 6.5–8.1)

## 2024-04-06 LAB — LIPID PANEL
Cholesterol: 130 mg/dL (ref 0–200)
HDL: 61 mg/dL (ref >40)
LDL Cholesterol: 55 mg/dL (ref 0–99)
Total CHOL/HDL Ratio: 2.1 ratio
Triglycerides: 68 mg/dL (ref <150)
VLDL: 14 mg/dL (ref 0–40)

## 2024-04-06 MED ORDER — CARVEDILOL 25 MG PO TABS: 25.0000 mg | ORAL_TABLET | Freq: Two times a day (BID) | ORAL | 3 refills | Status: AC

## 2024-04-06 MED ORDER — ISOSORBIDE MONONITRATE ER 60 MG PO TB24: 90.0000 mg | ORAL_TABLET | Freq: Every day | ORAL | 3 refills | Status: AC

## 2024-04-06 MED ORDER — HYDRALAZINE HCL 50 MG PO TABS: 75.0000 mg | ORAL_TABLET | Freq: Three times a day (TID) | ORAL | 3 refills | Status: AC

## 2024-04-06 MED ORDER — ATORVASTATIN CALCIUM 40 MG PO TABS: 40.0000 mg | ORAL_TABLET | Freq: Every day | ORAL | 3 refills | Status: AC

## 2024-04-06 MED ORDER — SPIRONOLACTONE 25 MG PO TABS: 25.0000 mg | ORAL_TABLET | Freq: Every day | ORAL | 3 refills | Status: DC

## 2024-04-06 NOTE — Patient Instructions (Signed)
 No medication changes today. Refills sent on all cardiac medications - see below. Labs today - will call you if abnormal. Please repeat lab in 3 months - see below. You will need labs every three months - see below.  Echo has been ordered for you. We will call you to schedule after we obtain prior authorization from your insurance company. Return to see Dr. Rolan in 1 year. PLEASE CALL [phone] IN JUNE 2026 TO SCHEDULE THIS APPOINTMENT.  Provided patient with scales today. Provided patient with list of available PCPs at today's visit. Asked patient to establish care with Primary Care Physician. Please call us  at [phone] if any questions or concerns prior to your next visit.

## 2024-04-07 ENCOUNTER — Ambulatory Visit (HOSPITAL_COMMUNITY): Payer: Self-pay | Admitting: Family Medicine

## 2024-04-07 DIAGNOSIS — I5022 Chronic systolic (congestive) heart failure: Secondary | ICD-10-CM

## 2024-04-14 NOTE — Telephone Encounter (Signed)
 Called patient per Nathaniel Gainer, NP with following lab results and instructions:  Good lipids, Tbili mildly elevated. Needs to follow up with PCP for monitoring.  Pt has not obtained a PCP yet, but will use his provided list to call and establish care.

## 2024-04-18 DIAGNOSIS — Z419 Encounter for procedure for purposes other than remedying health state, unspecified: Secondary | ICD-10-CM | POA: Diagnosis not present

## 2024-05-07 ENCOUNTER — Other Ambulatory Visit (HOSPITAL_COMMUNITY): Payer: Self-pay | Admitting: Cardiology

## 2024-05-07 ENCOUNTER — Ambulatory Visit (HOSPITAL_COMMUNITY)
Admission: RE | Admit: 2024-05-07 | Discharge: 2024-05-07 | Disposition: A | Discharge status: Home / Self Care | Source: Ambulatory Visit | Attending: Family Medicine | Admitting: Family Medicine

## 2024-05-07 DIAGNOSIS — I5022 Chronic systolic (congestive) heart failure: Secondary | ICD-10-CM

## 2024-05-07 MED ORDER — SACUBITRIL-VALSARTAN 97-103 MG PO TABS: 1.0000 | ORAL_TABLET | Freq: Two times a day (BID) | ORAL | 11 refills | Status: AC

## 2024-05-08 ENCOUNTER — Telehealth (HOSPITAL_COMMUNITY): Payer: Self-pay | Admitting: Cardiology

## 2024-05-08 NOTE — Telephone Encounter (Signed)
 Patient called-reports during OV provider mentioned RX for nicotine gum and or patches.  Advised would follow up with provider if RX is needed or ok to get OTC  Pt said ok to send reply via mychart

## 2024-05-12 NOTE — Telephone Encounter (Signed)
 Orders should be in now, thanks

## 2024-05-19 DIAGNOSIS — Z419 Encounter for procedure for purposes other than remedying health state, unspecified: Secondary | ICD-10-CM | POA: Diagnosis not present

## 2024-05-20 ENCOUNTER — Telehealth (HOSPITAL_COMMUNITY): Payer: Self-pay

## 2024-05-20 NOTE — Telephone Encounter (Signed)
 Received a fax requesting medical records from [employer]. Records were successfully faxed to: [phone] ,which was the number provided.. Medical request form will be scanned into patients chart.  Phone number: [phone]

## 2024-06-05 ENCOUNTER — Other Ambulatory Visit (HOSPITAL_COMMUNITY): Payer: Self-pay

## 2024-06-05 ENCOUNTER — Telehealth (HOSPITAL_COMMUNITY): Payer: Self-pay

## 2024-06-05 NOTE — Telephone Encounter (Signed)
 [facility] Patient Advocate Encounter  Received notification that prior authorization is needed for Entresto . Test billing indicates that this plan prefers brand name (DAW [id number]). Contacted pharmacy to reprocess, confirmed $4 copay. No prior auth submitted at this time.  Rachel DEL, CPhT Rx Patient Advocate Phone: [phone]

## 2024-06-09 ENCOUNTER — Ambulatory Visit (HOSPITAL_COMMUNITY)
Admission: RE | Admit: 2024-06-09 | Discharge: 2024-06-09 | Disposition: A | Discharge status: Home / Self Care | Source: Ambulatory Visit | Attending: Cardiology | Admitting: Cardiology

## 2024-06-09 DIAGNOSIS — I5022 Chronic systolic (congestive) heart failure: Secondary | ICD-10-CM | POA: Insufficient documentation

## 2024-06-09 LAB — COMPREHENSIVE METABOLIC PANEL WITH GFR
ALT: 18 U/L (ref 0–44)
AST: 21 U/L (ref 15–41)
Albumin: 4.2 g/dL (ref 3.5–5.0)
Alkaline Phosphatase: 47 U/L (ref 38–126)
Anion gap: 12 (ref 5–15)
BUN: 12 mg/dL (ref 6–20)
CO2: 24 mmol/L (ref 22–32)
Calcium: 9.4 mg/dL (ref 8.9–10.3)
Chloride: 107 mmol/L (ref 98–111)
Creatinine, Ser: 1.16 mg/dL (ref 0.61–1.24)
GFR, Estimated: >60 mL/min (ref >60)
Glucose, Bld: 99 mg/dL (ref 70–99)
Potassium: 3.4 mmol/L — ABNORMAL LOW (ref 3.5–5.1)
Sodium: 143 mmol/L (ref 135–145)
Total Bilirubin: 1.2 mg/dL (ref 0.0–1.2)
Total Protein: 7.3 g/dL (ref 6.5–8.1)

## 2024-06-10 ENCOUNTER — Telehealth (HOSPITAL_COMMUNITY): Payer: Self-pay | Admitting: Cardiology

## 2024-06-10 ENCOUNTER — Ambulatory Visit (HOSPITAL_COMMUNITY): Payer: Self-pay | Admitting: Family Medicine

## 2024-06-10 DIAGNOSIS — I5022 Chronic systolic (congestive) heart failure: Secondary | ICD-10-CM

## 2024-06-10 NOTE — Telephone Encounter (Signed)
 Triage walk in  Pt presented to front office with concerns after lab appt 9/2, pt completed walk in form as follows: I just wanted to know if / have been having a small modified cough, like pepper in my throat, should I be concerned with my heart issue?   Returned call to patient at [phone] Reports cough that started 2 weeks ago Denies fever or congestion Weight is 183, reports weight was 170 at surgical procedure weeks ago Denies SOB, CP, or swelling  Pt would like to know if cough is related to cardiac meds or heart failure

## 2024-06-10 NOTE — Telephone Encounter (Signed)
 Patient called. Labs placed and appointment scheduled. Pt aware, agreeable, and verbalized understanding.

## 2024-06-10 NOTE — Telephone Encounter (Signed)
-----   Message from Harlene CHRISTELLA Gainer sent at 06/10/2024  8:17 AM EDT ----- K a little low. Increase K-rich foods in diet.   Repeat bmet in 2 weeks to ensure K stable ----- Message ----- From: Interface, Lab In [place] Sent: 06/09/2024   4:40 PM EDT To: Harlene CHRISTELLA Gainer, FNP

## 2024-06-16 ENCOUNTER — Telehealth (HOSPITAL_COMMUNITY): Payer: Self-pay

## 2024-06-16 NOTE — Telephone Encounter (Signed)
 Called to confirm/remind patient of their Lab appointment at [facility] on 06/22/24.   Appointment:   [] Confirmed  [x] Left mess   [] No answer/No voice mail  [] VM Full/unable to leave message  [] Phone not in service

## 2024-06-16 NOTE — Telephone Encounter (Signed)
 Patient had requested for a appointment reminder card to be mailed for his upcoming lab appointments. In addition, has been placed at the front desk  to be mailed.

## 2024-06-17 ENCOUNTER — Telehealth (HOSPITAL_COMMUNITY): Payer: Self-pay

## 2024-06-19 DIAGNOSIS — Z419 Encounter for procedure for purposes other than remedying health state, unspecified: Secondary | ICD-10-CM | POA: Diagnosis not present

## 2024-06-22 ENCOUNTER — Other Ambulatory Visit (HOSPITAL_COMMUNITY)

## 2024-07-04 LAB — ECHOCARDIOGRAM COMPLETE
Area-P 1/2: 6.37 cm2
Calc EF: 42.5 %
S' Lateral: 4 cm
Single Plane A2C EF: 35.9 %
Single Plane A4C EF: 49.8 %

## 2024-07-06 ENCOUNTER — Ambulatory Visit (HOSPITAL_COMMUNITY)
Admission: RE | Admit: 2024-07-06 | Discharge: 2024-07-06 | Disposition: A | Discharge status: Home / Self Care | Source: Ambulatory Visit | Attending: Cardiology | Admitting: Cardiology

## 2024-07-06 DIAGNOSIS — I5022 Chronic systolic (congestive) heart failure: Secondary | ICD-10-CM | POA: Insufficient documentation

## 2024-07-06 LAB — BASIC METABOLIC PANEL WITH GFR
Anion gap: 9 (ref 5–15)
BUN: 12 mg/dL (ref 6–20)
CO2: 23 mmol/L (ref 22–32)
Calcium: 9 mg/dL (ref 8.9–10.3)
Chloride: 104 mmol/L (ref 98–111)
Creatinine, Ser: 1.2 mg/dL (ref 0.61–1.24)
GFR, Estimated: >60 mL/min (ref >60)
Glucose, Bld: 98 mg/dL (ref 70–99)
Potassium: 3.4 mmol/L — ABNORMAL LOW (ref 3.5–5.1)
Sodium: 136 mmol/L (ref 135–145)

## 2024-07-07 ENCOUNTER — Ambulatory Visit (HOSPITAL_COMMUNITY): Payer: Self-pay | Admitting: Family Medicine

## 2024-07-07 DIAGNOSIS — I5022 Chronic systolic (congestive) heart failure: Secondary | ICD-10-CM

## 2024-07-07 MED ORDER — POTASSIUM CHLORIDE ER 10 MEQ PO TBCR: 10.0000 meq | EXTENDED_RELEASE_TABLET | Freq: Every day | ORAL | 3 refills | Status: AC

## 2024-07-08 ENCOUNTER — Telehealth (HOSPITAL_COMMUNITY): Payer: Self-pay | Admitting: Cardiology

## 2024-07-08 DIAGNOSIS — I5022 Chronic systolic (congestive) heart failure: Secondary | ICD-10-CM

## 2024-07-08 NOTE — Telephone Encounter (Signed)
 Pt aware Will add BNP to up coming labs

## 2024-07-08 NOTE — Telephone Encounter (Signed)
 Walk in form completed by patient with the following:  I needed to ask a question that I referenced with my last visit and didn't get a response. Ive been experiencing an on (again) off (again) recurring cough. I wanted to know if my heart condition could produce this? Hot to remedy this?   Please advise

## 2024-07-19 DIAGNOSIS — Z419 Encounter for procedure for purposes other than remedying health state, unspecified: Secondary | ICD-10-CM | POA: Diagnosis not present

## 2024-07-27 ENCOUNTER — Ambulatory Visit (HOSPITAL_COMMUNITY): Payer: Self-pay | Admitting: Family Medicine

## 2024-07-27 ENCOUNTER — Ambulatory Visit (HOSPITAL_COMMUNITY)
Admission: RE | Admit: 2024-07-27 | Discharge: 2024-07-27 | Disposition: A | Discharge status: Home / Self Care | Source: Ambulatory Visit | Attending: Cardiology | Admitting: Cardiology

## 2024-07-27 DIAGNOSIS — I5022 Chronic systolic (congestive) heart failure: Secondary | ICD-10-CM | POA: Insufficient documentation

## 2024-07-27 LAB — BASIC METABOLIC PANEL WITH GFR
Anion gap: 10 (ref 5–15)
BUN: 12 mg/dL (ref 6–20)
CO2: 25 mmol/L (ref 22–32)
Calcium: 9.2 mg/dL (ref 8.9–10.3)
Chloride: 104 mmol/L (ref 98–111)
Creatinine, Ser: 1.19 mg/dL (ref 0.61–1.24)
GFR, Estimated: >60 mL/min (ref >60)
Glucose, Bld: 84 mg/dL (ref 70–99)
Potassium: 3.7 mmol/L (ref 3.5–5.1)
Sodium: 139 mmol/L (ref 135–145)

## 2024-08-19 DIAGNOSIS — Z419 Encounter for procedure for purposes other than remedying health state, unspecified: Secondary | ICD-10-CM | POA: Diagnosis not present

## 2024-09-07 ENCOUNTER — Ambulatory Visit (HOSPITAL_COMMUNITY)
Admission: RE | Admit: 2024-09-07 | Discharge: 2024-09-07 | Disposition: A | Source: Ambulatory Visit | Attending: Cardiology

## 2024-09-07 DIAGNOSIS — I5022 Chronic systolic (congestive) heart failure: Secondary | ICD-10-CM

## 2024-09-18 DIAGNOSIS — Z419 Encounter for procedure for purposes other than remedying health state, unspecified: Secondary | ICD-10-CM | POA: Diagnosis not present
